# Patient Record
Sex: Female | Born: 1960 | Race: Black or African American | Hispanic: No | Marital: Married | Smoking: Never smoker
Health system: Southern US, Community
[De-identification: ages and names within clinical notes are randomized; demographics above are authoritative.]

## PROBLEM LIST (undated history)

## (undated) DIAGNOSIS — R1110 Vomiting, unspecified: Secondary | ICD-10-CM

## (undated) DIAGNOSIS — I10 Essential (primary) hypertension: Secondary | ICD-10-CM

## (undated) DIAGNOSIS — R634 Abnormal weight loss: Secondary | ICD-10-CM

## (undated) DIAGNOSIS — R531 Weakness: Secondary | ICD-10-CM

## (undated) DIAGNOSIS — E538 Deficiency of other specified B group vitamins: Secondary | ICD-10-CM

## (undated) DIAGNOSIS — E785 Hyperlipidemia, unspecified: Secondary | ICD-10-CM

## (undated) DIAGNOSIS — K8050 Calculus of bile duct without cholangitis or cholecystitis without obstruction: Secondary | ICD-10-CM

## (undated) DIAGNOSIS — D510 Vitamin B12 deficiency anemia due to intrinsic factor deficiency: Secondary | ICD-10-CM

## (undated) DIAGNOSIS — K5900 Constipation, unspecified: Secondary | ICD-10-CM

## (undated) DIAGNOSIS — D649 Anemia, unspecified: Secondary | ICD-10-CM

## (undated) DIAGNOSIS — R109 Unspecified abdominal pain: Secondary | ICD-10-CM

## (undated) HISTORY — DX: Unspecified abdominal pain: R10.9

## (undated) HISTORY — DX: Weakness: R53.1

## (undated) HISTORY — DX: Vomiting, unspecified: R11.10

## (undated) HISTORY — DX: Abnormal weight loss: R63.4

## (undated) HISTORY — DX: Hyperlipidemia, unspecified: E78.5

## (undated) HISTORY — DX: Anemia, unspecified: D64.9

## (undated) HISTORY — PX: WISDOM TOOTH EXTRACTION: SHX21

## (undated) HISTORY — DX: Vitamin B12 deficiency anemia due to intrinsic factor deficiency: D51.0

## (undated) HISTORY — DX: Deficiency of other specified B group vitamins: E53.8

## (undated) HISTORY — DX: Constipation, unspecified: K59.00

## (undated) HISTORY — DX: Calculus of bile duct without cholangitis or cholecystitis without obstruction: K80.50

---

## 1987-04-04 HISTORY — PX: MOUTH SURGERY: SHX715

## 2000-11-08 ENCOUNTER — Encounter: Payer: Self-pay | Admitting: Obstetrics and Gynecology

## 2000-11-08 ENCOUNTER — Encounter: Admission: RE | Admit: 2000-11-08 | Discharge: 2000-11-08 | Payer: Self-pay | Admitting: Obstetrics and Gynecology

## 2000-11-13 HISTORY — PX: KNEE SURGERY: SHX244

## 2000-11-30 ENCOUNTER — Encounter (INDEPENDENT_AMBULATORY_CARE_PROVIDER_SITE_OTHER): Payer: Self-pay | Admitting: Specialist

## 2000-11-30 ENCOUNTER — Other Ambulatory Visit: Admission: RE | Admit: 2000-11-30 | Discharge: 2000-11-30 | Payer: Self-pay | Admitting: Obstetrics and Gynecology

## 2000-12-02 ENCOUNTER — Other Ambulatory Visit: Admission: RE | Admit: 2000-12-02 | Discharge: 2000-12-02 | Payer: Self-pay | Admitting: Orthopaedic Surgery

## 2000-12-28 ENCOUNTER — Encounter: Admission: RE | Admit: 2000-12-28 | Discharge: 2001-03-28 | Payer: Self-pay | Admitting: *Deleted

## 2003-10-12 ENCOUNTER — Other Ambulatory Visit: Admission: RE | Admit: 2003-10-12 | Discharge: 2003-10-12 | Payer: Self-pay | Admitting: Internal Medicine

## 2004-08-07 ENCOUNTER — Encounter: Admission: RE | Admit: 2004-08-07 | Discharge: 2004-08-07 | Payer: Self-pay | Admitting: Internal Medicine

## 2004-09-01 ENCOUNTER — Ambulatory Visit (HOSPITAL_COMMUNITY): Admission: RE | Admit: 2004-09-01 | Discharge: 2004-09-01 | Payer: Self-pay | Admitting: Gastroenterology

## 2004-09-01 ENCOUNTER — Encounter (INDEPENDENT_AMBULATORY_CARE_PROVIDER_SITE_OTHER): Payer: Self-pay | Admitting: *Deleted

## 2004-09-01 HISTORY — PX: COLONOSCOPY: SHX174

## 2004-09-01 HISTORY — PX: UPPER GASTROINTESTINAL ENDOSCOPY: SHX188

## 2004-10-20 ENCOUNTER — Other Ambulatory Visit: Admission: RE | Admit: 2004-10-20 | Discharge: 2004-10-20 | Payer: Self-pay | Admitting: Internal Medicine

## 2004-12-03 ENCOUNTER — Encounter: Admission: RE | Admit: 2004-12-03 | Discharge: 2004-12-03 | Payer: Self-pay | Admitting: Internal Medicine

## 2005-05-26 ENCOUNTER — Emergency Department (HOSPITAL_COMMUNITY): Admission: EM | Admit: 2005-05-26 | Discharge: 2005-05-26 | Payer: Self-pay | Admitting: Emergency Medicine

## 2007-12-01 ENCOUNTER — Encounter: Admission: RE | Admit: 2007-12-01 | Discharge: 2007-12-01 | Payer: Self-pay | Admitting: Family Medicine

## 2010-10-31 NOTE — Op Note (Signed)
NAMETANAJA, GANGER              ACCOUNT NO.:  [id number]   MEDICAL RECORD NO.:  [id number]          PATIENT TYPE:  AMB   LOCATION:  ENDO                         FACILITY:  [facility]   PHYSICIAN:  Danise Edge, M.D.   DATE OF BIRTH:  1961/02/10   DATE OF PROCEDURE:  09/01/2004  DATE OF DISCHARGE:                                 OPERATIVE REPORT   PROCEDURE:  Esophagogastroduodenoscopy, gastric polyp biopsy, small bowel  biopsy and colonoscopy.   PROCEDURE INDICATIONS:  Ms. Vaanya Shambaugh is a 50 year old female born  10-24-1960.  Ms. Leavy has unexplained chronic iron-deficiency  anemia.   Ms. Latif was first diagnosed with iron deficiency anemia and she was 50  years old and tried to give blood to the [employer].  At times she  feels as though she has low energy.  She began having heavier menses after  her first child the reports no abnormal menses.  She denies abdominal pain  or gastrointestinal bleeding.  Despite taking iron, her hemoglobin has not  risen significantly.   On June 27, 2004, white blood cell count 6800, hemoglobin 9.6 g,  normocytic indices and normal platelet count.   MEDICATION ALLERGIES:  PENICILLIN.   CHRONIC MEDICATIONS:  Metformin, Bayer aspirin, lisinopril, iron with  vitamin C, Prilosec.   PAST MEDICAL HISTORY:  Type 2 diabetes mellitus, chronic iron-deficiency  anemia, two cesarean sections, right knee surgery.   BLOOD TRANSFUSIONS:  1988.   ASSESSMENT:  Chronic iron-deficiency anemia in a menstruating female.  The  patient is scheduled to undergo esophagogastroduodenoscopy with small bowel  biopsy to rule out celiac sprue and colonoscopy with polypectomy to prevent  colon cancer.   ENDOSCOPIST:  Danise Edge, M.D.   PREMEDICATION:  Versed 8.5 mg, Demerol 70 mg.   PROCEDURE:  Esophagogastroduodenoscopy.   After obtaining informed consent, Ms. Acero was placed in the left lateral  decubitus position.  I administered  intravenous Demerol and intravenous  Versed to achieve conscious sedation for the procedure.  The patient's blood  pressure, oxygen saturation and cardiac rhythm were monitored throughout the  procedure and documented in the medical record.   The Olympus gastroscope was passed through the posterior hypopharynx into  the proximal esophagus without difficulty.  The hypopharynx, larynx and  vocal cords appeared normal.   Esophagoscopy:  The proximal, mid and lower segments of the esophageal  mucosa appear normal.  The squamocolumnar junction and esophagogastric junction are noted at 40 cm  from the incisor teeth.   Gastroscopy:  Retroflexed view of the gastric fundus was normal.  In the  gastric cardia, there is a 3-mm polyp, which was biopsied.  The gastric  body, antrum and pylorus appear normal.   Duodenoscopy:  The duodenal bulb, second portion and third portion of  duodenum normal.  Five biopsies were taken from the second-third portions of  the duodenum to rule out celiac sprue.   ASSESSMENT:  1.  A small polyp in the gastric cardia was biopsied.  2.  Small bowel biopsies are pending.   PROCEDURE:  Screening colonoscopy.   Anal inspection and  digital rectal exam were normal.  The Olympus adjustable  pediatric colonoscope was introduced into the rectum and advanced to the  cecum.  Colonic preparation for the exam today was excellent.   Rectum normal.  Sigmoid colon and descending colon normal.  Splenic flexure normal.  Transverse colon normal.  Hepatic flexure normal.  Ascending colon normal.  Cecum and ileocecal valve normal.   ASSESSMENT:  Normal proctocolonoscopy to the cecum.      MJ/MEDQ  D:  09/01/2004  T:  09/01/2004  Job:  [id number]   cc:   Marcene Duos, M.D.  [address]  [place]  [place] [address]  Fax: [phone]

## 2011-01-30 ENCOUNTER — Other Ambulatory Visit: Payer: Self-pay | Admitting: Family Medicine

## 2011-01-30 DIAGNOSIS — Z1231 Encounter for screening mammogram for malignant neoplasm of breast: Secondary | ICD-10-CM

## 2011-02-02 ENCOUNTER — Ambulatory Visit
Admission: RE | Admit: 2011-02-02 | Discharge: 2011-02-02 | Disposition: A | Discharge status: Home / Self Care | Payer: BC Managed Care – PPO | Source: Ambulatory Visit | Attending: Family Medicine | Admitting: Family Medicine

## 2011-02-02 DIAGNOSIS — Z1231 Encounter for screening mammogram for malignant neoplasm of breast: Secondary | ICD-10-CM

## 2011-11-19 ENCOUNTER — Encounter (HOSPITAL_COMMUNITY): Payer: Self-pay | Admitting: Family Medicine

## 2011-11-19 ENCOUNTER — Emergency Department (HOSPITAL_COMMUNITY)
Admission: EM | Admit: 2011-11-19 | Discharge: 2011-11-20 | Disposition: A | Discharge status: Home / Self Care | Payer: BC Managed Care – PPO | Attending: Emergency Medicine | Admitting: Emergency Medicine

## 2011-11-19 ENCOUNTER — Emergency Department (HOSPITAL_COMMUNITY): Payer: BC Managed Care – PPO

## 2011-11-19 DIAGNOSIS — R197 Diarrhea, unspecified: Secondary | ICD-10-CM | POA: Insufficient documentation

## 2011-11-19 DIAGNOSIS — E119 Type 2 diabetes mellitus without complications: Secondary | ICD-10-CM | POA: Insufficient documentation

## 2011-11-19 DIAGNOSIS — R109 Unspecified abdominal pain: Secondary | ICD-10-CM

## 2011-11-19 DIAGNOSIS — R110 Nausea: Secondary | ICD-10-CM | POA: Insufficient documentation

## 2011-11-19 DIAGNOSIS — Z79899 Other long term (current) drug therapy: Secondary | ICD-10-CM | POA: Insufficient documentation

## 2011-11-19 DIAGNOSIS — R739 Hyperglycemia, unspecified: Secondary | ICD-10-CM

## 2011-11-19 DIAGNOSIS — I10 Essential (primary) hypertension: Secondary | ICD-10-CM | POA: Insufficient documentation

## 2011-11-19 HISTORY — DX: Essential (primary) hypertension: I10

## 2011-11-19 LAB — DIFFERENTIAL
Basophils Absolute: 0 10*3/uL (ref 0.0–0.1)
Basophils Relative: 0 % (ref 0–1)
Eosinophils Absolute: 0 10*3/uL (ref 0.0–0.7)
Eosinophils Relative: 0 % (ref 0–5)
Lymphocytes Relative: 9 % — ABNORMAL LOW (ref 12–46)

## 2011-11-19 LAB — COMPREHENSIVE METABOLIC PANEL
ALT: 9 U/L (ref 0–35)
AST: 15 U/L (ref 0–37)
Albumin: 4 g/dL (ref 3.5–5.2)
CO2: 26 mEq/L (ref 19–32)
Calcium: 9.8 mg/dL (ref 8.4–10.5)
Sodium: 132 mEq/L — ABNORMAL LOW (ref 135–145)
Total Protein: 8.3 g/dL (ref 6.0–8.3)

## 2011-11-19 LAB — URINE MICROSCOPIC-ADD ON

## 2011-11-19 LAB — URINALYSIS, ROUTINE W REFLEX MICROSCOPIC
Glucose, UA: >1000 mg/dL — AB
Hgb urine dipstick: NEGATIVE
Leukocytes, UA: NEGATIVE
Specific Gravity, Urine: 1.034 — ABNORMAL HIGH (ref 1.005–1.030)
pH: 7.5 (ref 5.0–8.0)

## 2011-11-19 LAB — CBC
MCHC: 33.6 g/dL (ref 30.0–36.0)
MCV: 85.5 fL (ref 78.0–100.0)
Platelets: 271 10*3/uL (ref 150–400)
RDW: 13.8 % (ref 11.5–15.5)
WBC: 7.1 10*3/uL (ref 4.0–10.5)

## 2011-11-19 MED ORDER — HYDROMORPHONE HCL PF 1 MG/ML IJ SOLN
0.5000 mg | Freq: Once | INTRAMUSCULAR | Status: AC
  Administered 2011-11-19: 1 mg via INTRAVENOUS
  Filled 2011-11-19: qty 1

## 2011-11-19 MED ORDER — ONDANSETRON 8 MG PO TBDP: ORAL_TABLET | ORAL | Status: AC

## 2011-11-19 MED ORDER — OXYCODONE-ACETAMINOPHEN 5-325 MG PO TABS: 1.0000 | ORAL_TABLET | Freq: Four times a day (QID) | ORAL | Status: AC | PRN

## 2011-11-19 MED ORDER — SODIUM CHLORIDE 0.9 % IV SOLN: INTRAVENOUS | Status: DC

## 2011-11-19 MED ORDER — ONDANSETRON HCL 4 MG/2ML IJ SOLN
4.0000 mg | Freq: Once | INTRAMUSCULAR | Status: AC
  Administered 2011-11-19: 4 mg via INTRAVENOUS
  Filled 2011-11-19: qty 2

## 2011-11-19 MED ORDER — SODIUM CHLORIDE 0.9 % IV BOLUS (SEPSIS)
1000.0000 mL | Freq: Once | INTRAVENOUS | Status: AC
  Administered 2011-11-19: 1000 mL via INTRAVENOUS

## 2011-11-19 NOTE — ED Notes (Signed)
Pt reports having upper abdominal pain starting after she ate last night. States she had some diarrhea last night and then the pain stopped around 23:00 last night. Reports she went to work today and ate lunch and the pain started after eating again.  Reports some nausea but no vomiting.

## 2011-11-19 NOTE — Discharge Instructions (Signed)
Hyperglycemia Hyperglycemia occurs when the glucose (sugar) in your blood is too high. Hyperglycemia can happen for many reasons, but it most often happens to people who do not know they have diabetes or are not managing their diabetes properly.  CAUSES  Whether you have diabetes or not, there are other causes of hyperglycemia. Hyperglycemia can occur when you have diabetes, but it can also occur in other situations that you might not be as aware of, such as: Diabetes  If you have diabetes and are having problems controlling your blood glucose, hyperglycemia could occur because of some of the following reasons:   Not following your meal plan.   Not taking your diabetes medications or not taking it properly.   Exercising less or doing less activity than you normally do.   Being sick.  Pre-diabetes  This cannot be ignored. Before people develop Type 2 diabetes, they almost always have "pre-diabetes." This is when your blood glucose levels are higher than normal, but not yet high enough to be diagnosed as diabetes. Research has shown that some long-term damage to the body, especially the heart and circulatory system, may already be occurring during pre-diabetes. If you take action to manage your blood glucose when you have pre-diabetes, you may delay or prevent Type 2 diabetes from developing.  Stress  If you have diabetes, you may be "diet" controlled or on oral medications or insulin to control your diabetes. However, you may find that your blood glucose is higher than usual in the hospital whether you have diabetes or not. This is often referred to as "stress hyperglycemia." Stress can elevate your blood glucose. This happens because of hormones put out by the body during times of stress. If stress has been the cause of your high blood glucose, it can be followed regularly by your caregiver. That way he/she can make sure your hyperglycemia does not continue to get worse or progress to diabetes.    Steroids  Steroids are medications that act on the infection fighting system (immune system) to block inflammation or infection. One side effect can be a rise in blood glucose. Most people can produce enough extra insulin to allow for this rise, but for those who cannot, steroids make blood glucose levels go even higher. It is not unusual for steroid treatments to "uncover" diabetes that is developing. It is not always possible to determine if the hyperglycemia will go away after the steroids are stopped. A special blood test called an A1c is sometimes done to determine if your blood glucose was elevated before the steroids were started.  SYMPTOMS  Thirsty.   Frequent urination.   Dry mouth.   Blurred vision.   Tired or fatigue.   Weakness.   Sleepy.   Tingling in feet or leg.  DIAGNOSIS  Diagnosis is made by monitoring blood glucose in one or all of the following ways:  A1c test. This is a chemical found in your blood.   Fingerstick blood glucose monitoring.   Laboratory results.  TREATMENT  First, knowing the cause of the hyperglycemia is important before the hyperglycemia can be treated. Treatment may include, but is not be limited to:  Education.   Change or adjustment in medications.   Change or adjustment in meal plan.   Treatment for an illness, infection, etc.   More frequent blood glucose monitoring.   Change in exercise plan.   Decreasing or stopping steroids.   Lifestyle changes.  HOME CARE INSTRUCTIONS   Test your blood glucose  as directed.   Exercise regularly. Your caregiver will give you instructions about exercise. Pre-diabetes or diabetes which comes on with stress is helped by exercising.   Eat wholesome, balanced meals. Eat often and at regular, fixed times. Your caregiver or nutritionist will give you a meal plan to guide your sugar intake.   Being at an ideal weight is important. If needed, losing as little as 10 to 15 pounds may help  improve blood glucose levels.  SEEK MEDICAL CARE IF:   You have questions about medicine, activity, or diet.   You continue to have symptoms (problems such as increased thirst, urination, or weight gain).  SEEK IMMEDIATE MEDICAL CARE IF:   You are vomiting or have diarrhea.   Your breath smells fruity.   You are breathing faster or slower.   You are very sleepy or incoherent.   You have numbness, tingling, or pain in your feet or hands.   You have chest pain.   Your symptoms get worse even though you have been following your caregiver's orders.   If you have any other questions or concerns.  Document Released: 11/25/2000 Document Revised: 05/21/2011 Document Reviewed: 01/21/2009 [facility] Patient Information 2012 [place], [place].  Your glucose was elevated.  This will need to be followed up by her primary care Dr. medications for pain and nausea. Clear liquids for next 10 hour

## 2011-11-19 NOTE — ED Provider Notes (Signed)
History     CSN: [id number]  Arrival date & time 11/19/11  4098   First MD Initiated Contact with Patient 11/19/11 1855      Chief Complaint  Patient presents with  . Abdominal Pain  . Diarrhea  . Nausea    (Consider location/radiation/quality/duration/timing/severity/associated sxs/prior treatment) HPI... upper abdominal pain after eating last night with associated nausea and diarrhea. No fever or chills or dysuria.  Does not normally complain of abdominal pain. Feeling better now. Nothing makes it better or worse.  Described as cramping.. mild to moderate in intensity.  Remote history of diabetes.  Has not been on meds for 3 years  Past Medical History  Diagnosis Date  . Hypertension   . Diabetes mellitus 12 yrs ago     Past Surgical History  Procedure Date  . Cesarean section   . Wisdom tooth extraction     History reviewed. No pertinent family history.  History  Substance Use Topics  . Smoking status: Never Smoker   . Smokeless tobacco: Not on file  . Alcohol Use: No    OB History    Grav Para Term Preterm Abortions TAB SAB Ect Mult Living                  Review of Systems  All other systems reviewed and are negative.    Allergies  Penicillins  Home Medications   Current Outpatient Rx  Name Route Sig Dispense Refill  . BISMUTH SUBSALICYLATE 262 MG/15ML PO SUSP Oral Take 30 mLs by mouth every 6 (six) hours as needed.    Marland Kitchen MAGNESIUM HYDROXIDE 400 MG/5ML PO SUSP Oral Take 30 mLs by mouth daily as needed.    Marland Kitchen ONDANSETRON 8 MG PO TBDP  8mg  ODT q4 hours prn nausea 8 tablet 0  . OXYCODONE-ACETAMINOPHEN 5-325 MG PO TABS Oral Take 1-2 tablets by mouth every 6 (six) hours as needed for pain. 15 tablet 0    BP 133/76  Pulse 93  Temp 98.1 F (36.7 C)  SpO2 98%  Physical Exam  Nursing note and vitals reviewed. Constitutional: She is oriented to person, place, and time. She appears well-developed and well-nourished.  HENT:  Head: Normocephalic and  atraumatic.  Eyes: Conjunctivae and EOM are normal. Pupils are equal, round, and reactive to light.  Neck: Normal range of motion. Neck supple.  Cardiovascular: Normal rate and regular rhythm.   Pulmonary/Chest: Effort normal and breath sounds normal.  Abdominal: Soft. Bowel sounds are normal.       Slight upper abdominal tenderness  Musculoskeletal: Normal range of motion.  Neurological: She is alert and oriented to person, place, and time.  Skin: Skin is warm and dry.  Psychiatric: She has a normal mood and affect.    ED Course  Procedures (including critical care time)  Labs Reviewed  DIFFERENTIAL - Abnormal; Notable for the following:    Neutrophils Relative 86 (*)    Lymphocytes Relative 9 (*)    All other components within normal limits  COMPREHENSIVE METABOLIC PANEL - Abnormal; Notable for the following:    Sodium 132 (*)    Chloride 94 (*)    Glucose, Bld 369 (*)    All other components within normal limits  URINALYSIS, ROUTINE W REFLEX MICROSCOPIC - Abnormal; Notable for the following:    Specific Gravity, Urine 1.034 (*)    Glucose, UA >1000 (*)    Ketones, ur >80 (*)    All other components within normal limits  CBC  LIPASE, BLOOD  URINE MICROSCOPIC-ADD ON   Dg Abd Acute W/chest  11/19/2011  *RADIOLOGY REPORT*  Clinical Data: Mid abdominal pain and distension.  Diarrhea.  ACUTE ABDOMEN SERIES (ABDOMEN 2 VIEW & CHEST 1 VIEW)  Comparison: No priors.  Findings: Lung volumes are normal.  No consolidative airspace disease.  No pleural effusions.  No pneumothorax.  No pulmonary nodule or mass noted.  Pulmonary vasculature and the cardiomediastinal silhouette are within normal limits.  No pneumoperitoneum.  Supine and upright views of the abdomen demonstrate gas of stool scattered throughout the colon extending to the level of the distal rectum.  There is several nondilated loops of gas-filled small bowel noted.  No pathologic distension of small bowel is definitively  identified.  There are a few small air fluid levels noted on the upright view.  IMPRESSION: 1.  Nonspecific, nonobstructive bowel gas pattern. 2.  No pneumoperitoneum. 3.  No radiographic evidence of acute cardiopulmonary disease.  Original Report Authenticated By: Florencia Reasons, M.D.     1. Hyperglycemia   2. Abdominal pain       MDM  Patient feeling much better. No acute abdomen. Glucose is noted to be evaluated. Not in DKA.  We discussed her elevated glucose. She will followup with her primary care Dr.        Donnetta Hutching, MD 11/19/11 2350

## 2011-11-19 NOTE — ED Notes (Signed)
Denies any pain at present,

## 2011-11-19 NOTE — ED Notes (Signed)
States was diagnosed with diabetes and HTN 12 years ago-- completely stopped medicine for both 2 yrs ago.

## 2011-11-21 ENCOUNTER — Encounter (HOSPITAL_COMMUNITY): Payer: Self-pay | Admitting: Emergency Medicine

## 2011-11-21 ENCOUNTER — Emergency Department (HOSPITAL_COMMUNITY)
Admission: EM | Admit: 2011-11-21 | Discharge: 2011-11-22 | Disposition: A | Discharge status: Home / Self Care | Payer: BC Managed Care – PPO | Attending: Emergency Medicine | Admitting: Emergency Medicine

## 2011-11-21 ENCOUNTER — Emergency Department (HOSPITAL_COMMUNITY): Payer: BC Managed Care – PPO

## 2011-11-21 DIAGNOSIS — I10 Essential (primary) hypertension: Secondary | ICD-10-CM | POA: Insufficient documentation

## 2011-11-21 DIAGNOSIS — E119 Type 2 diabetes mellitus without complications: Secondary | ICD-10-CM | POA: Insufficient documentation

## 2011-11-21 DIAGNOSIS — K8050 Calculus of bile duct without cholangitis or cholecystitis without obstruction: Secondary | ICD-10-CM

## 2011-11-21 DIAGNOSIS — R739 Hyperglycemia, unspecified: Secondary | ICD-10-CM

## 2011-11-21 DIAGNOSIS — Z79899 Other long term (current) drug therapy: Secondary | ICD-10-CM | POA: Insufficient documentation

## 2011-11-21 LAB — COMPREHENSIVE METABOLIC PANEL
AST: 15 U/L (ref 0–37)
Albumin: 4.1 g/dL (ref 3.5–5.2)
BUN: 10 mg/dL (ref 6–23)
CO2: 21 mEq/L (ref 19–32)
Calcium: 9.6 mg/dL (ref 8.4–10.5)
Creatinine, Ser: 0.62 mg/dL (ref 0.50–1.10)
GFR calc non Af Amer: >90 mL/min (ref >90)
Total Bilirubin: 0.7 mg/dL (ref 0.3–1.2)

## 2011-11-21 LAB — POCT I-STAT, CHEM 8
Creatinine, Ser: 0.7 mg/dL (ref 0.50–1.10)
Glucose, Bld: 366 mg/dL — ABNORMAL HIGH (ref 70–99)
HCT: 41 % (ref 36.0–46.0)
Hemoglobin: 13.9 g/dL (ref 12.0–15.0)
Potassium: 3.5 mEq/L (ref 3.5–5.1)
Sodium: 141 mEq/L (ref 135–145)
TCO2: 22 mmol/L (ref 0–100)

## 2011-11-21 LAB — URINALYSIS, ROUTINE W REFLEX MICROSCOPIC
Hgb urine dipstick: NEGATIVE
Leukocytes, UA: NEGATIVE
Protein, ur: NEGATIVE mg/dL
Specific Gravity, Urine: 1.033 — ABNORMAL HIGH (ref 1.005–1.030)
Urobilinogen, UA: 0.2 mg/dL (ref 0.0–1.0)

## 2011-11-21 LAB — POCT I-STAT TROPONIN I

## 2011-11-21 LAB — URINE MICROSCOPIC-ADD ON

## 2011-11-21 LAB — CBC
Hemoglobin: 11.7 g/dL — ABNORMAL LOW (ref 12.0–15.0)
Platelets: 293 10*3/uL (ref 150–400)
RBC: 4.09 MIL/uL (ref 3.87–5.11)
WBC: 5.2 10*3/uL (ref 4.0–10.5)

## 2011-11-21 LAB — PRO B NATRIURETIC PEPTIDE: Pro B Natriuretic peptide (BNP): 28.8 pg/mL (ref 0–125)

## 2011-11-21 LAB — LIPASE, BLOOD: Lipase: 30 U/L (ref 11–59)

## 2011-11-21 MED ORDER — ONDANSETRON HCL 4 MG/2ML IJ SOLN
4.0000 mg | Freq: Once | INTRAMUSCULAR | Status: AC
  Administered 2011-11-21: 4 mg via INTRAVENOUS
  Filled 2011-11-21: qty 2

## 2011-11-21 MED ORDER — MORPHINE SULFATE 4 MG/ML IJ SOLN
4.0000 mg | Freq: Once | INTRAMUSCULAR | Status: AC
  Administered 2011-11-21: 4 mg via INTRAVENOUS
  Filled 2011-11-21: qty 1

## 2011-11-21 MED ORDER — NITROGLYCERIN 0.4 MG SL SUBL
0.4000 mg | SUBLINGUAL_TABLET | SUBLINGUAL | Status: DC | PRN
  Administered 2011-11-21 (×2): 0.4 mg via SUBLINGUAL
  Filled 2011-11-21: qty 25

## 2011-11-21 MED ORDER — ASPIRIN 81 MG PO CHEW
324.0000 mg | CHEWABLE_TABLET | Freq: Once | ORAL | Status: AC
  Administered 2011-11-21: 324 mg via ORAL
  Filled 2011-11-21: qty 4

## 2011-11-21 NOTE — ED Notes (Signed)
RN to obtain labs with start of IV 

## 2011-11-21 NOTE — ED Notes (Signed)
Per Pt: intermittent recurrent severe mid abdominal pain and nausea since Wednesday at 9pm. Was seen here for same. No urinary symptoms. LBM Friday, wnl.

## 2011-11-22 ENCOUNTER — Emergency Department (HOSPITAL_COMMUNITY): Payer: BC Managed Care – PPO

## 2011-11-22 MED ORDER — OXYCODONE HCL 5 MG PO TABS: 5.0000 mg | ORAL_TABLET | ORAL | Status: AC | PRN

## 2011-11-22 MED ORDER — METFORMIN HCL 500 MG PO TABS: 500.0000 mg | ORAL_TABLET | Freq: Two times a day (BID) | ORAL | Status: DC

## 2011-11-22 NOTE — Discharge Instructions (Signed)
Cholelithiasis Cholelithiasis (also called gallstones) is a form of gallbladder disease where gallstones form in your gallbladder. The gallbladder is a non-essential organ that stores bile made in the liver, which helps digest fats. Gallstones begin as small crystals and slowly grow into stones. Gallstone pain occurs when the gallbladder spasms, and a gallstone is blocking the duct. Pain can also occur when a stone passes out of the duct.  Women are more likely to develop gallstones than men. Other factors that increase the risk of gallbladder disease are:  Having multiple pregnancies. Physicians sometimes advise removing diseased gallbladders before future pregnancies.   Obesity.   Diets heavy in fried foods and fat.   Increasing age (older than 35).   Prolonged use of medications containing female hormones.   Diabetes mellitus.   Rapid weight loss.   Family history of gallstones (heredity).  SYMPTOMS  Feeling sick to your stomach (nauseous).   Abdominal pain.   Yellowing of the skin (jaundice).   Sudden pain. It may persist from several minutes to several hours.   Worsening pain with deep breathing or when jarred.   Fever.   Tenderness to the touch.  In some cases, when gallstones do not move into the bile duct, people have no pain or symptoms. These are called "silent" gallstones. TREATMENT In severe cases, emergency surgery may be required. HOME CARE INSTRUCTIONS   Only take over-the-counter or prescription medicines for pain, discomfort, or fever as directed by your caregiver.   Follow a low-fat diet until seen again. Fat causes the gallbladder to contract, which can result in pain.   Follow up as instructed. Attacks are almost always recurrent and surgery is usually required for permanent treatment.  SEEK IMMEDIATE MEDICAL CARE IF:   Your pain increases and is not controlled by medications.   You have an oral temperature above 102 F (38.9 C), not controlled by  medication.   You develop nausea and vomiting.  MAKE SURE YOU:   Understand these instructions.   Will watch your condition.   Will get help right away if you are not doing well or get worse.  Document Released: 05/28/2005 Document Revised: 05/21/2011 Document Reviewed: 07/31/2010 [facility] Patient Information 2012 [place], [place].Hyperglycemia Hyperglycemia occurs when the glucose (sugar) in your blood is too high. Hyperglycemia can happen for many reasons, but it most often happens to people who do not know they have diabetes or are not managing their diabetes properly.  CAUSES  Whether you have diabetes or not, there are other causes of hyperglycemia. Hyperglycemia can occur when you have diabetes, but it can also occur in other situations that you might not be as aware of, such as: Diabetes  If you have diabetes and are having problems controlling your blood glucose, hyperglycemia could occur because of some of the following reasons:   Not following your meal plan.   Not taking your diabetes medications or not taking it properly.   Exercising less or doing less activity than you normally do.   Being sick.  Pre-diabetes  This cannot be ignored. Before people develop Type 2 diabetes, they almost always have "pre-diabetes." This is when your blood glucose levels are higher than normal, but not yet high enough to be diagnosed as diabetes. Research has shown that some long-term damage to the body, especially the heart and circulatory system, may already be occurring during pre-diabetes. If you take action to manage your blood glucose when you have pre-diabetes, you may delay or prevent Type 2 diabetes  from developing.  Stress  If you have diabetes, you may be "diet" controlled or on oral medications or insulin to control your diabetes. However, you may find that your blood glucose is higher than usual in the hospital whether you have diabetes or not. This is often referred to as "stress  hyperglycemia." Stress can elevate your blood glucose. This happens because of hormones put out by the body during times of stress. If stress has been the cause of your high blood glucose, it can be followed regularly by your caregiver. That way he/she can make sure your hyperglycemia does not continue to get worse or progress to diabetes.  Steroids  Steroids are medications that act on the infection fighting system (immune system) to block inflammation or infection. One side effect can be a rise in blood glucose. Most people can produce enough extra insulin to allow for this rise, but for those who cannot, steroids make blood glucose levels go even higher. It is not unusual for steroid treatments to "uncover" diabetes that is developing. It is not always possible to determine if the hyperglycemia will go away after the steroids are stopped. A special blood test called an A1c is sometimes done to determine if your blood glucose was elevated before the steroids were started.  SYMPTOMS  Thirsty.   Frequent urination.   Dry mouth.   Blurred vision.   Tired or fatigue.   Weakness.   Sleepy.   Tingling in feet or leg.  DIAGNOSIS  Diagnosis is made by monitoring blood glucose in one or all of the following ways:  A1c test. This is a chemical found in your blood.   Fingerstick blood glucose monitoring.   Laboratory results.  TREATMENT  First, knowing the cause of the hyperglycemia is important before the hyperglycemia can be treated. Treatment may include, but is not be limited to:  Education.   Change or adjustment in medications.   Change or adjustment in meal plan.   Treatment for an illness, infection, etc.   More frequent blood glucose monitoring.   Change in exercise plan.   Decreasing or stopping steroids.   Lifestyle changes.  HOME CARE INSTRUCTIONS   Test your blood glucose as directed.   Exercise regularly. Your caregiver will give you instructions about  exercise. Pre-diabetes or diabetes which comes on with stress is helped by exercising.   Eat wholesome, balanced meals. Eat often and at regular, fixed times. Your caregiver or nutritionist will give you a meal plan to guide your sugar intake.   Being at an ideal weight is important. If needed, losing as little as 10 to 15 pounds may help improve blood glucose levels.  SEEK MEDICAL CARE IF:   You have questions about medicine, activity, or diet.   You continue to have symptoms (problems such as increased thirst, urination, or weight gain).  SEEK IMMEDIATE MEDICAL CARE IF:   You are vomiting or have diarrhea.   Your breath smells fruity.   You are breathing faster or slower.   You are very sleepy or incoherent.   You have numbness, tingling, or pain in your feet or hands.   You have chest pain.   Your symptoms get worse even though you have been following your caregiver's orders.   If you have any other questions or concerns.  Document Released: 11/25/2000 Document Revised: 05/21/2011 Document Reviewed: 01/21/2009 [facility] Patient Information 2012 [place], [place].

## 2011-11-22 NOTE — ED Notes (Signed)
Patient given discharge instructions, information, prescriptions, and diet order. Patient states that they adequately understand discharge information given and to return to ED if symptoms return or worsen.     

## 2011-11-22 NOTE — ED Provider Notes (Signed)
History     CSN: [id number]  Arrival date & time 11/21/11  2044   First MD Initiated Contact with Patient 11/21/11 2300      Chief Complaint  Patient presents with  . Abdominal Pain     The history is provided by the patient and medical records.   the patient reports development of epigastric abdominal pain that has been coming and going for the past days worsened this evening.  It began approximately 3-4 days ago.  She was seen in the emergency department reports that she did feel slightly better after leaving the emergency department but has had return of her symptoms today.  Her pain in her upper abdomen seems to worsen with food.  She still has her gallbladder.  She's never had pancreatitis before.  She denies alcohol abuse.  Her pain is worsened by food and with palpation.  Nothing improves her pain.  Her pain now is constant and moderate in severity.  She denies fevers or chills .  She denies dysuria or urinary frequency.  She's had no new vaginal complaints. She has a history of hypertension.  She's compliant with her medications.  She's found the other day in the emergency department to have elevated blood sugars but was not started any medications.   Past Medical History  Diagnosis Date  . Hypertension   . Diabetes mellitus 12 yrs ago     Past Surgical History  Procedure Date  . Cesarean section   . Wisdom tooth extraction     History reviewed. No pertinent family history.  History  Substance Use Topics  . Smoking status: Never Smoker   . Smokeless tobacco: Not on file  . Alcohol Use: No    OB History    Grav Para Term Preterm Abortions TAB SAB Ect Mult Living                  Review of Systems  Gastrointestinal: Positive for abdominal pain.  All other systems reviewed and are negative.    Allergies  Penicillins  Home Medications   Current Outpatient Rx  Name Route Sig Dispense Refill  . BISMUTH SUBSALICYLATE 262 MG/15ML PO SUSP Oral Take 30 mLs by  mouth every 6 (six) hours as needed.    Marland Kitchen MAGNESIUM HYDROXIDE 400 MG/5ML PO SUSP Oral Take 30 mLs by mouth daily as needed.    Marland Kitchen ONDANSETRON 8 MG PO TBDP  8mg  ODT q4 hours prn nausea 8 tablet 0  . OXYCODONE-ACETAMINOPHEN 5-325 MG PO TABS Oral Take 1-2 tablets by mouth every 6 (six) hours as needed for pain. 15 tablet 0    BP 165/81  Pulse 65  Temp(Src) 99.7 F (37.6 C) (Rectal)  Resp 18  Wt 132 lb 4.8 oz (60.011 kg)  SpO2 100%  Physical Exam  Nursing note and vitals reviewed. Constitutional: She is oriented to person, place, and time. She appears well-developed and well-nourished. No distress.  HENT:  Head: Normocephalic and atraumatic.  Eyes: EOM are normal.  Neck: Normal range of motion.  Cardiovascular: Normal rate, regular rhythm and normal heart sounds.   Pulmonary/Chest: Effort normal and breath sounds normal.  Abdominal: Soft. She exhibits no distension.       Epigastric tenderness without guarding or rebound  Musculoskeletal: Normal range of motion.  Neurological: She is alert and oriented to person, place, and time.  Skin: Skin is warm and dry.  Psychiatric: She has a normal mood and affect. Judgment normal.    ED Course  Procedures (including critical care time)  Labs Reviewed  COMPREHENSIVE METABOLIC PANEL - Abnormal; Notable for the following:    Potassium 3.3 (*)    Glucose, Bld 352 (*)    All other components within normal limits  URINALYSIS, ROUTINE W REFLEX MICROSCOPIC - Abnormal; Notable for the following:    Specific Gravity, Urine 1.033 (*)    Glucose, UA >1000 (*)    Ketones, ur >80 (*)    All other components within normal limits  POCT I-STAT, CHEM 8 - Abnormal; Notable for the following:    Glucose, Bld 366 (*)    All other components within normal limits  CBC - Abnormal; Notable for the following:    Hemoglobin 11.7 (*)    HCT 35.5 (*)    All other components within normal limits  PRO B NATRIURETIC PEPTIDE  LIPASE, BLOOD  POCT I-STAT  TROPONIN I  URINE MICROSCOPIC-ADD ON   US Abdomen Complete  11/22/2011  *RADIOLOGY REPORT*  Clinical Data:  Mid abdominal pain  COMPLETE ABDOMINAL ULTRASOUND  Comparison:  None.  Findings:  Gallbladder:  Cholelithiasis.  No gallbladder wall thickening or pericholecystic fluid.  Negative sonographic Murphy's sign.  Common bile duct:  Measures 4 mm.  Liver:  No focal lesion identified.  Within normal limits in parenchymal echogenicity.  IVC:  Appears normal.  Pancreas:  Incompletely visualized but grossly unremarkable.  Spleen:  Measures 6.7 cm.  Right Kidney:  Measures 11.4 cm.  7 mm upper pole cyst.  No hydronephrosis.  Left Kidney:  Mass or hydronephrosis.  Abdominal aorta:  No aneurysm identified.  IMPRESSION: Cholelithiasis, without associated findings to suggest acute cholecystitis.  Original Report Authenticated By: Charline Bills, M.D.   Dg Chest Portable 1 View  11/21/2011  *RADIOLOGY REPORT*  Clinical Data: Abdominal pain  PORTABLE CHEST - 1 VIEW  Comparison: 08/07/2004  Findings: Lungs are clear. No pleural effusion or pneumothorax.  Cardiomediastinal silhouette is within normal limits.  IMPRESSION: No evidence of acute cardiopulmonary disease.  Original Report Authenticated By: Charline Bills, M.D.     1. Biliary colic   2. Hyperglycemia       MDM  The patient's symptoms are consistent with biliary colic.  She has no signs of suggest cholecystitis.  Her pain is controlled at time of discharge.  She feels much better.  She will followup with the general surgeons.  She understands return to the ER for worsening abdominal pain severe nausea vomiting and inability to keep any fluids down or fever.  I suspect the patient is a new onset diabetic as well as her blood sugars 366.  Hemoglobin A1c was sent.  The patient will be discharged home with metformin and told to followup with her primary care physician for additional workup.        Lyanne Co, MD 11/22/11 [id number]

## 2011-12-15 ENCOUNTER — Encounter (INDEPENDENT_AMBULATORY_CARE_PROVIDER_SITE_OTHER): Payer: Self-pay | Admitting: Surgery

## 2011-12-15 ENCOUNTER — Ambulatory Visit (INDEPENDENT_AMBULATORY_CARE_PROVIDER_SITE_OTHER): Payer: BC Managed Care – PPO | Admitting: Surgery

## 2011-12-15 VITALS — BP 116/74 | HR 68 | Temp 97.4°F | Resp 14 | Ht 65.0 in | Wt 123.2 lb

## 2011-12-15 DIAGNOSIS — K8020 Calculus of gallbladder without cholecystitis without obstruction: Secondary | ICD-10-CM

## 2011-12-15 DIAGNOSIS — K8050 Calculus of bile duct without cholangitis or cholecystitis without obstruction: Secondary | ICD-10-CM

## 2011-12-15 NOTE — Progress Notes (Signed)
Patient ID: Sherry Hart, female   DOB: 02-11-1961, 51 y.o.   MRN: [id number]  Chief Complaint  Patient presents with  . Abdominal Pain    Biliary colic    HPI Sherry Hart is a 51 y.o. female.   HPIPatient sent at the request of Dr. Rubin Payor due to epigastric abdominal pain, nausea and vomiting. This has been going on for a month. She was found to be profoundly hyperglycemic in the emergency room one month ago with very poor control or diabetes. She was found to have gallstones without cholecystitis. She is improved her blood glucose control. She had over the weekend some mild epigastric tenderness but was constipated and her pain went away with a bowel movement after taking a laxative. She has no fever or chills. She denies any abdominal pain today and is hungry. She has had a good appetite. Denies any change in the color of her stool.  Past Medical History  Diagnosis Date  . Hypertension   . Diabetes mellitus 12 yrs ago   . Biliary colic   . Anemia   . Constipation   . Unexplained weight loss   . Abdominal pain   . Vomiting   . Weakness     Past Surgical History  Procedure Date  . Wisdom tooth extraction   . Cesarean section 11/29/1990, 04/06/1994  . Knee surgery 11/2000    left  . Mouth surgery 04/04/1987    Family History  Problem Relation Age of Onset  . Cancer Mother     breast  . Diabetes Father     Social History History  Substance Use Topics  . Smoking status: Never Smoker   . Smokeless tobacco: Never Used  . Alcohol Use: No    Allergies  Allergen Reactions  . Penicillins Hives    Back of neck and upper back.    Current Outpatient Prescriptions  Medication Sig Dispense Refill  . bismuth subsalicylate (PEPTO BISMOL) 262 MG/15ML suspension Take 30 mLs by mouth every 6 (six) hours as needed.      . insulin glargine (LANTUS) 100 UNIT/ML injection Inject 12 Units into the skin daily.      . magnesium hydroxide (MILK OF MAGNESIA) 400 MG/5ML  suspension Take 30 mLs by mouth daily as needed.      . metFORMIN (GLUCOPHAGE) 500 MG tablet Take 1 tablet (500 mg total) by mouth 2 (two) times daily with a meal.  60 tablet  1  . OXYCODONE HCL PO Take 40 mg by mouth as needed.        Review of Systems Review of Systems  Constitutional: Positive for appetite change and fatigue.  HENT: Negative.   Eyes: Negative.   Respiratory: Negative.   Cardiovascular: Negative.   Gastrointestinal: Negative.   Genitourinary: Negative.   Musculoskeletal: Negative.   Skin: Negative.   Neurological: Negative.   Hematological: Negative.   Psychiatric/Behavioral: Negative.     Blood pressure 116/74, pulse 68, temperature 97.4 F (36.3 C), temperature source Temporal, resp. rate 14, height 5\' 5"  (1.651 m), weight 123 lb 4 oz (55.906 kg).  Physical Exam Physical Exam  Constitutional: She is oriented to person, place, and time. She appears well-developed and well-nourished.  HENT:  Head: Normocephalic and atraumatic.  Eyes: EOM are normal. Pupils are equal, round, and reactive to light.  Neck: Normal range of motion. Neck supple.  Cardiovascular: Normal rate and regular rhythm.   Pulmonary/Chest: Effort normal and breath sounds normal.  Abdominal: Soft. Bowel sounds  are normal. She exhibits no distension. There is no tenderness.  Musculoskeletal: Normal range of motion.  Neurological: She is alert and oriented to person, place, and time.  Skin: Skin is warm and dry.  Psychiatric: She has a normal mood and affect. Her behavior is normal. Judgment and thought content normal.    Data Reviewed Labs and u/s 6/62013 gallstones without cholecystitis  Normal WBC.  Assessment    Cholelithiasis  Type 2 diabetes mellitus with poor control    Plan    I discussed laparoscopic cholecystectomy with the patient and her husband today. Her diabetes control is concerning but seems to be improving on her new medical regimen. She is reluctant to proceed  with cholecystectomy at this point in time. I discussed the pros and cons of surgery with her and her  husband today as well alternative therapies. It is possible that the hyperglycemia can  cause similar  symptomatology in the past so getting her blood glucose under control I think is important. I do think she will eventually require cholecystectomy in the near future. In the meantime, I recommend a low-fat diet and have  given her information about gallstones and gallbladder surgery. She will return in one month or sooner if  symptoms recur to further discuss surgery.        Keymoni Mccaster A. 12/15/2011, 4:12 PM

## 2011-12-15 NOTE — Patient Instructions (Signed)
Laparoscopic Cholecystectomy Laparoscopic cholecystectomy is surgery to remove the gallbladder. The gallbladder is located slightly to the right of center in the abdomen, behind the liver. It is a concentrating and storage sac for the bile produced in the liver. Bile aids in the digestion and absorption of fats. Gallbladder disease (cholecystitis) is an inflammation of your gallbladder. This condition is usually caused by a buildup of gallstones (cholelithiasis) in your gallbladder. Gallstones can block the flow of bile, resulting in inflammation and pain. In severe cases, emergency surgery may be required. When emergency surgery is not required, you will have time to prepare for the procedure. Laparoscopic surgery is an alternative to open surgery. Laparoscopic surgery usually has a shorter recovery time. Your common bile duct may also need to be examined and explored. Your caregiver will discuss this with you if he or she feels this should be done. If stones are found in the common bile duct, they may be removed. LET YOUR CAREGIVER KNOW ABOUT:  Allergies to food or medicine.   Medicines taken, including vitamins, herbs, eyedrops, over-the-counter medicines, and creams.   Use of steroids (by mouth or creams).   Previous problems with anesthetics or numbing medicines.   History of bleeding problems or blood clots.   Previous surgery.   Other health problems, including diabetes and kidney problems.   Possibility of pregnancy, if this applies.  RISKS AND COMPLICATIONS All surgery is associated with risks. Some problems that may occur following this procedure include:  Infection.   Damage to the common bile duct, nerves, arteries, veins, or other internal organs such as the stomach or intestines.   Bleeding.   A stone may remain in the common bile duct.  BEFORE THE PROCEDURE  Do not take aspirin for 3 days prior to surgery or blood thinners for 1 week prior to surgery.   Do not eat or  drink anything after midnight the night before surgery.   Let your caregiver know if you develop a cold or other infectious problem prior to surgery.   You should be present 60 minutes before the procedure or as directed.  PROCEDURE  You will be given medicine that makes you sleep (general anesthetic). When you are asleep, your surgeon will make several small cuts (incisions) in your abdomen. One of these incisions is used to insert a small, lighted scope (laparoscope) into the abdomen. The laparoscope helps the surgeon see into your abdomen. Carbon dioxide gas will be pumped into your abdomen. The gas allows more room for the surgeon to perform your surgery. Other operating instruments are inserted through the other incisions. Laparoscopic procedures may not be appropriate when:  There is major scarring from previous surgery.   The gallbladder is extremely inflamed.   There are bleeding disorders or unexpected cirrhosis of the liver.   A pregnancy is near term.   Other conditions make the laparoscopic procedure impossible.  If your surgeon feels it is not safe to continue with a laparoscopic procedure, he or she will perform an open abdominal procedure. In this case, the surgeon will make an incision to open the abdomen. This gives the surgeon a larger view and field to work within. This may allow the surgeon to perform procedures that sometimes cannot be performed with a laparoscope alone. Open surgery has a longer recovery time. AFTER THE PROCEDURE  You will be taken to the recovery area where a nurse will watch and check your progress.   You may be allowed to go home   the same day.   Do not resume physical activities until directed by your caregiver.   You may resume a normal diet and activities as directed.  Document Released: 06/01/2005 Document Revised: 05/21/2011 Document Reviewed: 11/14/2010 [facility] Patient Information 2012 [place], [place].Cholelithiasis Cholelithiasis (also  called gallstones) is a form of gallbladder disease where gallstones form in your gallbladder. The gallbladder is a non-essential organ that stores bile made in the liver, which helps digest fats. Gallstones begin as small crystals and slowly grow into stones. Gallstone pain occurs when the gallbladder spasms, and a gallstone is blocking the duct. Pain can also occur when a stone passes out of the duct.  Women are more likely to develop gallstones than men. Other factors that increase the risk of gallbladder disease are:  Having multiple pregnancies. Physicians sometimes advise removing diseased gallbladders before future pregnancies.   Obesity.   Diets heavy in fried foods and fat.   Increasing age (older than 52).   Prolonged use of medications containing female hormones.   Diabetes mellitus.   Rapid weight loss.   Family history of gallstones (heredity).  SYMPTOMS  Feeling sick to your stomach (nauseous).   Abdominal pain.   Yellowing of the skin (jaundice).   Sudden pain. It may persist from several minutes to several hours.   Worsening pain with deep breathing or when jarred.   Fever.   Tenderness to the touch.  In some cases, when gallstones do not move into the bile duct, people have no pain or symptoms. These are called "silent" gallstones. TREATMENT In severe cases, emergency surgery may be required. HOME CARE INSTRUCTIONS   Only take over-the-counter or prescription medicines for pain, discomfort, or fever as directed by your caregiver.   Follow a low-fat diet until seen again. Fat causes the gallbladder to contract, which can result in pain.   Follow up as instructed. Attacks are almost always recurrent and surgery is usually required for permanent treatment.  SEEK IMMEDIATE MEDICAL CARE IF:   Your pain increases and is not controlled by medications.   You have an oral temperature above 102 F (38.9 C), not controlled by medication.   You develop nausea  and vomiting.  MAKE SURE YOU:   Understand these instructions.   Will watch your condition.   Will get help right away if you are not doing well or get worse.  Document Released: 05/28/2005 Document Revised: 05/21/2011 Document Reviewed: 07/31/2010 [facility] Patient Information 2012 [place], [place].Cholesterol Control Diet Cholesterol levels in your body are determined significantly by your diet. Cholesterol levels may also be related to heart disease. The following material helps to explain this relationship and discusses what you can do to help keep your heart healthy. Not all cholesterol is bad. Low-density lipoprotein (LDL) cholesterol is the "bad" cholesterol. It may cause fatty deposits to build up inside your arteries. High-density lipoprotein (HDL) cholesterol is "good." It helps to remove the "bad" LDL cholesterol from your blood. Cholesterol is a very important risk factor for heart disease. Other risk factors are high blood pressure, smoking, stress, heredity, and weight. The heart muscle gets its supply of blood through the coronary arteries. If your LDL cholesterol is high and your HDL cholesterol is low, you are at risk for having fatty deposits build up in your coronary arteries. This leaves less room through which blood can flow. Without sufficient blood and oxygen, the heart muscle cannot function properly and you may feel chest pains (angina pectoris). When a coronary artery closes up  entirely, a part of the heart muscle may die, causing a heart attack (myocardial infarction). CHECKING CHOLESTEROL When your caregiver sends your blood to a lab to be analyzed for cholesterol, a complete lipid (fat) profile may be done. With this test, the total amount of cholesterol and levels of LDL and HDL are determined. Triglycerides are a type of fat that circulates in the blood and can also be used to determine heart disease risk. The list below describes what the numbers should be: Test: Total  Cholesterol.  Less than 200 mg/dl.  Test: LDL "bad cholesterol."  Less than 100 mg/dl.   Less than 70 mg/dl if you are at very high risk of a heart attack or sudden cardiac death.  Test: HDL "good cholesterol."  Greater than 50 mg/dl for women.   Greater than 40 mg/dl for men.  Test: Triglycerides.  Less than 150 mg/dl.  CONTROLLING CHOLESTEROL WITH DIET Although exercise and lifestyle factors are important, your diet is key. That is because certain foods are known to raise cholesterol and others to lower it. The goal is to balance foods for their effect on cholesterol and more importantly, to replace saturated and trans fat with other types of fat, such as monounsaturated fat, polyunsaturated fat, and omega-3 fatty acids. On average, a person should consume no more than 15 to 17 g of saturated fat daily. Saturated and trans fats are considered "bad" fats, and they will raise LDL cholesterol. Saturated fats are primarily found in animal products such as meats, butter, and cream. However, that does not mean you need to sacrifice all your favorite foods. Today, there are good tasting, low-fat, low-cholesterol substitutes for most of the things you like to eat. Choose low-fat or nonfat alternatives. Choose round or loin cuts of red meat, since these types of cuts are lowest in fat and cholesterol. Chicken (without the skin), fish, veal, and ground [place] breast are excellent choices. Eliminate fatty meats, such as hot dogs and salami. Even shellfish have little or no saturated fat. Have a 3 oz (85 g) portion when you eat lean meat, poultry, or fish. Trans fats are also called "partially hydrogenated oils." They are oils that have been scientifically manipulated so that they are solid at room temperature resulting in a longer shelf life and improved taste and texture of foods in which they are added. Trans fats are found in stick margarine, some tub margarines, cookies, crackers, and baked goods.    When baking and cooking, oils are an excellent substitute for butter. The monounsaturated oils are especially beneficial since it is believed they lower LDL and raise HDL. The oils you should avoid entirely are saturated tropical oils, such as coconut and palm.  Remember to eat liberally from food groups that are naturally free of saturated and trans fat, including fish, fruit, vegetables, beans, grains (barley, rice, couscous, bulgur wheat), and pasta (without cream sauces).  IDENTIFYING FOODS THAT LOWER CHOLESTEROL  Soluble fiber may lower your cholesterol. This type of fiber is found in fruits such as apples, vegetables such as broccoli, potatoes, and carrots, legumes such as beans, peas, and lentils, and grains such as barley. Foods fortified with plant sterols (phytosterol) may also lower cholesterol. You should eat at least 2 g per day of these foods for a cholesterol lowering effect.  Read package labels to identify low-saturated fats, trans fats free, and low-fat foods at the supermarket. Select cheeses that have only 2 to 3 g saturated fat per ounce. Use a  heart-healthy tub margarine that is free of trans fats or partially hydrogenated oil. When buying baked goods (cookies, crackers), avoid partially hydrogenated oils. Breads and muffins should be made from whole grains (whole-wheat or whole oat flour, instead of "flour" or "enriched flour"). Buy non-creamy canned soups with reduced salt and no added fats.  FOOD PREPARATION TECHNIQUES  Never deep-fry. If you must fry, either stir-fry, which uses very little fat, or use non-stick cooking sprays. When possible, broil, bake, or roast meats, and steam vegetables. Instead of dressing vegetables with butter or margarine, use lemon and herbs, applesauce and cinnamon (for squash and sweet potatoes), nonfat yogurt, salsa, and low-fat dressings for salads.  LOW-SATURATED FAT / LOW-FAT FOOD SUBSTITUTES Meats / Saturated Fat (g)  Avoid: Steak, marbled (3  oz/85 g) / 11 g   Choose: Steak, lean (3 oz/85 g) / 4 g   Avoid: Hamburger (3 oz/85 g) / 7 g   Choose: Hamburger, lean (3 oz/85 g) / 5 g   Avoid: Ham (3 oz/85 g) / 6 g   Choose: Ham, lean cut (3 oz/85 g) / 2.4 g   Avoid: Chicken, with skin, dark meat (3 oz/85 g) / 4 g   Choose: Chicken, skin removed, dark meat (3 oz/85 g) / 2 g   Avoid: Chicken, with skin, light meat (3 oz/85 g) / 2.5 g   Choose: Chicken, skin removed, light meat (3 oz/85 g) / 1 g  Dairy / Saturated Fat (g)  Avoid: Whole milk (1 cup) / 5 g   Choose: Low-fat milk, 2% (1 cup) / 3 g   Choose: Low-fat milk, 1% (1 cup) / 1.5 g   Choose: Skim milk (1 cup) / 0.3 g   Avoid: Hard cheese (1 oz/28 g) / 6 g   Choose: Skim milk cheese (1 oz/28 g) / 2 to 3 g   Avoid: Cottage cheese, 4% fat (1 cup) / 6.5 g   Choose: Low-fat cottage cheese, 1% fat (1 cup) / 1.5 g   Avoid: Ice cream (1 cup) / 9 g   Choose: Sherbet (1 cup) / 2.5 g   Choose: Nonfat frozen yogurt (1 cup) / 0.3 g   Choose: Frozen fruit bar / trace   Avoid: Whipped cream (1 tbs) / 3.5 g   Choose: Nondairy whipped topping (1 tbs) / 1 g  Condiments / Saturated Fat (g)  Avoid: Mayonnaise (1 tbs) / 2 g   Choose: Low-fat mayonnaise (1 tbs) / 1 g   Avoid: Butter (1 tbs) / 7 g   Choose: Extra light margarine (1 tbs) / 1 g   Avoid: Coconut oil (1 tbs) / 11.8 g   Choose: Olive oil (1 tbs) / 1.8 g   Choose: Corn oil (1 tbs) / 1.7 g   Choose: Safflower oil (1 tbs) / 1.2 g   Choose: Sunflower oil (1 tbs) / 1.4 g   Choose: Soybean oil (1 tbs) / 2.4 g   Choose: Canola oil (1 tbs) / 1 g  Document Released: 06/01/2005 Document Revised: 05/21/2011 Document Reviewed: 11/20/2010 [facility] Patient Information 2012 [place], [place].

## 2011-12-29 ENCOUNTER — Encounter (INDEPENDENT_AMBULATORY_CARE_PROVIDER_SITE_OTHER): Payer: Self-pay

## 2012-01-21 ENCOUNTER — Ambulatory Visit (INDEPENDENT_AMBULATORY_CARE_PROVIDER_SITE_OTHER): Payer: BC Managed Care – PPO | Admitting: Surgery

## 2012-01-21 ENCOUNTER — Encounter (INDEPENDENT_AMBULATORY_CARE_PROVIDER_SITE_OTHER): Payer: Self-pay | Admitting: Surgery

## 2012-01-21 VITALS — BP 148/80 | HR 72 | Temp 97.9°F | Resp 16 | Ht 65.0 in | Wt 121.4 lb

## 2012-01-21 DIAGNOSIS — R109 Unspecified abdominal pain: Secondary | ICD-10-CM | POA: Insufficient documentation

## 2012-01-21 NOTE — Patient Instructions (Signed)
Return if symptoms worsen

## 2012-01-21 NOTE — Progress Notes (Signed)
Subjective:     Patient ID: Sherry Hart, female   DOB: 10-16-1960, 51 y.o.   MRN: [id number]  HPIpatient returns in followup of her gallstones. She has since calmed her blood sugars under better control and watch her diet and is feeling better. She has been under a lot of stress with her husband losing his job and daughter going to college. She has had no further epigastric abdominal pain nausea or vomiting especially since her metformin has been adjusted.   Review of Systems  Respiratory: Negative.   Cardiovascular: Negative.   Gastrointestinal: Negative for nausea, abdominal pain and abdominal distention.  Psychiatric/Behavioral: The patient is nervous/anxious.        Objective:   Physical Exam  Constitutional: She appears well-developed and well-nourished.  HENT:  Head: Normocephalic and atraumatic.  Abdominal: Soft. Bowel sounds are normal. She exhibits no distension. There is no tenderness.  Skin: Skin is warm and dry.  Psychiatric: She has a normal mood and affect. Her behavior is normal. Judgment and thought content normal.       Assessment:     Gallstones minimally symptomatic    Plan:     Patient is doing well on her medications. It's unclear to me if her  abdominal pain was from  her gallstones or from her diabetes being poorly controlled. She feels better with better control and therefore we'll continue to follow her gallstones. She will contact me as she is any more abdominal pain nausea or vomiting but it looks like she's doing well.

## 2013-01-05 ENCOUNTER — Other Ambulatory Visit: Payer: Self-pay | Admitting: Family Medicine

## 2013-03-04 ENCOUNTER — Other Ambulatory Visit: Payer: Self-pay | Admitting: Family Medicine

## 2013-03-06 ENCOUNTER — Encounter: Payer: Self-pay | Admitting: Family Medicine

## 2013-03-06 NOTE — Telephone Encounter (Signed)
Medication refill for one time only.  Patient needs to be seen.  Letter sent for patient to call and schedule 

## 2013-05-05 ENCOUNTER — Other Ambulatory Visit: Payer: Self-pay | Admitting: Family Medicine

## 2013-05-07 ENCOUNTER — Encounter: Payer: Self-pay | Admitting: Family Medicine

## 2013-05-29 IMAGING — CR DG ABDOMEN ACUTE W/ 1V CHEST
3 series · 3 of 3 positions shown · non-contrast
Comparison: No priors.

CLINICAL DATA: Mid abdominal pain and distension.  Diarrhea.

ACUTE ABDOMEN SERIES (ABDOMEN 2 VIEW & CHEST 1 VIEW)

[w chest pa]
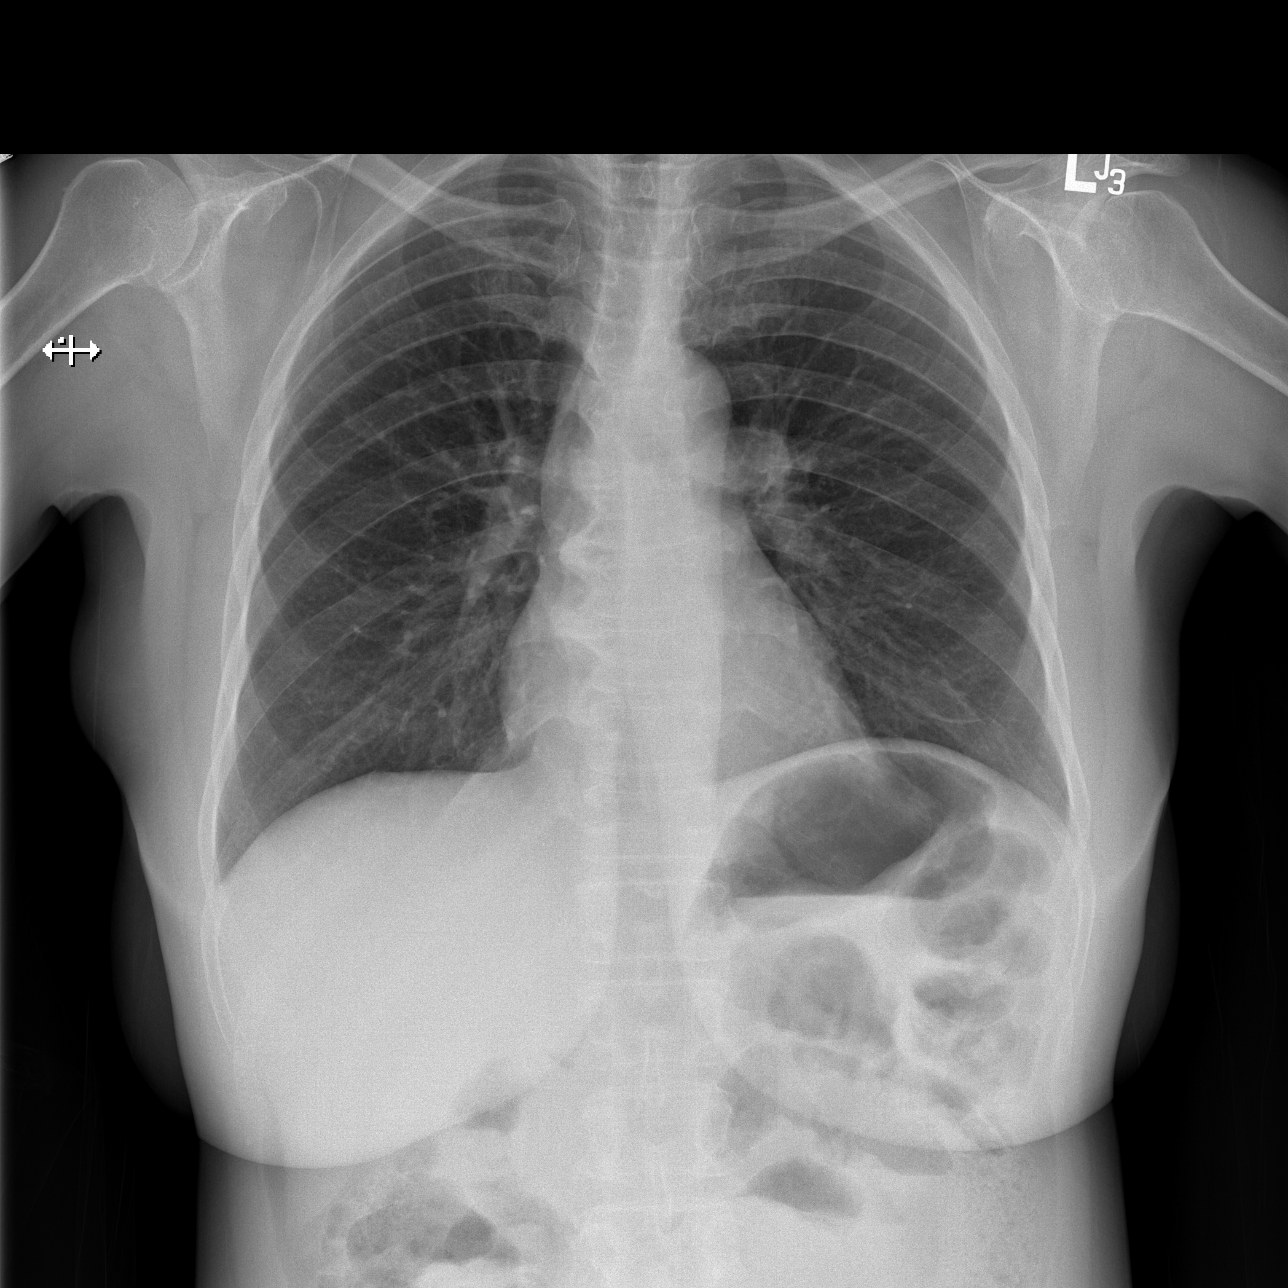

[w abdomen upright]
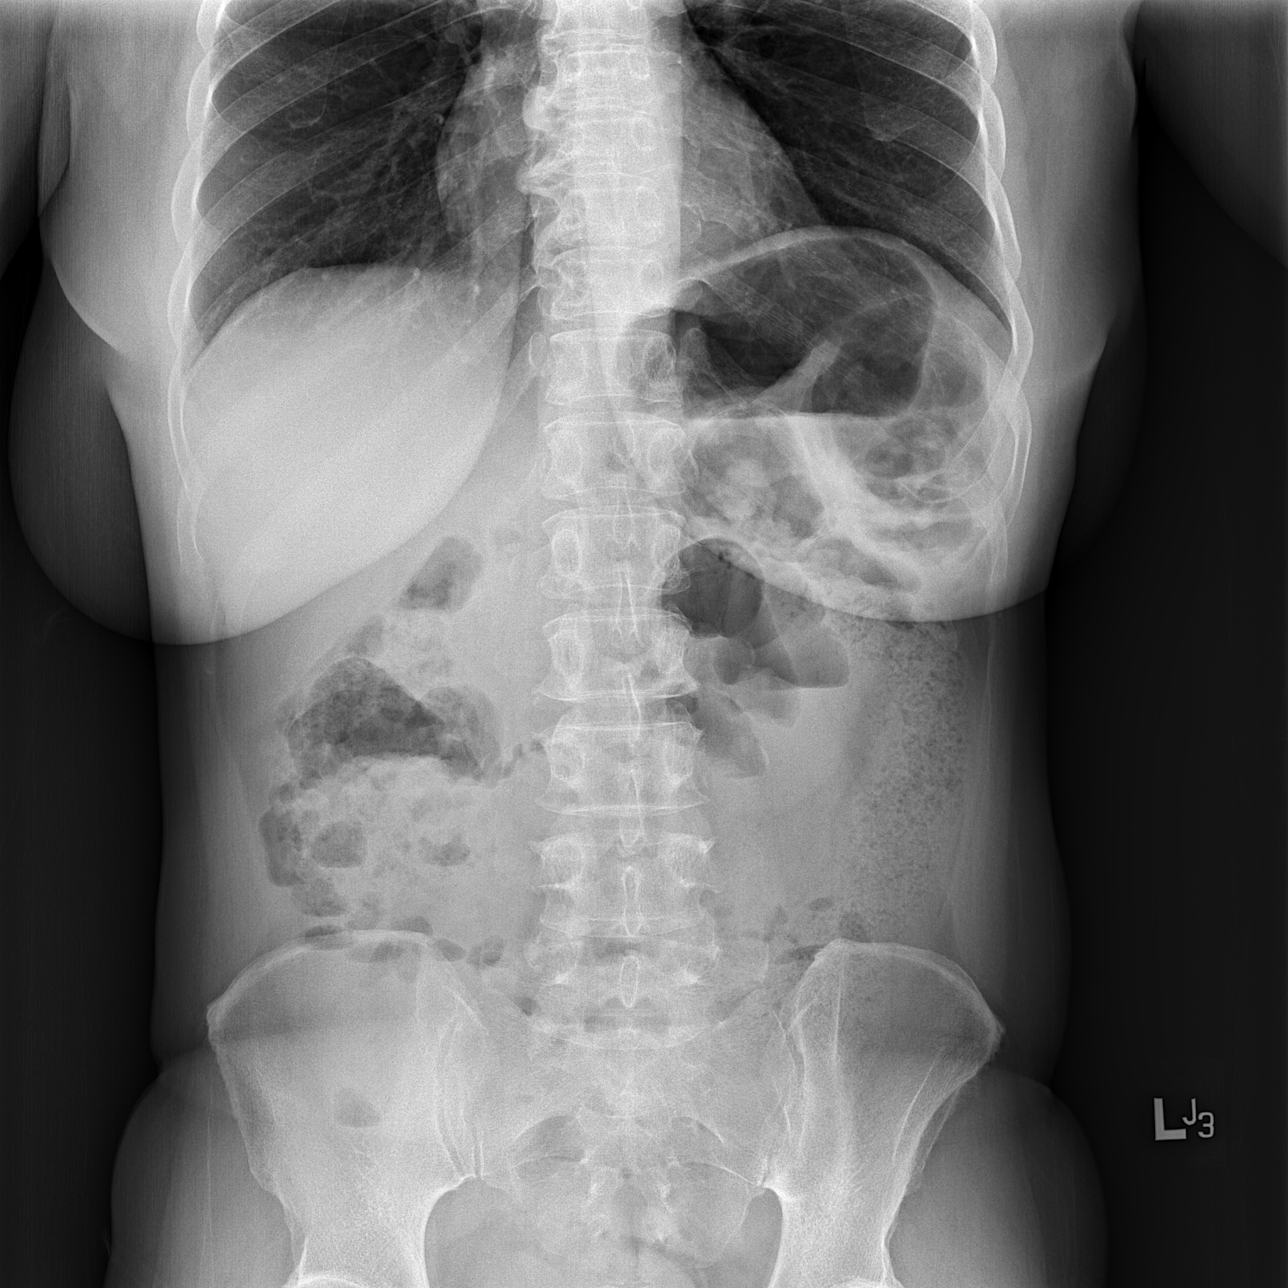

[t abdomen supine]
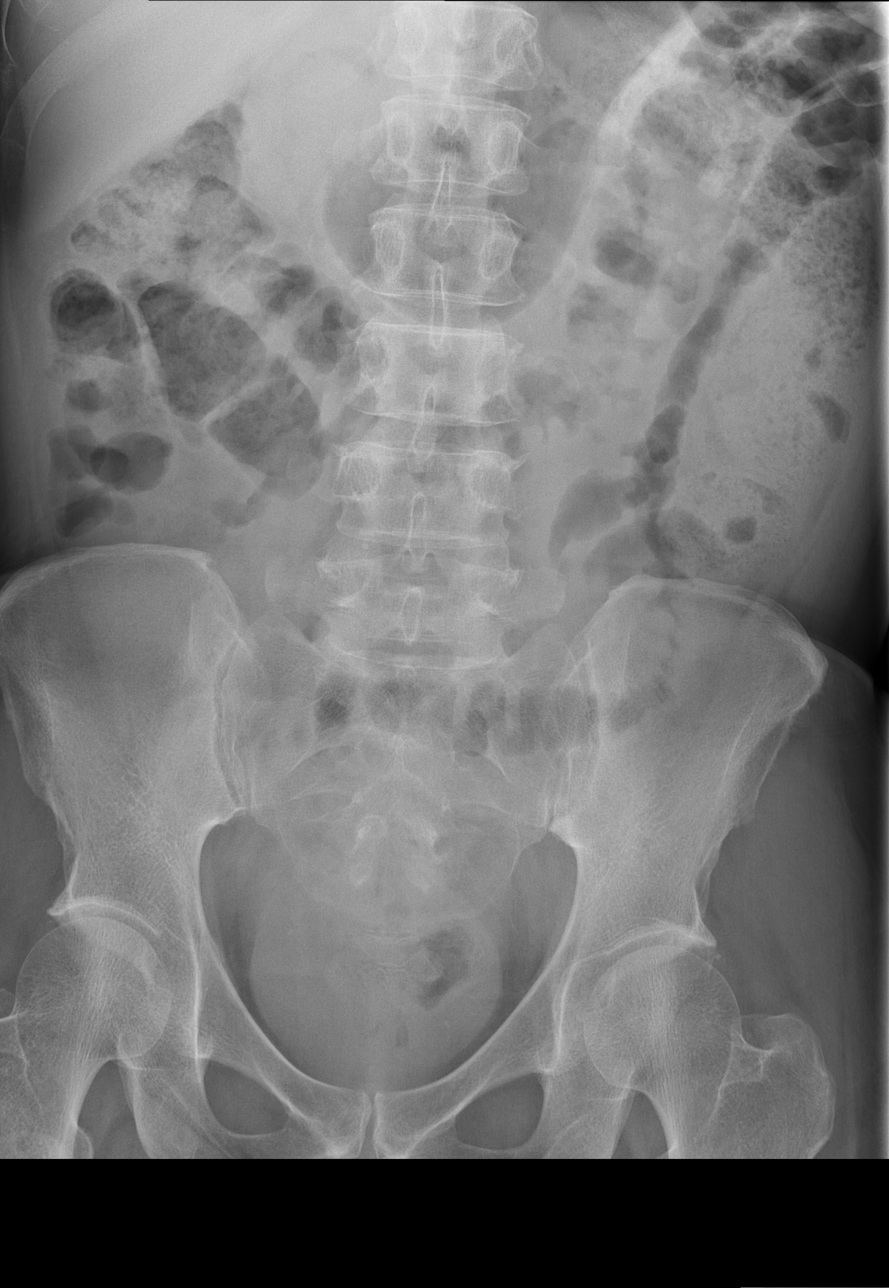

[3 of 3 positions shown; findings below may reference images not displayed]

FINDINGS: Lung volumes are normal.  No consolidative airspace
disease.  No pleural effusions.  No pneumothorax.  No pulmonary
nodule or mass noted.  Pulmonary vasculature and the
cardiomediastinal silhouette are within normal limits.  No
pneumoperitoneum.

Supine and upright views of the abdomen demonstrate gas of stool
scattered throughout the colon extending to the level of the distal
rectum.  There is several nondilated loops of gas-filled small
bowel noted.  No pathologic distension of small bowel is
definitively identified.  There are a few small air fluid levels
noted on the upright view.
IMPRESSION: 1.  Nonspecific, nonobstructive bowel gas pattern.
2.  No pneumoperitoneum.
3.  No radiographic evidence of acute cardiopulmonary disease.

## 2013-06-01 IMAGING — US US ABDOMEN COMPLETE
1 series · 14 of 25 positions shown · non-contrast
Comparison: None.

CLINICAL DATA: Mid abdominal pain

COMPLETE ABDOMINAL ULTRASOUND

[Series 1: us abdomen complete · 0.32mm/px · 14 of 87 slices shown]
[im 1/87]
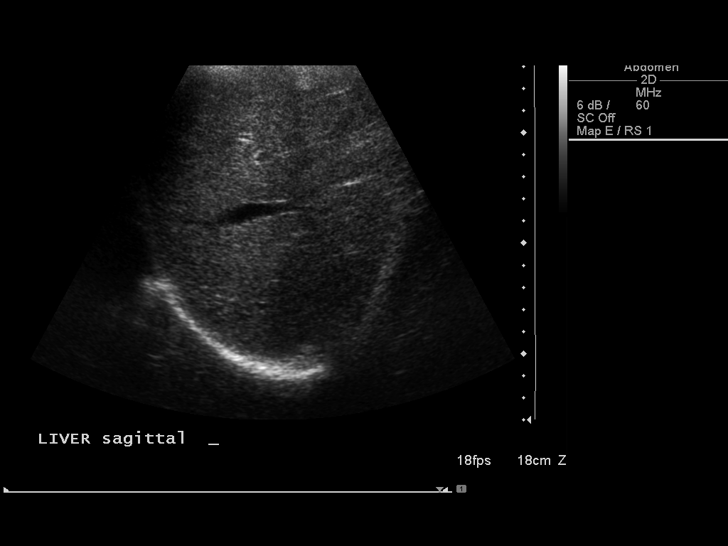
[im 8/87]
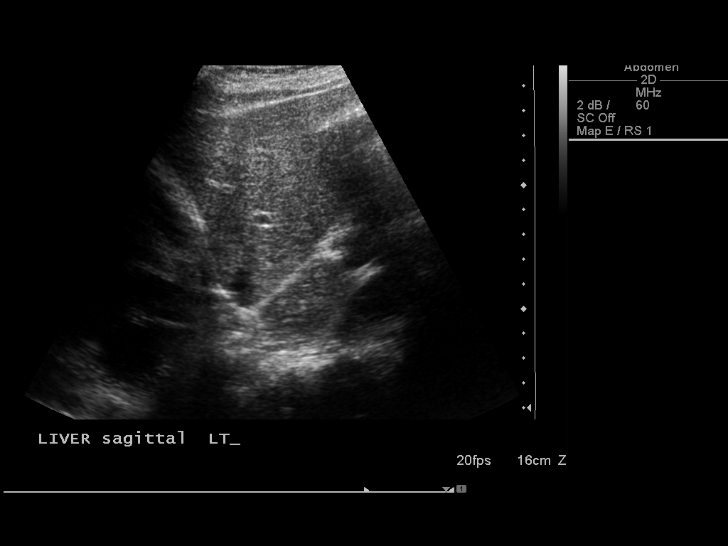
[im 15/87]
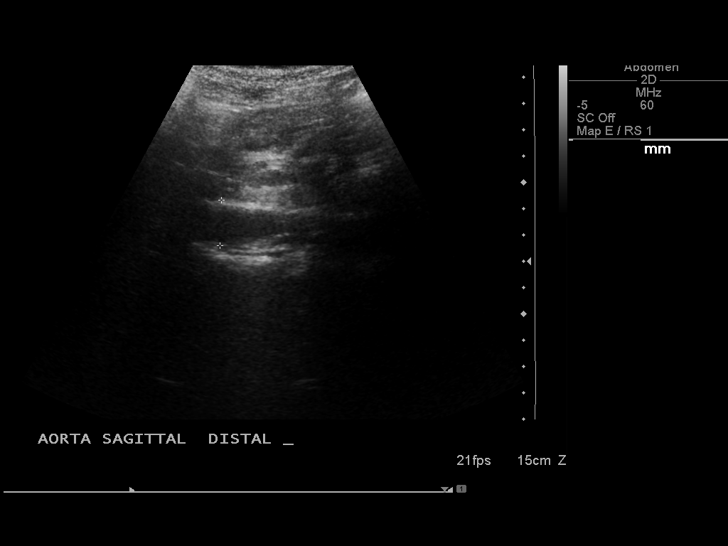
[im 22/87]
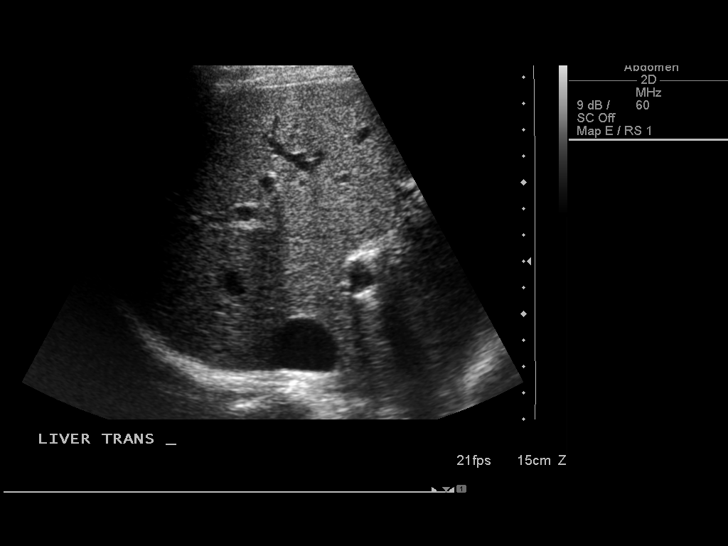
[im 29/87]
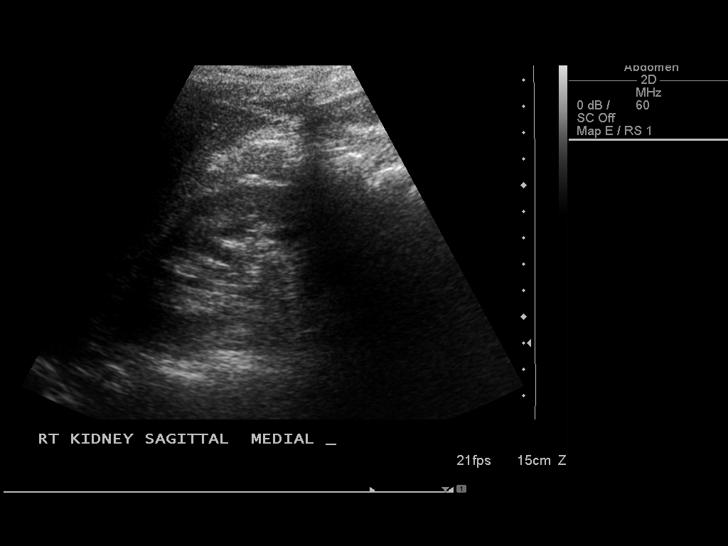
[im 33/87]
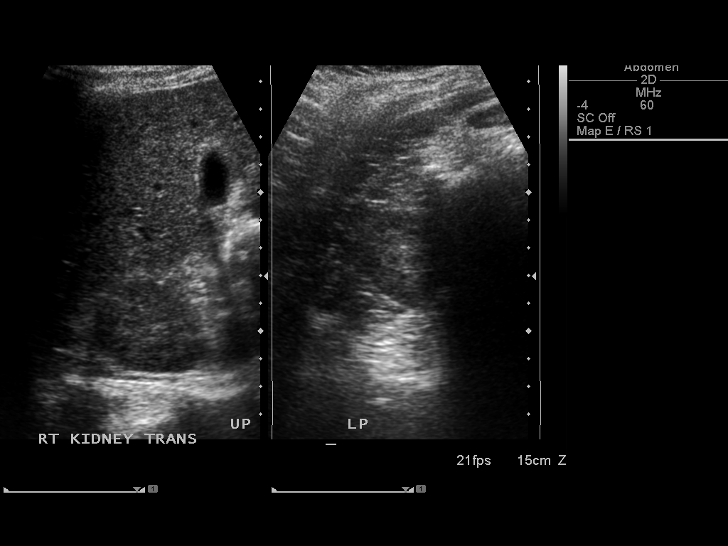
[im 40/87]
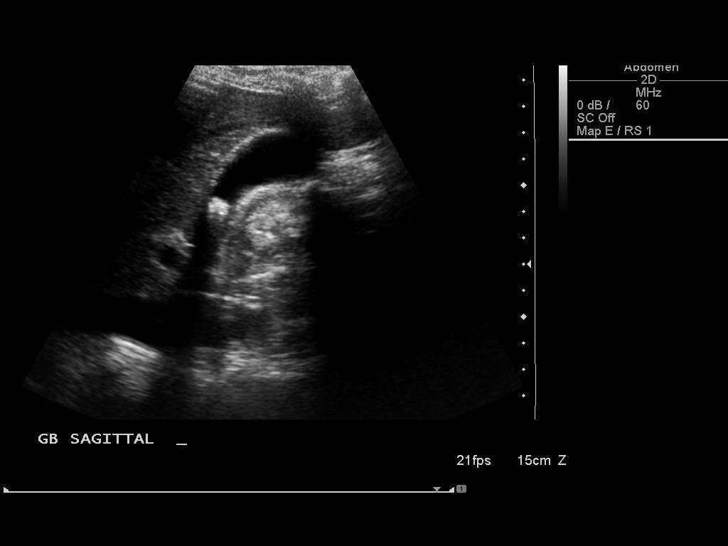
[im 47/87]
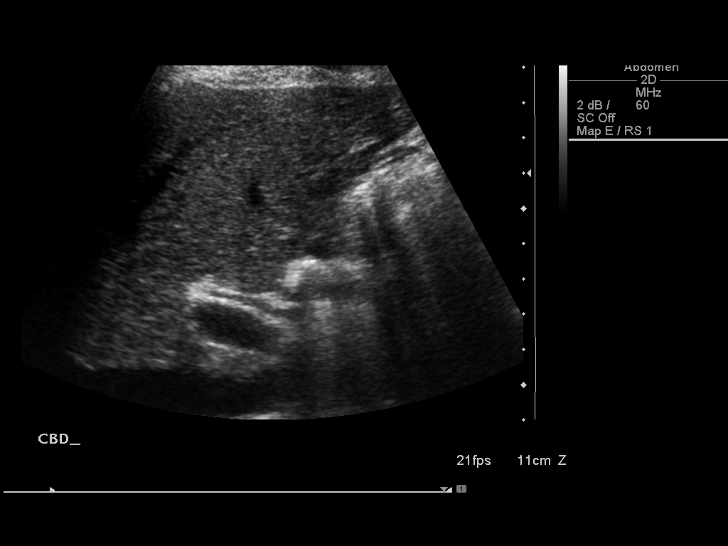
[im 54/87]
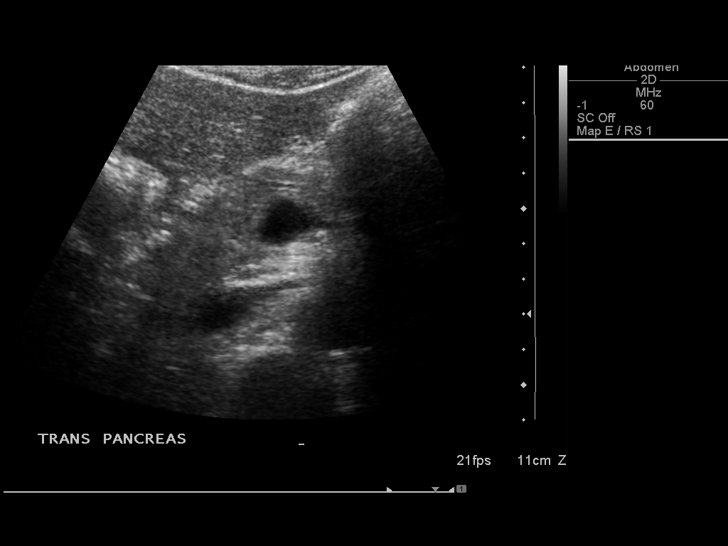
[im 58/87]
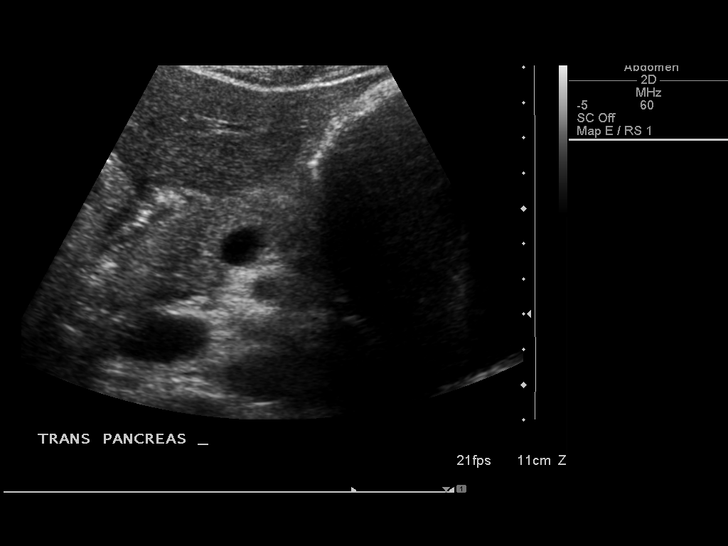
[im 65/87]
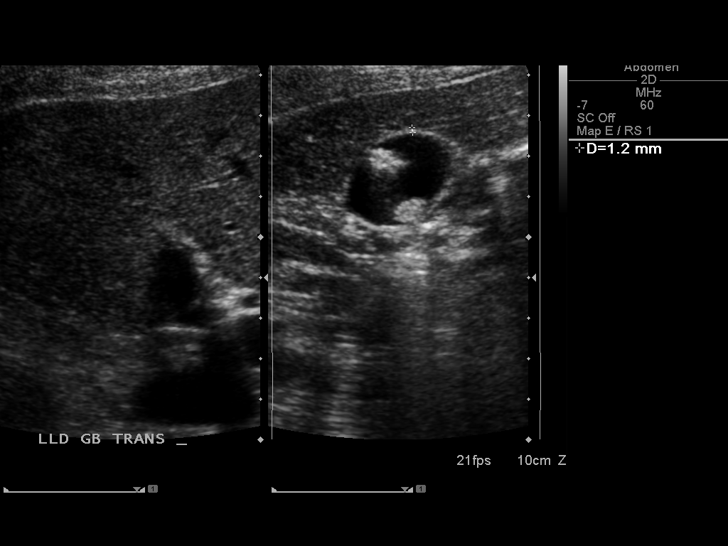
[im 72/87]
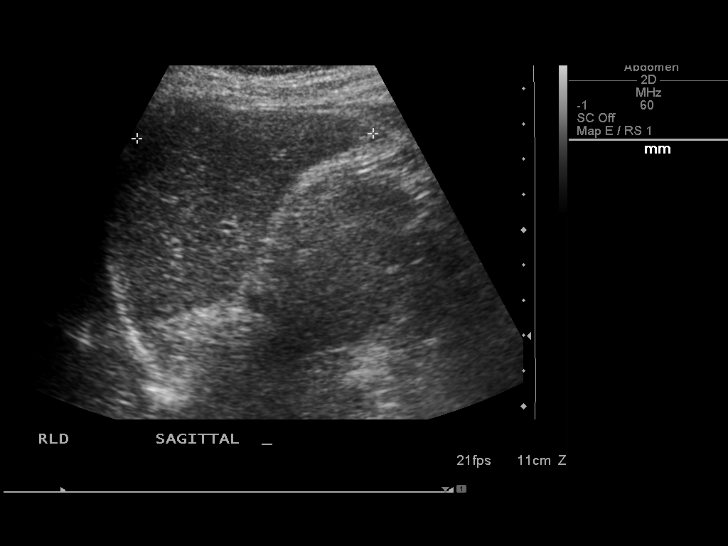
[im 79/87]
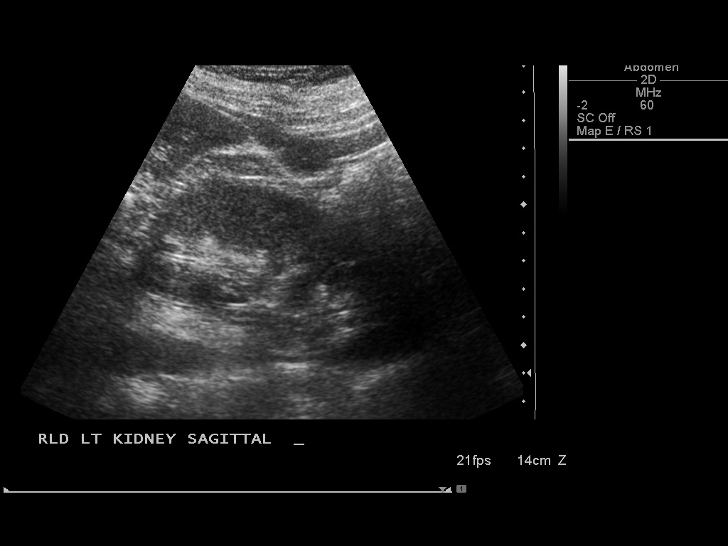
[im 87/87]
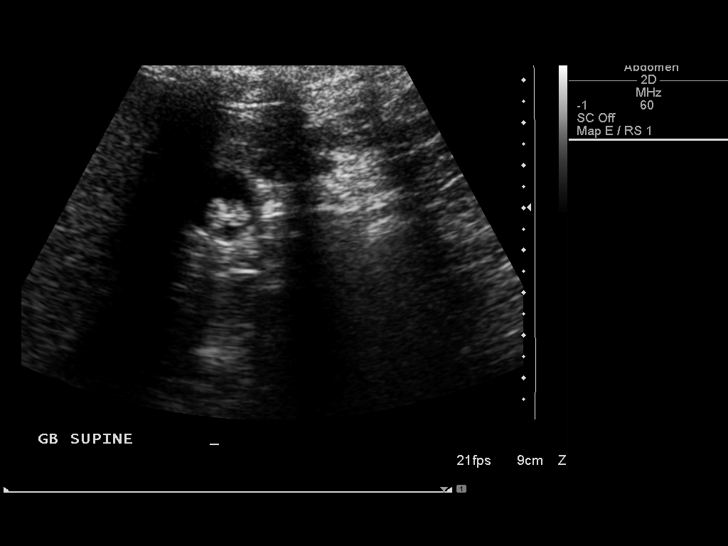

[14 of 25 positions shown; findings below may reference images not displayed]

FINDINGS: Gallbladder:  Cholelithiasis.  No gallbladder wall thickening or
pericholecystic fluid.  Negative sonographic Murphy's sign.

Common bile duct:  Measures 4 mm.

Liver:  No focal lesion identified.  Within normal limits in
parenchymal echogenicity.

IVC:  Appears normal.

Pancreas:  Incompletely visualized but grossly unremarkable.

Spleen:  Measures 6.7 cm.

Right Kidney:  Measures 11.4 cm.  7 mm upper pole cyst.  No
hydronephrosis.

Left Kidney:  Mass or hydronephrosis.

Abdominal aorta:  No aneurysm identified.
IMPRESSION: Cholelithiasis, without associated findings to suggest acute
cholecystitis.

## 2013-06-02 ENCOUNTER — Other Ambulatory Visit: Payer: Self-pay | Admitting: Family Medicine

## 2013-06-19 ENCOUNTER — Other Ambulatory Visit: Payer: Self-pay | Admitting: *Deleted

## 2013-06-19 ENCOUNTER — Ambulatory Visit (INDEPENDENT_AMBULATORY_CARE_PROVIDER_SITE_OTHER): Payer: BC Managed Care – PPO | Admitting: Endocrinology

## 2013-06-19 ENCOUNTER — Encounter: Payer: Self-pay | Admitting: Endocrinology

## 2013-06-19 ENCOUNTER — Encounter: Payer: BC Managed Care – PPO | Attending: Endocrinology | Admitting: Nutrition

## 2013-06-19 VITALS — BP 128/82 | HR 93 | Temp 98.1°F | Resp 12 | Ht 66.0 in | Wt 143.0 lb

## 2013-06-19 DIAGNOSIS — E1165 Type 2 diabetes mellitus with hyperglycemia: Secondary | ICD-10-CM

## 2013-06-19 DIAGNOSIS — E049 Nontoxic goiter, unspecified: Secondary | ICD-10-CM

## 2013-06-19 DIAGNOSIS — E119 Type 2 diabetes mellitus without complications: Secondary | ICD-10-CM

## 2013-06-19 DIAGNOSIS — IMO0002 Reserved for concepts with insufficient information to code with codable children: Secondary | ICD-10-CM | POA: Insufficient documentation

## 2013-06-19 DIAGNOSIS — IMO0001 Reserved for inherently not codable concepts without codable children: Secondary | ICD-10-CM

## 2013-06-19 DIAGNOSIS — Z713 Dietary counseling and surveillance: Secondary | ICD-10-CM | POA: Insufficient documentation

## 2013-06-19 DIAGNOSIS — E010 Iodine-deficiency related diffuse (endemic) goiter: Secondary | ICD-10-CM

## 2013-06-19 DIAGNOSIS — I10 Essential (primary) hypertension: Secondary | ICD-10-CM

## 2013-06-19 LAB — COMPREHENSIVE METABOLIC PANEL
ALT: 22 U/L (ref 0–35)
AST: 23 U/L (ref 0–37)
Albumin: 4.6 g/dL (ref 3.5–5.2)
Alkaline Phosphatase: 75 U/L (ref 39–117)
BUN: 15 mg/dL (ref 6–23)
CALCIUM: 9.6 mg/dL (ref 8.4–10.5)
CHLORIDE: 97 meq/L (ref 96–112)
CO2: 26 mEq/L (ref 19–32)
CREATININE: 0.9 mg/dL (ref 0.4–1.2)
GFR: 84.1 mL/min (ref >60.00)
Glucose, Bld: 366 mg/dL — ABNORMAL HIGH (ref 70–99)
Potassium: 4.3 mEq/L (ref 3.5–5.1)
Sodium: 133 mEq/L — ABNORMAL LOW (ref 135–145)
Total Bilirubin: 0.9 mg/dL (ref 0.3–1.2)
Total Protein: 8.6 g/dL — ABNORMAL HIGH (ref 6.0–8.3)

## 2013-06-19 LAB — URINALYSIS, ROUTINE W REFLEX MICROSCOPIC
Bilirubin Urine: NEGATIVE
HGB URINE DIPSTICK: NEGATIVE
Ketones, ur: 15 — AB
LEUKOCYTES UA: NEGATIVE
NITRITE: NEGATIVE
RBC / HPF: NONE SEEN (ref 0–?)
Specific Gravity, Urine: 1.02 (ref 1.000–1.030)
TOTAL PROTEIN, URINE-UPE24: NEGATIVE
UROBILINOGEN UA: 0.2 (ref 0.0–1.0)
Urine Glucose: >=1000 — AB
WBC UA: NONE SEEN (ref 0–?)
pH: 5.5 (ref 5.0–8.0)

## 2013-06-19 LAB — GLUCOSE, POCT (MANUAL RESULT ENTRY): POC Glucose: 431 mg/dl — AB (ref 70–99)

## 2013-06-19 LAB — HEMOGLOBIN A1C: Hgb A1c MFr Bld: 13.1 % — ABNORMAL HIGH (ref 4.6–6.5)

## 2013-06-19 MED ORDER — GLUCOSE BLOOD VI STRP: ORAL_STRIP | Status: DC

## 2013-06-19 MED ORDER — FREESTYLE LANCETS MISC: Status: DC

## 2013-06-19 NOTE — Progress Notes (Signed)
Patient ID: Sherry Hart, female   DOB: Oct 17, 1960, 53 y.o.   MRN: [id number]   Reason for Appointment : Consultation for Type 2 Diabetes  History of Present Illness          Diagnosis: Type 2 diabetes mellitus, date of diagnosis: 2002       Past history: She has been treated with metformin since diagnosis and previous records are not available for review. She thinks that when she was taking her blood sugar regularly there were usually below 150 in the morning In 2012 because of lack of insurance she did not take her medications or check her sugar for about a year In 6/13 she was evaluated in the emergency room for abdominal pain and was found to have an A1c of 14% with very high blood sugars. Her only symptoms were weight loss. She was given Lantus, unknown dose for about 2 months but she went off this on her own since she did not want to continue this while going to work Also was presumably given glipizide at that time but no details available  Recent history: She has not been seen in followup by her PCP for about a year and a half Although she feels fairly good and has not checked her blood sugar she is here today for followup of her diabetes and wanted to be seen by a specialist She does not think she has excessive thirst or urination although she is starting to get up about twice at night and also occasionally may have some dry mouth. No blurred vision. She has not checked her weight but she thinks her clothes are looser now       Oral hypoglycemic drugs the patient is taking are: Metformin 1 g daily      Side effects from medications have been:  none  Glucose monitoring:  done one time a day         Glucometer: Freestyle    Blood Glucose readings none, has not checked in several months  Hypoglycemia: Never      Glycemic control:   Lab Results  Component Value Date   HGBA1C 14.1* 11/21/2011   Lab Results  Component Value Date   CREATININE 0.70 11/21/2011    Self-care: The  diet that the patient has been following is: None, usually low fat. Eating cereal or oatmeal for breakfast,: Usually has fruit for snacks     Meals: 3 meals per day. Avoiding drinks with sugar           Exercise: walks in summer only          Dietician visit: Most recent:.2002 attended a class               Compliance with the medical regimen: Poor Retinal exam: Most recent: 12/13  Weight history: 125 upto 173, her lowest weight was in 11/2011  [facility] Weights   06/19/13 1123  Weight: 143 lb (64.864 kg)      Medication List       This list is accurate as of: 06/19/13  2:51 PM.  Always use your most recent med list.               losartan 25 MG tablet  Commonly known as:  COZAAR  TAKE 1 TABLET EVERY DAY     magnesium hydroxide 400 MG/5ML suspension  Commonly known as:  MILK OF MAGNESIA  Take 30 mLs by mouth daily as needed.     metFORMIN 1000 MG tablet  Commonly known as:  GLUCOPHAGE  TAKE 1 TABLET BY MOUTH TWICE A DAY     OXYCODONE HCL PO  Take 5 mg by mouth as needed.        Allergies:  Allergies  Allergen Reactions  . Penicillins Hives    Back of neck and upper back.    Past Medical History  Diagnosis Date  . Hypertension   . Diabetes mellitus 12 yrs ago   . Biliary colic   . Anemia   . Constipation   . Unexplained weight loss   . Abdominal pain   . Vomiting   . Weakness     Past Surgical History  Procedure Laterality Date  . Wisdom tooth extraction    . Cesarean section  11/29/1990, 04/06/1994  . Knee surgery  11/2000    right  . Mouth surgery  04/04/1987  . Colonoscopy  09/01/2004    Family History  Problem Relation Age of Onset  . Cancer Mother     breast  . Diabetes Father   . Thyroid disease Sister     Social History:  reports that she has never smoked. She has never used smokeless tobacco. She reports that she does not drink alcohol or use illicit drugs.    Review of Systems       Lipids: Unknown       No unusual headaches.                   Skin: No rash or infections     Thyroid:  No  unusual fatigue.     The blood pressure has been mildly high. On Cozaar 25 mg for uncertain duration of time, no monitoring at home     No swelling of feet.     No shortness of breath on exertion.     Bowel habits: Normal.       No frequency of urination or nocturia      Has had pain in her thumbs with some enlargement. No low back pain.      No  depression          No history of Numbness, tingling or burning in her feet. Does feel coldness in her feet. Occasionally out of her feet may hurt at night.   Physical Examination:  BP 128/82  Pulse 93  Temp(Src) 98.1 F (36.7 C)  Resp 12  Ht 5\' 6"  (1.676 m)  Wt 143 lb (64.864 kg)  BMI 23.09 kg/m2  SpO2 98%  GENERAL:  averagely built and nourished, well groomed and pleasant  HEENT:         Eye exam shows normal external appearance. Fundus exam shows no retinopathy. Oral exam shows normal mucosa .  NECK:         General:  Neck exam shows no lymphadenopathy. Carotids are normal to palpation and no bruit heard. Thyroid is about 1-1/2 times enlarged on the right and no nodules felt.   LUNGS:         Chest is symmetrical. Lungs are clear to auscultation.Marland Kitchen   HEART:         Heart sounds:  S1 and S2 are normal. No murmurs or clicks heard., no S3 or S4.   ABDOMEN:         General:  There is no distention present. Liver and spleen are not palpable. No other mass or tenderness present.  EXTREMITIES:     There is no edema. No skin lesions present.Marland Kitchen  NEUROLOGICAL:  Vibration sense is moderaytely reduced in toes. Ankle jerks are 1+ on the right and 2+ on the left .          Diabetic foot exam:  as in the foot exam section MUSCULOSKELETAL:       There is no enlargement or deformity of the joints. Spine is normal to inspection.Marland Kitchen   PEDAL pulses: normal  SKIN:       No rash or lesions of concern.        ASSESSMENT:  Diabetes type 2, uncontrolled     The patient has been quite  noncompliant with her self care and followup Appears to have marked hyperglycemia with glucose over 400 today; surprisingly is not very symptomatic with this Currently taking only 1000 mg metformin daily  Since she is not obese she may well be insulin deficient  Complications: No significant complications evident on exam today, will need followup eye exam  ? Hypertension: She is taking only 25 mg of losartan and may have been given this for nephropathy prevention  Unknown lipid status   PLAN:   1. Restart home glucose monitoring. Discussed frequency and timing of glucose monitoring and blood sugar targets. She will bring her monitor for download on each visit 2. Start NovoLog mix 70/30 insulin 10 units before breakfast and supper empirically. She will increase the dose every 3 days to get to a target of at least <150 before breakfast and supper. She was instructed in detail by the nurse educator and sample given 3. Consultation for meal planning with dietitian 4. Continue metformin, increase the dose to twice a day 5. Not clear if she will need to continue insulin long-term as yet. Will discuss with this on future visits 6. Baseline serum chemistries, rule out ketonuria with urinalysis today, baseline A1c 7. Check lipids when blood sugars are better controlled 8. Continue low-dose losartan for now since blood pressure is normal    Eilidh Marcano 06/19/2013, 2:51 PM

## 2013-06-19 NOTE — Patient Instructions (Signed)
Please check blood sugars at least half the time about 2 hours after any meal and daily on waking up. Please bring blood sugar monitor to each visit  Start NovoLog mix 70/30, 10 units right before breakfast and supper  If the blood sugar before breakfast is still over 150 after 3 days go up 2 units on the evening insulin and continue to increase the dose every 3 days  If the blood sugar before supper is still over 150 after 3 days go up 2 units on the morning insulin and continue to increase the dose every 3 days  When the blood sugar is below 100 at any time may reduce the insulin doses by 2 units  Walk regularly, 20-30 minutes  Have protein like egg or low fat meat or cheese with breakfast daily

## 2013-06-20 NOTE — Patient Instructions (Signed)
1.  Take 10u on insulin 10 min. Before breakfast and supper. 2.  Test blood sugars before meals and at bedtime. 3.  Read over literature given from starter kits. 4.  Call if questions.

## 2013-06-20 NOTE — Progress Notes (Signed)
Pt. Was instructructed on the use of the Novolog 70/30 FlexPen.  She was instructed to take 10u 10 min. Before breakfast and supper.  She redemonstrated how to put on a needle and dial the dose.  She did not give her first injection today, due to timing.  She will begin tonight acS.   She was given a sample of 70/30 FlexPen with a BD pen starter kit and Novolog Pen starter kit. We discussed the need to eat lunch q day 4-5 hours after the morning injection and she reported good understanding of this.   We also discussed low blood sugars:  Symptoms and treatments.  She was given a sample of glucose tablets for this.  She had no final questions.   Written instructions were given for 10u before breakfast and supper.  Appropriate injection sites, and the need for site rotations, were also discussed.

## 2013-06-21 ENCOUNTER — Telehealth: Payer: Self-pay | Admitting: Endocrinology

## 2013-06-21 NOTE — Telephone Encounter (Signed)
Pt. Reported that she had no difficulty giving self insulin Monday night, or Tuesday morning.  She reported taking 10u before supper and breakfast yesterday.  She had not tested her blood sugars, because she had not gone by the drug store to pick up the new prescription. I encouraged her to do this today on her way home from work, and she said she would do this.  She will call if blood sugars are still very high or lower than 80.

## 2013-06-22 ENCOUNTER — Other Ambulatory Visit: Payer: Self-pay

## 2013-06-22 DIAGNOSIS — Z803 Family history of malignant neoplasm of breast: Secondary | ICD-10-CM

## 2013-06-22 DIAGNOSIS — Z1231 Encounter for screening mammogram for malignant neoplasm of breast: Secondary | ICD-10-CM

## 2013-06-23 ENCOUNTER — Other Ambulatory Visit: Payer: Self-pay | Admitting: *Deleted

## 2013-06-23 MED ORDER — METFORMIN HCL 1000 MG PO TABS: 1000.0000 mg | ORAL_TABLET | Freq: Two times a day (BID) | ORAL | Status: DC

## 2013-06-23 MED ORDER — INSULIN ASPART PROT & ASPART (70-30 MIX) 100 UNIT/ML PEN: 10.0000 [IU] | PEN_INJECTOR | Freq: Two times a day (BID) | SUBCUTANEOUS | Status: DC

## 2013-06-25 ENCOUNTER — Other Ambulatory Visit: Payer: Self-pay | Admitting: Family Medicine

## 2013-06-25 ENCOUNTER — Other Ambulatory Visit: Payer: Self-pay | Admitting: Endocrinology

## 2013-07-03 ENCOUNTER — Encounter: Payer: Self-pay | Admitting: Physician Assistant

## 2013-07-03 ENCOUNTER — Ambulatory Visit (INDEPENDENT_AMBULATORY_CARE_PROVIDER_SITE_OTHER): Payer: BC Managed Care – PPO | Admitting: Physician Assistant

## 2013-07-03 ENCOUNTER — Ambulatory Visit
Admission: RE | Admit: 2013-07-03 | Discharge: 2013-07-03 | Disposition: A | Discharge status: Home / Self Care | Payer: BC Managed Care – PPO | Source: Ambulatory Visit

## 2013-07-03 VITALS — BP 164/90 | HR 88 | Temp 98.8°F | Resp 18 | Wt 147.0 lb

## 2013-07-03 DIAGNOSIS — Z124 Encounter for screening for malignant neoplasm of cervix: Secondary | ICD-10-CM

## 2013-07-03 DIAGNOSIS — Z1231 Encounter for screening mammogram for malignant neoplasm of breast: Secondary | ICD-10-CM

## 2013-07-03 DIAGNOSIS — Z01419 Encounter for gynecological examination (general) (routine) without abnormal findings: Secondary | ICD-10-CM

## 2013-07-03 DIAGNOSIS — Z1239 Encounter for other screening for malignant neoplasm of breast: Secondary | ICD-10-CM

## 2013-07-03 DIAGNOSIS — Z803 Family history of malignant neoplasm of breast: Secondary | ICD-10-CM

## 2013-07-03 NOTE — Progress Notes (Signed)
Patient ID: Sherry Hart MRN: [id number], DOB: Aug 15, 1960, 53 y.o. Date of Encounter: @DATE @  Chief Complaint:  Chief Complaint  Patient presents with  . here for PAP smear    says it has been long time, has Mammo appt today    HPI: 53 y.o. year old [name] female  presents for breast exam, pelvic exam and Pap smear.  She sees Dr. Dwyane Dee for her diabetes. She reports that Dr. Dennard Schaumann is her PCP but that she wanted to see me as a female to do her GYN exam. Today she just wants to get her breast exam and pelvic exam and Pap smear done. She has no complaints.  She's had 2 children. No other GYN history. She states that she is postmenopausal. Last menstrual period was approximately age 53 age 71. States that she is having  no hot flashes. Mother passed away at age 54 with breast cancer. No other family history of GYN cancers. Patient is scheduled for mammogram later today.  She states that her last Pap smear was over 5 years ago. Says that all Pap smears were normal in the past.    Past Medical History  Diagnosis Date  . Hypertension   . Diabetes mellitus 12 yrs ago   . Biliary colic   . Anemia   . Constipation   . Unexplained weight loss   . Abdominal pain   . Vomiting   . Weakness      Home Meds: See attached medication section for current medication list. Any medications entered into computer today will not appear on this note's list. The medications listed below were entered prior to today. Current Outpatient Prescriptions on File Prior to Visit  Medication Sig Dispense Refill  . B-D ULTRAFINE III SHORT PEN 31G X 8 MM MISC USE AS DIRECTED  100 each  2  . BD PEN NEEDLE NANO U/F 32G X 4 MM MISC USE AS DIRECTED  50 each  5  . glucose blood (FREESTYLE LITE) test strip Use as instructed to check blood sugars 3 times per day dx code 250.02  100 each  5  . Insulin Aspart Prot & Aspart (NOVOLOG MIX 70/30 FLEXPEN) (70-30) 100 UNIT/ML Pen Inject 10 Units into the skin 2 (two)  times daily.  5 pen  1  . Lancets (FREESTYLE) lancets Use as instructed to check blood sugars 3 times per day  100 each  5  . losartan (COZAAR) 25 MG tablet TAKE 1 TABLET EVERY DAY  30 tablet  1  . magnesium hydroxide (MILK OF MAGNESIA) 400 MG/5ML suspension Take 30 mLs by mouth daily as needed.      . metFORMIN (GLUCOPHAGE) 1000 MG tablet Take 1 tablet (1,000 mg total) by mouth 2 (two) times daily with a meal.  60 tablet  1   No current facility-administered medications on file prior to visit.    Allergies:  Allergies  Allergen Reactions  . Penicillins Hives    Back of neck and upper back.    History   Social History  . Marital Status: Married    Spouse Name: N/A    Number of Children: N/A  . Years of Education: N/A   Occupational History  . Not on file.   Social History Main Topics  . Smoking status: Never Smoker   . Smokeless tobacco: Never Used  . Alcohol Use: No  . Drug Use: No  . Sexual Activity: Not on file   Other Topics Concern  .  Not on file   Social History Narrative  . No narrative on file    Family History  Problem Relation Age of Onset  . Cancer Mother     breast  . Diabetes Father   . Thyroid disease Sister      Review of Systems:  See HPI for pertinent ROS. All other ROS negative.    Physical Exam: Blood pressure 154/90, pulse 88, temperature 98.8 F (37.1 C), temperature source Oral, resp. rate 18, weight 147 lb (66.679 kg)., Body mass index is 23.74 kg/(m^2). General:WNWD AAF. Appears in no acute distress. Neck: Supple. No thyromegaly. No lymphadenopathy. Lungs: Clear bilaterally to auscultation without wheezes, rales, or rhonchi. Breathing is unlabored. Heart: RRR with S1 S2. No murmurs, rubs, or gallops. Breast exam: Breasts are symmetrical. No masses. No nipple discharge. Pelvic exam: External genitalia are normal. Vaginal mucosa normal. Cervix normal. Bimanual exam is normal. Uterus is normal size. No adnexal mass. Musculoskeletal:   Strength and tone normal for age. Extremities/Skin: Warm and dry.  Neuro: Alert and oriented X 3. Moves all extremities spontaneously. Gait is normal. CNII-XII grossly in tact. Psych:  Responds to questions appropriately with a normal affect.     ASSESSMENT AND PLAN:  53 y.o. year old female with  1. Breast cancer screening Breast exam today is normal. Discussed self breast exams. She is scheduled for mammogram later today.  2. Screening breast examination Normal.  3. Encounter for cervical Pap smear with pelvic exam - PAP, Thin Prep w/HPV rflx HPV Type 16/18  Followup in 1 year for breast exam pelvic exam.   Signed, Karis Juba, [place], [facility] 07/03/2013 12:12 PM

## 2013-07-05 ENCOUNTER — Encounter: Payer: Self-pay | Admitting: Endocrinology

## 2013-07-05 ENCOUNTER — Ambulatory Visit (INDEPENDENT_AMBULATORY_CARE_PROVIDER_SITE_OTHER): Payer: BC Managed Care – PPO | Admitting: Endocrinology

## 2013-07-05 VITALS — BP 124/82 | HR 87 | Temp 98.3°F | Resp 12 | Ht 66.0 in | Wt 150.7 lb

## 2013-07-05 DIAGNOSIS — IMO0001 Reserved for inherently not codable concepts without codable children: Secondary | ICD-10-CM

## 2013-07-05 DIAGNOSIS — E1165 Type 2 diabetes mellitus with hyperglycemia: Principal | ICD-10-CM

## 2013-07-05 LAB — PAP, THIN PREP W/HPV RFLX HPV TYPE 16/18: HPV DNA High Risk: DETECTED — AB

## 2013-07-05 LAB — HPV TYPE 16/18
HPV GENOTYPE, 16: NOT DETECTED
HPV GENOTYPE, 18: NOT DETECTED

## 2013-07-05 NOTE — Progress Notes (Signed)
Patient ID: Sherry Hart, female   DOB: 04-Sep-1960, 53 y.o.   MRN: [id number]   Reason for Appointment : Followup of Type 2 Diabetes  History of Present Illness          Diagnosis: Type 2 diabetes mellitus, date of diagnosis: 2002       Past history: She has been treated with metformin since diagnosis and previous records are not available for review. She thinks that when she was taking her blood sugar regularly there were usually below 150 in the morning In 2012 because of lack of insurance she did not take her medications or check her sugar for about a year In 6/13 she was evaluated in the emergency room for abdominal pain and was found to have an A1c of 14% with very high blood sugars. Her only symptoms were weight loss. She was given Lantus, unknown dose for about 2 months but she went off this on her own since she did not want to continue this while going to work. Also was presumably given glipizide at that time but no details available  Recent history:  She was seen for initial consultation in 1/15. She had a glucose in the office of 431 but was asymptomatic except for possible weight loss. Baseline A1c was 13.1 Because of her marked hyperglycemia with taking only 1000 mg metformin a day she was given premixed insulin, 10 units of NovoLog mix 70/30 twice a day. With this her blood sugars have improved progressively. She also feels less tired but does complain of some persistent blurred vision. Her weight has come up 7 pounds  Blood sugars are excellent this week although did have a couple of high readings on the weekend  However is checking her blood sugars mostly before meals especially breakfast and supper       Oral hypoglycemic drugs the patient is taking are: Metformin 1 g twice a day Insulin regimen: NovoLog mix 70/3010 units twice a day      Side effects from medications have been:  none  Glucose monitoring:  done 2 times a day         Glucometer: Freestyle    Blood Glucose  readings   PREMEAL Breakfast Lunch Dinner Bedtime Overall  Glucose range: 127-222  86, 95  70-282   70-289   Mean/median:  158    160    147    Hypoglycemia: None      Glycemic control:   Lab Results  Component Value Date   HGBA1C 13.1* 06/19/2013   HGBA1C 14.1* 11/21/2011   Lab Results  Component Value Date   CREATININE 0.9 06/19/2013    Self-care: The diet that the patient has been following is: None, usually low fat. Eating cereal or oatmeal for breakfast,: Usually has fruit for snacks     Meals: 3 meals per day. Avoiding drinks with sugar           Exercise: walks some; planning to start aerobics          Dietician visit: Most recent:.2002 attended a class               Compliance with the medical regimen: Poor Retinal exam: Most recent: 12/13  Weight history: 125 upto 173, her lowest weight was in 11/2011  Wt Readings from Last 3 Encounters:  07/05/13 150 lb 11.2 oz (68.357 kg)  07/03/13 147 lb (66.679 kg)  06/19/13 143 lb (64.864 kg)      Medication List  This list is accurate as of: 07/05/13  4:08 PM.  Always use your most recent med list.               B-D ULTRAFINE III SHORT PEN 31G X 8 MM Misc  Generic drug:  Insulin Pen Needle  USE AS DIRECTED     BD PEN NEEDLE NANO U/F 32G X 4 MM Misc  Generic drug:  Insulin Pen Needle  USE AS DIRECTED     freestyle lancets  Use as instructed to check blood sugars 3 times per day     glucose blood test strip  Commonly known as:  FREESTYLE LITE  Use as instructed to check blood sugars 3 times per day dx code 250.02     Insulin Aspart Prot & Aspart (70-30) 100 UNIT/ML Pen  Commonly known as:  NOVOLOG MIX 70/30 FLEXPEN  Inject 10 Units into the skin 2 (two) times daily.     losartan 25 MG tablet  Commonly known as:  COZAAR  TAKE 1 TABLET EVERY DAY     magnesium hydroxide 400 MG/5ML suspension  Commonly known as:  MILK OF MAGNESIA  Take 30 mLs by mouth daily as needed.     metFORMIN 1000 MG tablet   Commonly known as:  GLUCOPHAGE  Take 1 tablet (1,000 mg total) by mouth 2 (two) times daily with a meal.        Allergies:  Allergies  Allergen Reactions  . Penicillins Hives    Back of neck and upper back.    Past Medical History  Diagnosis Date  . Hypertension   . Diabetes mellitus 12 yrs ago   . Biliary colic   . Anemia   . Constipation   . Unexplained weight loss   . Abdominal pain   . Vomiting   . Weakness     Past Surgical History  Procedure Laterality Date  . Wisdom tooth extraction    . Cesarean section  11/29/1990, 04/06/1994  . Knee surgery  11/2000    right  . Mouth surgery  04/04/1987  . Colonoscopy  09/01/2004    Family History  Problem Relation Age of Onset  . Cancer Mother     breast  . Diabetes Father   . Thyroid disease Sister     Social History:  reports that she has never smoked. She has never used smokeless tobacco. She reports that she does not drink alcohol or use illicit drugs.    Review of Systems       Lipids:  No results found for this basename: CHOL, HDL, LDLCALC, LDLDIRECT, TRIG, CHOLHDL     The blood pressure has been mildly high. On Cozaar 25 mg for uncertain duration of time, no monitoring at home       No history of Numbness, tingling or burning in her feet. Does feel coldness in her feet. Occasionally out of her feet may hurt at night.   Physical Examination:  BP 124/82  Pulse 87  Temp(Src) 98.3 F (36.8 C)  Resp 12  Ht 5\' 6"  (1.676 m)  Wt 150 lb 11.2 oz (68.357 kg)  BMI 24.34 kg/m2  SpO2 99%   ASSESSMENT:  Diabetes type 2   Her blood sugars are dramatically better with taking 10 units of premixed insulin twice a day and has fairly good recent glucose readings; also subjectively he is doing better Explained to her that her blurred vision will improve over time Currently taking 1000 mg metformin  twice daily  without side effects and will continue  Since she is not obese she may well be insulin deficient and  not clear if she can be weaned off although she is reluctant to continue   PLAN:   1.  Continue home glucose monitoring. Discussed frequency and timing of glucose monitoring especially need to do some readings after supper. She will bring her monitor for download on each visit 2.  Stay on NovoLog mix 70/30 insulin 10 units before  supper but reduce her dose to 8 units in the morning since he had low normal readings around lunchtime 3. Consultation for meal planning with dietitian to be scheduled 4. Check lipids on the next visit 5. Start exercise program, she will take the classes offered at her work and walk when possible    [facility] 07/05/2013, 4:08 PM

## 2013-07-05 NOTE — Patient Instructions (Signed)
Reduce am insulin to 8 units; stay on 10 before supper  Some more sugars 2 hrs after suuper

## 2013-07-28 ENCOUNTER — Other Ambulatory Visit (INDEPENDENT_AMBULATORY_CARE_PROVIDER_SITE_OTHER): Payer: BC Managed Care – PPO

## 2013-07-28 ENCOUNTER — Other Ambulatory Visit: Payer: BC Managed Care – PPO

## 2013-07-28 ENCOUNTER — Other Ambulatory Visit: Payer: Self-pay | Admitting: Internal Medicine

## 2013-07-28 DIAGNOSIS — E1165 Type 2 diabetes mellitus with hyperglycemia: Secondary | ICD-10-CM

## 2013-07-28 DIAGNOSIS — IMO0001 Reserved for inherently not codable concepts without codable children: Secondary | ICD-10-CM

## 2013-07-28 DIAGNOSIS — E010 Iodine-deficiency related diffuse (endemic) goiter: Secondary | ICD-10-CM

## 2013-07-28 DIAGNOSIS — R7989 Other specified abnormal findings of blood chemistry: Secondary | ICD-10-CM

## 2013-07-28 DIAGNOSIS — E049 Nontoxic goiter, unspecified: Secondary | ICD-10-CM

## 2013-07-28 LAB — COMPREHENSIVE METABOLIC PANEL
ALK PHOS: 67 U/L (ref 39–117)
ALT: 21 U/L (ref 0–35)
AST: 24 U/L (ref 0–37)
Albumin: 4.2 g/dL (ref 3.5–5.2)
BUN: 24 mg/dL — AB (ref 6–23)
CO2: 25 mEq/L (ref 19–32)
CREATININE: 2.8 mg/dL — AB (ref 0.4–1.2)
Calcium: 9.7 mg/dL (ref 8.4–10.5)
Chloride: 106 mEq/L (ref 96–112)
GFR: 23.17 mL/min — ABNORMAL LOW (ref >60.00)
Glucose, Bld: 47 mg/dL — CL (ref 70–99)
Potassium: 5.1 mEq/L (ref 3.5–5.1)
Sodium: 141 mEq/L (ref 135–145)
Total Bilirubin: 0.7 mg/dL (ref 0.3–1.2)
Total Protein: 8.4 g/dL — ABNORMAL HIGH (ref 6.0–8.3)

## 2013-07-28 LAB — MICROALBUMIN / CREATININE URINE RATIO
Creatinine,U: 81.2 mg/dL
Microalb Creat Ratio: 4.3 mg/g (ref 0.0–30.0)
Microalb, Ur: 3.5 mg/dL — ABNORMAL HIGH (ref 0.0–1.9)

## 2013-07-28 LAB — LIPID PANEL
CHOL/HDL RATIO: 5
CHOLESTEROL: 231 mg/dL — AB (ref 0–200)
HDL: 46.8 mg/dL (ref >39.00)
TRIGLYCERIDES: 62 mg/dL (ref 0.0–149.0)
VLDL: 12.4 mg/dL (ref 0.0–40.0)

## 2013-07-28 LAB — LDL CHOLESTEROL, DIRECT: LDL DIRECT: 175.6 mg/dL

## 2013-07-28 LAB — TSH: TSH: 2.75 u[IU]/mL (ref 0.35–5.50)

## 2013-07-28 LAB — T4, FREE: FREE T4: 0.9 ng/dL (ref 0.60–1.60)

## 2013-07-29 LAB — FRUCTOSAMINE: FRUCTOSAMINE: 368 umol/L — AB (ref <285)

## 2013-07-31 ENCOUNTER — Telehealth: Payer: Self-pay | Admitting: *Deleted

## 2013-07-31 NOTE — Telephone Encounter (Signed)
Patient was called Friday night with her lab results, she spoke with Call a nurse who instructed her not to take her metformin over the weekend and to call our office this morning with her readings from the weekend. Saturday at 9:10 it was 114 fasting, 8:04 pm 127 Sunday at 9:03 am it was 136 fasting  8:39 pm it was 172 Today at 6:41 it was 146

## 2013-07-31 NOTE — Telephone Encounter (Signed)
Her kidney test is abnormally high. She needs to see her PCP right away to evaluate this. Continue insulin unchanged and no metformin

## 2013-08-03 ENCOUNTER — Encounter: Payer: Self-pay | Admitting: Endocrinology

## 2013-08-03 ENCOUNTER — Ambulatory Visit (INDEPENDENT_AMBULATORY_CARE_PROVIDER_SITE_OTHER): Payer: BC Managed Care – PPO | Admitting: Endocrinology

## 2013-08-03 VITALS — BP 154/90 | HR 91 | Temp 98.3°F | Resp 16 | Ht 65.0 in | Wt 152.8 lb

## 2013-08-03 DIAGNOSIS — N289 Disorder of kidney and ureter, unspecified: Secondary | ICD-10-CM

## 2013-08-03 DIAGNOSIS — IMO0001 Reserved for inherently not codable concepts without codable children: Secondary | ICD-10-CM

## 2013-08-03 DIAGNOSIS — E1165 Type 2 diabetes mellitus with hyperglycemia: Secondary | ICD-10-CM

## 2013-08-03 NOTE — Progress Notes (Signed)
Patient ID: Sherry Hart, female   DOB: [birth year], 52 y.o.   MRN: [id number]   Reason for Appointment : Followup of Type 2 Diabetes  History of Present Illness          Diagnosis: Type 2 diabetes mellitus, date of diagnosis: 2002       Past history: She has been treated with metformin since diagnosis and previous records are not available for review. She thinks that when she was taking her blood sugar regularly there were usually below 150 in the morning In 2012 because of lack of insurance she did not take her medications or check her sugar for about a year In 6/13 she was evaluated in the emergency room for abdominal pain and was found to have an A1c of 14% with very high blood sugars. Her only symptoms were weight loss. She was given Lantus, unknown dose for about 2 months but she went off this on her own since she did not want to continue this while going to work. Also was presumably given glipizide at that time but no details available She was seen for initial consultation in 1/15 and at that time her glucose was 431 with A1c of 13.1  Recent history:  Because of her marked hyperglycemia with taking only 1000 mg metformin a day she was started on premixed insulin, 10 units of NovoLog mix 70/30 twice a day in 06/2013. With this regimen her blood sugars have been overall fairly well controlled However she does have some fluctuation in her blood sugars at both times Her weight has come up with insulin also She checking her blood sugars mostly before breakfast and after supper Last Friday because of her creatinine of 2.8 her metformin was stopped       Oral hypoglycemic drugs the patient is taking are: Metformin 1 g twice a day (on hold) Insulin regimen: NovoLog mix 70/30: 8 units breakfast--10 units dinner Side effects from medications have been:  none  Glucose monitoring:  done 2 times a day         Glucometer: Freestyle    Blood Glucose readings   PREMEAL Breakfast Lunch Dinner   p.c. dinner  Overall  Glucose range:  117-184   67, 89   91- 245    Mean/median:  142     137   Hypoglycemia:  lab glucose was low when she took her insulin and did not eat      Glycemic control:   Lab Results  Component Value Date   HGBA1C 13.1* 06/19/2013   HGBA1C 14.1* 11/21/2011   Lab Results  Component Value Date   MICROALBUR 3.5* 07/28/2013   CREATININE 2.8* 07/28/2013    Self-care: The diet that the patient has been following is: None, usually low fat. Eating cereal or oatmeal for breakfast,: Usually has fruit for snacks     Meals: 3 meals per day. Avoiding drinks with sugar           Exercise: walks some; planning to start aerobics          Dietician visit: Most recent:.2002 attended a class               Compliance with the medical regimen: Poor Retinal exam: Most recent: 12/13  Weight history: 125 upto 173, her lowest weight was in 11/2011  Wt Readings from Last 3 Encounters:  08/03/13 152 lb 12.8 oz (69.31 kg)  07/05/13 150 lb 11.2 oz (68.357 kg)  07/03/13 147 lb (66.679 kg)  PROBLEM 2: Renal insufficiency:  Her creatinine was unexpectedly high at 2.8 on 2/13, previously was 0.9 on 06/19/13 She was asked to see her PCP but has been not been able to get an appointment She says she has not taken any OTC nonsteroidal drugs like Advil or Aleve and no other prescription drugs She is continuing to take her losartan 25 mg which he has taken chronically. No recent history of urinary discomfort, flank pain or acute illness  Her labs do show a mildly increased total serum protein. She also has a history of anemia previously     Medication List       This list is accurate as of: 08/03/13  4:12 PM.  Always use your most recent med list.               B-D ULTRAFINE III SHORT PEN 31G X 8 MM Misc  Generic drug:  Insulin Pen Needle  USE AS DIRECTED     BD PEN NEEDLE NANO U/F 32G X 4 MM Misc  Generic drug:  Insulin Pen Needle  USE AS DIRECTED     freestyle lancets   Use as instructed to check blood sugars 3 times per day     glucose blood test strip  Commonly known as:  FREESTYLE LITE  Use as instructed to check blood sugars 3 times per day dx code 250.02     Insulin Aspart Prot & Aspart (70-30) 100 UNIT/ML Pen  Commonly known as:  NOVOLOG 70/30 MIX  Inject 8-10 Units into the skin 2 (two) times daily. 8 units in am and 10 units at night     losartan 25 MG tablet  Commonly known as:  COZAAR  TAKE 1 TABLET EVERY DAY     magnesium hydroxide 400 MG/5ML suspension  Commonly known as:  MILK OF MAGNESIA  Take 30 mLs by mouth daily as needed.     metFORMIN 1000 MG tablet  Commonly known as:  GLUCOPHAGE  Take 1 tablet (1,000 mg total) by mouth 2 (two) times daily with a meal.        Allergies:  Allergies  Allergen Reactions  . Penicillins Hives    Back of neck and upper back.    Past Medical History  Diagnosis Date  . Hypertension   . Diabetes mellitus 12 yrs ago   . Biliary colic   . Anemia   . Constipation   . Unexplained weight loss   . Abdominal pain   . Vomiting   . Weakness     Past Surgical History  Procedure Laterality Date  . Wisdom tooth extraction    . Cesarean section  11/29/1990, 04/06/1994  . Knee surgery  11/2000    right  . Mouth surgery  04/04/1987  . Colonoscopy  09/01/2004    Family History  Problem Relation Age of Onset  . Cancer Mother     breast  . Diabetes Father   . Thyroid disease Sister     Social History:  reports that she has never smoked. She has never used smokeless tobacco. She reports that she does not drink alcohol or use illicit drugs.    Review of Systems       Lipids: She has not been on any lipid-lowering drugs in the past and believes her lipids have not been very high previously She is very reluctant to consider lipid-lowering drugs  Lab Results  Component Value Date   CHOL 231* 07/28/2013   HDL 46.80 07/28/2013  LDLDIRECT 175.6 07/28/2013   TRIG 62.0 07/28/2013   CHOLHDL 5  07/28/2013      The blood pressure has been mildly high Previously but is relatively high today. On Cozaar 25 mg for uncertain duration of time, no  blood pressure monitoring at home  No significant history of neuropathy  Physical Examination:  BP 154/90  Pulse 91  Temp(Src) 98.3 F (36.8 C)  Resp 16  Ht _0  (1.651 m)  Wt 152 lb 12.8 oz (69.31 kg)  BMI 25.43 kg/m2  SpO2 98%   ASSESSMENT/PLAN:   Diabetes type 2   Her blood sugars areoverall fairly good with taking  low dose  premixed insulin twice a day She does have some fluctuation in her blood sugars but only occasionally over 180; her evening blood sugars are averaging about 160 postprandially She has not seen the dietitian and has made an appointment for review of her meal planning Fasting blood sugars are relatively good although again fluctuating as expected with premixed insulin  Explained to her that with her renal insufficiency it is difficult to consider non-insulin treatment at this time; most likely since she is requiring low doses and she is not obese she may be insulin deficient; blood sugars do not appear to be any higher with stopping metformin   Will continue her on the same insulin regimen for now   RENAL insufficiency: Etiology is unclear and would need to consider obstructive uropathy as well as possible multiple myeloma because of increased urine protein Will recheck her creatinine today to rule out lab error as well as check urinalysis, serum protein electrophoresis She will hold losartan She will followup with PCP next week  Increased blood pressure: She will followup with PCP next week for this as blood pressure has not been consistently high  Hypercholesterolemia: Discussed benefits of statin drugs especially with her LDL about 180 but she is reluctant to start anything at this time, will continue to talk to her about this  [facility] 08/03/2013, 4:12 PM

## 2013-08-03 NOTE — Patient Instructions (Addendum)
Stop Losartan  Please check blood sugars at least half the time about 2 hours after any meal and as directed on waking up. Please bring blood sugar monitor to each visit

## 2013-08-04 ENCOUNTER — Telehealth: Payer: Self-pay

## 2013-08-04 LAB — URINALYSIS
BILIRUBIN URINE: NEGATIVE
Hgb urine dipstick: NEGATIVE
Ketones, ur: NEGATIVE
LEUKOCYTES UA: NEGATIVE
Nitrite: NEGATIVE
PH: 7 (ref 5.0–8.0)
Specific Gravity, Urine: 1.015 (ref 1.000–1.030)
Total Protein, Urine: NEGATIVE
UROBILINOGEN UA: 0.2 (ref 0.0–1.0)
Urine Glucose: NEGATIVE

## 2013-08-04 LAB — BASIC METABOLIC PANEL
BUN: 10 mg/dL (ref 6–23)
CO2: 30 mEq/L (ref 19–32)
Calcium: 9.7 mg/dL (ref 8.4–10.5)
Chloride: 103 mEq/L (ref 96–112)
Creatinine, Ser: 0.9 mg/dL (ref 0.4–1.2)
GFR: 85.16 mL/min (ref >60.00)
Glucose, Bld: 109 mg/dL — ABNORMAL HIGH (ref 70–99)
POTASSIUM: 4 meq/L (ref 3.5–5.1)
SODIUM: 138 meq/L (ref 135–145)

## 2013-08-04 NOTE — Telephone Encounter (Signed)
[employer] mailed

## 2013-08-07 ENCOUNTER — Other Ambulatory Visit: Payer: Self-pay | Admitting: *Deleted

## 2013-08-07 ENCOUNTER — Ambulatory Visit: Payer: BC Managed Care – PPO | Admitting: Family Medicine

## 2013-08-07 MED ORDER — SAXAGLIPTIN-METFORMIN ER 2.5-1000 MG PO TB24: ORAL_TABLET | ORAL | Status: DC

## 2013-08-08 LAB — PROTEIN ELECTROPHORESIS, SERUM
Albumin ELP: 56 % (ref 55.8–66.1)
Alpha-1-Globulin: 3.3 % (ref 2.9–4.9)
Alpha-2-Globulin: 6.9 % — ABNORMAL LOW (ref 7.1–11.8)
Beta 2: 4.4 % (ref 3.2–6.5)
Beta Globulin: 6.9 % (ref 4.7–7.2)
Gamma Globulin: 22.5 % — ABNORMAL HIGH (ref 11.1–18.8)
TOTAL PROTEIN, SERUM ELECTROPHOR: 7.7 g/dL (ref 6.0–8.3)

## 2013-08-09 ENCOUNTER — Other Ambulatory Visit: Payer: Self-pay | Admitting: Endocrinology

## 2013-08-09 ENCOUNTER — Telehealth: Payer: Self-pay | Admitting: *Deleted

## 2013-08-09 NOTE — Telephone Encounter (Signed)
We may have co-pay card for $10. Also if this is not accepted she can try Janumet XR  50/1000, 2 tablets daily. We can give her a co-pay card for this also

## 2013-08-09 NOTE — Telephone Encounter (Signed)
Patient called today, she says she went to pick up her Kombiglyze at the pharmacy, but the cost is around $40 and that's a little too steep for her, she wants to know if there is a cheaper alternative?

## 2013-08-11 ENCOUNTER — Other Ambulatory Visit: Payer: Self-pay | Admitting: *Deleted

## 2013-08-22 ENCOUNTER — Other Ambulatory Visit: Payer: Self-pay | Admitting: Endocrinology

## 2013-08-24 ENCOUNTER — Other Ambulatory Visit (INDEPENDENT_AMBULATORY_CARE_PROVIDER_SITE_OTHER): Payer: BC Managed Care – PPO

## 2013-08-24 DIAGNOSIS — E1165 Type 2 diabetes mellitus with hyperglycemia: Principal | ICD-10-CM

## 2013-08-24 DIAGNOSIS — IMO0001 Reserved for inherently not codable concepts without codable children: Secondary | ICD-10-CM

## 2013-08-24 LAB — COMPREHENSIVE METABOLIC PANEL
ALT: 9 U/L (ref 0–35)
AST: 14 U/L (ref 0–37)
Albumin: 3.6 g/dL (ref 3.5–5.2)
Alkaline Phosphatase: 58 U/L (ref 39–117)
BILIRUBIN TOTAL: 0.3 mg/dL (ref 0.3–1.2)
BUN: 13 mg/dL (ref 6–23)
CO2: 29 mEq/L (ref 19–32)
CREATININE: 0.9 mg/dL (ref 0.4–1.2)
Calcium: 9.3 mg/dL (ref 8.4–10.5)
Chloride: 104 mEq/L (ref 96–112)
GFR: 87.4 mL/min (ref >60.00)
Glucose, Bld: 120 mg/dL — ABNORMAL HIGH (ref 70–99)
POTASSIUM: 4.2 meq/L (ref 3.5–5.1)
Sodium: 140 mEq/L (ref 135–145)
Total Protein: 8 g/dL (ref 6.0–8.3)

## 2013-08-24 LAB — HEMOGLOBIN A1C: Hgb A1c MFr Bld: 8.6 % — ABNORMAL HIGH (ref 4.6–6.5)

## 2013-08-24 MED ORDER — INSULIN PEN NEEDLE 32G X 4 MM MISC: Status: DC

## 2013-08-24 NOTE — Telephone Encounter (Signed)
Not a cause of sweating, keep eye on sugar

## 2013-08-24 NOTE — Addendum Note (Signed)
Addended by: Roxanna Mew on: 08/24/2013 09:01 AM   Modules accepted: Orders

## 2013-08-24 NOTE — Telephone Encounter (Signed)
Patient came to the lab today, she says on 2/26 she started with cold symptoms and ran a fever that day and broke out in a sweat., she said she also started taking the Kombiglyze and wasn't sure if that could cause sweating or not?  I told her it was probably unlikely but I would ask you to see what you thought?  Please advise

## 2013-08-30 ENCOUNTER — Ambulatory Visit (INDEPENDENT_AMBULATORY_CARE_PROVIDER_SITE_OTHER): Payer: BC Managed Care – PPO | Admitting: Endocrinology

## 2013-08-30 ENCOUNTER — Encounter: Payer: Self-pay | Admitting: Endocrinology

## 2013-08-30 VITALS — BP 142/82 | HR 95 | Temp 97.8°F | Resp 16 | Ht 65.0 in | Wt 152.8 lb

## 2013-08-30 DIAGNOSIS — IMO0001 Reserved for inherently not codable concepts without codable children: Secondary | ICD-10-CM

## 2013-08-30 DIAGNOSIS — E1165 Type 2 diabetes mellitus with hyperglycemia: Principal | ICD-10-CM

## 2013-08-30 MED ORDER — GLIMEPIRIDE 1 MG PO TABS: 1.0000 mg | ORAL_TABLET | Freq: Every day | ORAL | Status: DC

## 2013-08-30 NOTE — Progress Notes (Signed)
Patient ID: Sherry Hart, female   DOB: Jan 08, 1961, 53 y.o.   MRN: [id number]   Reason for Appointment : Followup of Type 2 Diabetes  History of Present Illness          Diagnosis: Type 2 diabetes mellitus, date of diagnosis: 2002       Past history: She had been treated with metformin since diagnosis and previous records are not available for review. She thinks that when she was taking her blood sugar regularly there were usually below 150 in the morning In 2012 because of lack of insurance she did not take her medications or check her sugar for about a year In 6/13 she was evaluated in the emergency room for abdominal pain and was found to have an A1c of 14% with very high blood sugars. Her only symptoms were weight loss. She was given Lantus, unknown dose for about 2 months but she went off this on her own since she did not want to continue this while going to work. Also was presumably given glipizide at that time but no details available She was seen for initial consultation in 1/15 and at that time her glucose was 431 with A1c of 13.1  Recent history:   She was switched from metformin to [facility] as she was desiring to taper off her insulin She started this on 08/08/13 and has been taking 1 tablet twice a day Initially her blood sugars were significantly higher but she had been off metformin because of a falsely high creatinine of 2.8 She ran out of her pen needles and has not taken any insulin for about 15 days now Surprisingly however her blood sugars have not increased significantly and now only sporadically high in the mornings or evenings She has no side effects from [facility] and she thinks she is feeling overall better Also she has had an upper respiratory infection for the last week or 2 but is getting better Complaining about the cost of Kombiglyze  She checking her blood sugars mostly before breakfast and either before or after supper       Oral hypoglycemic drugs  the patient is taking are: Kombiglyze Side effects from medications have been:  none  Glucose monitoring:  done 2 times a day         Glucometer: Freestyle    Blood Glucose readings since 08/21/13   PREMEAL Breakfast Lunch Dinner  p.c. dinner  Overall  Glucose range:  117-157    156   124-190    Mean/median:  141        Hypoglycemia: none    Glycemic control:   Lab Results  Component Value Date   HGBA1C 8.6* 08/24/2013   HGBA1C 13.1* 06/19/2013   HGBA1C 14.1* 11/21/2011   Lab Results  Component Value Date   MICROALBUR 3.5* 07/28/2013   CREATININE 0.9 08/24/2013   Self-care: The diet that the patient has been following is: None, usually low fat. Eating cereal or oatmeal for breakfast,: Usually has fruit for snacks     Meals: 3 meals per day. Avoiding drinks with sugar           Exercise: walks some; planning to start aerobics          Dietician visit: Most recent:.2002 attended a class               Compliance with the medical regimen: Poor Retinal exam: Most recent: 12/13  Weight history: 125 upto 173, her lowest weight was in  11/2011  Wt Readings from Last 3 Encounters:  08/30/13 152 lb 12.8 oz (69.31 kg)  08/03/13 152 lb 12.8 oz (69.31 kg)  07/05/13 150 lb 11.2 oz (68.357 kg)       Medication List       This list is accurate as of: 08/30/13  3:52 PM.  Always use your most recent med list.               freestyle lancets  Use as instructed to check blood sugars 3 times per day     glucose blood test strip  Commonly known as:  FREESTYLE LITE  Use as instructed to check blood sugars 3 times per day dx code 250.02     Insulin Aspart Prot & Aspart (70-30) 100 UNIT/ML Pen  Commonly known as:  NOVOLOG 70/30 MIX  Inject 8-10 Units into the skin 2 (two) times daily. 8 units in am and 10 units at night     Insulin Pen Needle 32G X 4 MM Misc  Use two pen needles per day with inulin dx code 250.02     losartan 25 MG tablet  Commonly known as:  COZAAR  TAKE 1 TABLET  EVERY DAY     magnesium hydroxide 400 MG/5ML suspension  Commonly known as:  MILK OF MAGNESIA  Take 30 mLs by mouth daily as needed.     metFORMIN 1000 MG tablet  Commonly known as:  GLUCOPHAGE  TAKE 1 TABLET BY MOUTH TWICE A DAY WITH A MEAL     Saxagliptin-Metformin 2.10-998 MG Tb24  Take 2 tablets daily        Allergies:  Allergies  Allergen Reactions  . Penicillins Hives    Back of neck and upper back.    Past Medical History  Diagnosis Date  . Hypertension   . Diabetes mellitus 12 yrs ago   . Biliary colic   . Anemia   . Constipation   . Unexplained weight loss   . Abdominal pain   . Vomiting   . Weakness     Past Surgical History  Procedure Laterality Date  . Wisdom tooth extraction    . Cesarean section  11/29/1990, 04/06/1994  . Knee surgery  11/2000    right  . Mouth surgery  04/04/1987  . Colonoscopy  09/01/2004    Family History  Problem Relation Age of Onset  . Cancer Mother     breast  . Diabetes Father   . Thyroid disease Sister     Social History:  reports that she has never smoked. She has never used smokeless tobacco. She reports that she does not drink alcohol or use illicit drugs.    Review of Systems       Lipids: She has not been on any lipid-lowering drugs in the past and believes her lipids have not been very high previously She is very reluctant to consider lipid-lowering drugs  Lab Results  Component Value Date   CHOL 231* 07/28/2013   HDL 46.80 07/28/2013   LDLDIRECT 175.6 07/28/2013   TRIG 62.0 07/28/2013   CHOLHDL 5 07/28/2013      The blood pressure has been mildly increased  On Cozaar 25 mg for uncertain duration of time, no  blood pressure monitoring at home  No significant history of neuropathy   Physical Examination:  BP 142/82  Pulse 95  Temp(Src) 97.8 F (36.6 C)  Resp 16  Ht 5\' 5"  (1.651 m)  Wt 152 lb 12.8 oz (69.31 kg)  BMI 25.43 kg/m2  SpO2 96%   ASSESSMENT/PLAN:   Diabetes type 2   Her blood  sugars are overall fairly good with taking Kombiglyze alone recently Surprisingly her sugars have not escalated with her stopping insulin inadvertently However her sugars are still modestly increased with the highest level of 190; she has not checked enough readings after meals especially evening Most likely she does have restored some of her insulin secretion She can continue her Kombiglyze but will add the lowest doses of 0.5 mg Amaryl at dinnertime to help with beta cell function Advised her to check blood sugars more regularly and also call if blood sugars are low normal She is also going to start doing some walking for exercise  Sherry Hart 08/30/2013, 3:52 PM

## 2013-08-30 NOTE — Patient Instructions (Addendum)
Please check blood sugars at least half the time about 2 hours after any meal and as directed on waking up. Please bring blood sugar monitor to each visit  Glimeperide 1mg , 1/2 tab at dinner: may stop if sugar goes below 70  May take both Kombiglyze together  Check cost of Janumet or [employer]

## 2013-09-06 LAB — HM DIABETES EYE EXAM

## 2013-10-11 ENCOUNTER — Ambulatory Visit (INDEPENDENT_AMBULATORY_CARE_PROVIDER_SITE_OTHER): Payer: BC Managed Care – PPO | Admitting: Endocrinology

## 2013-10-11 ENCOUNTER — Encounter: Payer: Self-pay | Admitting: Endocrinology

## 2013-10-11 VITALS — BP 162/86 | HR 96 | Temp 97.8°F | Resp 16 | Ht 65.0 in | Wt 153.2 lb

## 2013-10-11 DIAGNOSIS — E1165 Type 2 diabetes mellitus with hyperglycemia: Principal | ICD-10-CM

## 2013-10-11 DIAGNOSIS — IMO0001 Reserved for inherently not codable concepts without codable children: Secondary | ICD-10-CM

## 2013-10-11 DIAGNOSIS — E785 Hyperlipidemia, unspecified: Secondary | ICD-10-CM

## 2013-10-11 DIAGNOSIS — I10 Essential (primary) hypertension: Secondary | ICD-10-CM

## 2013-10-11 MED ORDER — LOSARTAN POTASSIUM 50 MG PO TABS: 50.0000 mg | ORAL_TABLET | Freq: Every day | ORAL | Status: DC

## 2013-10-11 NOTE — Patient Instructions (Signed)
Skip Glimeperide when fasting  Alternate am and after dinner sugars  Restart Losartan 50mg 

## 2013-10-11 NOTE — Progress Notes (Signed)
Patient ID: Sherry Hart, female   DOB: 12-Feb-1961, 53 y.o.   MRN: [id number]   Reason for Appointment : Followup of Type 2 Diabetes  History of Present Illness          Diagnosis: Type 2 diabetes mellitus, date of diagnosis: 2002       Past history: She had been treated with metformin since diagnosis and previous records are not available for review. She thinks that when she was taking her blood sugar regularly there were usually below 150 in the morning In 2012 because of lack of insurance she did not take her medications or check her sugar for about a year In 6/13 she was evaluated in the emergency room for abdominal pain and was found to have an A1c of 14% with very high blood sugars. Her only symptoms were weight loss. She was given Lantus, unknown dose for about 2 months but she went off this on her own since she did not want to continue this while going to work. Also was presumably given glipizide at that time but no details available She was seen for initial consultation in 1/15 and at that time her glucose was 431 with A1c of 13.1  Recent history:  She started Kombiglyze XR on 08/08/13 and has been taking 1 tablet twice a day With this she has been able to taper off her low dose insulin without significant hyperglycemia Because of tendency to relatively higher fasting readings she was given low-dose Amaryl in 3/15 With this she has had overall better readings after supper and in the morning although she is not again checking her readings during the day after breakfast or lunch A1c had come down below 9% in 3/15 from baseline of 13 earlier She has had one low blood sugar in the evening which was related to her fasting during the day and still taking her Amaryl the evening Has had only occasional readings around 180-190       Oral hypoglycemic drugs the patient is taking are: Kombiglyze Side effects from medications have been:  none  Glucose monitoring:  done 2 times a day          Glucometer: Freestyle    Blood Glucose readings   PREMEAL Breakfast Lunch Dinner pcs Overall  Glucose range: 101-182 ?  ?  42-195   Mean/median: 124   130 126   Hypoglycemia: Once as above    Glycemic control:   Lab Results  Component Value Date   HGBA1C 8.6* 08/24/2013   HGBA1C 13.1* 06/19/2013   HGBA1C 14.1* 11/21/2011   Lab Results  Component Value Date   MICROALBUR 3.5* 07/28/2013   CREATININE 0.9 08/24/2013   Self-care: The diet that the patient has been following is: None, usually low fat. Eating cereal or oatmeal for breakfast,: Usually has fruit for snacks     Meals: 3 meals per day. Avoiding drinks with sugar, dinner 5-7 pm           Exercise: walks more now          Dietician visit: Most recent:.2002 attended a class               Compliance with the medical regimen: Poor Retinal exam: Most recent: 12/13  Weight history: 125 upto 173, her lowest weight was in 11/2011  Wt Readings from Last 3 Encounters:  10/11/13 153 lb 3.2 oz (69.491 kg)  08/30/13 152 lb 12.8 oz (69.31 kg)  08/03/13 152 lb 12.8 oz (69.31 kg)  Medication List       This list is accurate as of: 10/11/13  3:58 PM.  Always use your most recent med list.               freestyle lancets  Use as instructed to check blood sugars 3 times per day     glimepiride 1 MG tablet  Commonly known as:  AMARYL  Take 1 mg by mouth daily before supper. Take 1/2 tablet daily     glucose blood test strip  Commonly known as:  FREESTYLE LITE  Use as instructed to check blood sugars 3 times per day dx code 250.02     Insulin Aspart Prot & Aspart (70-30) 100 UNIT/ML Pen  Commonly known as:  NOVOLOG 70/30 MIX  Inject 8-10 Units into the skin 2 (two) times daily. 8 units in am and 10 units at night     Insulin Pen Needle 32G X 4 MM Misc  Use two pen needles per day with inulin dx code 250.02     losartan 25 MG tablet  Commonly known as:  COZAAR  TAKE 1 TABLET EVERY DAY     magnesium hydroxide 400  MG/5ML suspension  Commonly known as:  MILK OF MAGNESIA  Take 30 mLs by mouth daily as needed.     Saxagliptin-Metformin 2.10-998 MG Tb24  Take 2 tablets daily        Allergies:  Allergies  Allergen Reactions  . Penicillins Hives    Back of neck and upper back.    Past Medical History  Diagnosis Date  . Hypertension   . Diabetes mellitus 12 yrs ago   . Biliary colic   . Anemia   . Constipation   . Unexplained weight loss   . Abdominal pain   . Vomiting   . Weakness     Past Surgical History  Procedure Laterality Date  . Wisdom tooth extraction    . Cesarean section  11/29/1990, 04/06/1994  . Knee surgery  11/2000    right  . Mouth surgery  04/04/1987  . Colonoscopy  09/01/2004    Family History  Problem Relation Age of Onset  . Cancer Mother     breast  . Diabetes Father   . Thyroid disease Sister     Social History:  reports that she has never smoked. She has never used smokeless tobacco. She reports that she does not drink alcohol or use illicit drugs.    Review of Systems       Lipids: She has not been on any lipid-lowering drugs in the past . She is very reluctant to consider lipid-lowering drugs despite significantly high LDL  Lab Results  Component Value Date   CHOL 231* 07/28/2013   HDL 46.80 07/28/2013   LDLDIRECT 175.6 07/28/2013   TRIG 62.0 07/28/2013   CHOLHDL 5 07/28/2013      The blood pressure has been mildly increased previously but it is much higher today. She thinks this is from a stressful day Her Cozaar was stopped because of relatively good readings in no history of microalbuminuria Home readings are about 638-756 systolic and 43+ diastolic  No significant history of neuropathy   Physical Examination:  BP 162/86  Pulse 96  Temp(Src) 97.8 F (36.6 C)  Resp 16  Ht 5\' 5"  (1.651 m)  Wt 153 lb 3.2 oz (69.491 kg)  BMI 25.49 kg/m2  SpO2 97%  Repeat blood pressure 160/88  ASSESSMENT/PLAN:   Diabetes type 2  Her blood sugars  are overall fairly good with taking Kombiglyze maximum dose and 0.5 mg Amaryl in the evening This is despite her being off insulin for some time which was used for her marked hyperglycemia She does need to be seen by a dietitian but she is concerned about the cost of this Discussed blood sugar targets, avoiding hypoglycemia with not taking Amaryl on the day she is fasting.  Hypertension: Blood pressure is markedly increased and is still relatively high at home also. Will start her on Cozaar 50 mg daily  Elayne Snare 10/11/2013, 3:58 PM

## 2013-12-06 ENCOUNTER — Other Ambulatory Visit: Payer: Self-pay | Admitting: *Deleted

## 2013-12-06 ENCOUNTER — Telehealth: Payer: Self-pay

## 2013-12-06 MED ORDER — SAXAGLIPTIN-METFORMIN ER 2.5-1000 MG PO TB24
ORAL_TABLET | ORAL | Status: DC
Start: 2013-12-06 — End: 2014-02-09

## 2013-12-06 NOTE — Telephone Encounter (Signed)
Pt called requesting a refill on her Saxagliptin-Metformin 2.10-998 mg. Pt states that she will be out of her medication by Sunday. She has an appointment on 01/10/2014 and wanted to her a refill to last her until that appointment.  Pt would like of the Rx to go to [facility] on [address].

## 2013-12-06 NOTE — Telephone Encounter (Signed)
rx sent

## 2014-01-05 ENCOUNTER — Other Ambulatory Visit (INDEPENDENT_AMBULATORY_CARE_PROVIDER_SITE_OTHER): Payer: BC Managed Care – PPO

## 2014-01-05 DIAGNOSIS — E785 Hyperlipidemia, unspecified: Secondary | ICD-10-CM

## 2014-01-05 DIAGNOSIS — IMO0001 Reserved for inherently not codable concepts without codable children: Secondary | ICD-10-CM

## 2014-01-05 DIAGNOSIS — E1165 Type 2 diabetes mellitus with hyperglycemia: Principal | ICD-10-CM

## 2014-01-05 LAB — HEMOGLOBIN A1C: HEMOGLOBIN A1C: 6.8 % — AB (ref 4.6–6.5)

## 2014-01-05 LAB — BASIC METABOLIC PANEL
BUN: 13 mg/dL (ref 6–23)
CHLORIDE: 102 meq/L (ref 96–112)
CO2: 30 meq/L (ref 19–32)
Calcium: 9.5 mg/dL (ref 8.4–10.5)
Creatinine, Ser: 0.9 mg/dL (ref 0.4–1.2)
GFR: 80.81 mL/min (ref >60.00)
GLUCOSE: 161 mg/dL — AB (ref 70–99)
POTASSIUM: 4.2 meq/L (ref 3.5–5.1)
Sodium: 137 mEq/L (ref 135–145)

## 2014-01-05 LAB — LIPID PANEL
Cholesterol: 229 mg/dL — ABNORMAL HIGH (ref 0–200)
HDL: 32.5 mg/dL — AB (ref >39.00)
LDL Cholesterol: 166 mg/dL — ABNORMAL HIGH (ref 0–99)
NonHDL: 196.5
TRIGLYCERIDES: 153 mg/dL — AB (ref 0.0–149.0)
Total CHOL/HDL Ratio: 7
VLDL: 30.6 mg/dL (ref 0.0–40.0)

## 2014-01-10 ENCOUNTER — Ambulatory Visit (INDEPENDENT_AMBULATORY_CARE_PROVIDER_SITE_OTHER): Payer: BC Managed Care – PPO | Admitting: Endocrinology

## 2014-01-10 ENCOUNTER — Encounter: Payer: Self-pay | Admitting: Endocrinology

## 2014-01-10 ENCOUNTER — Other Ambulatory Visit: Payer: Self-pay | Admitting: *Deleted

## 2014-01-10 VITALS — BP 149/85 | HR 101 | Temp 98.2°F | Resp 16 | Ht 65.0 in | Wt 159.2 lb

## 2014-01-10 DIAGNOSIS — E785 Hyperlipidemia, unspecified: Secondary | ICD-10-CM

## 2014-01-10 DIAGNOSIS — IMO0001 Reserved for inherently not codable concepts without codable children: Secondary | ICD-10-CM

## 2014-01-10 DIAGNOSIS — E1165 Type 2 diabetes mellitus with hyperglycemia: Principal | ICD-10-CM

## 2014-01-10 DIAGNOSIS — I10 Essential (primary) hypertension: Secondary | ICD-10-CM

## 2014-01-10 MED ORDER — ATORVASTATIN CALCIUM 10 MG PO TABS: 10.0000 mg | ORAL_TABLET | Freq: Every day | ORAL | Status: DC

## 2014-01-10 MED ORDER — GLIMEPIRIDE 1 MG PO TABS: ORAL_TABLET | ORAL | Status: DC

## 2014-01-10 MED ORDER — LOSARTAN POTASSIUM 100 MG PO TABS: 100.0000 mg | ORAL_TABLET | Freq: Every day | ORAL | Status: DC

## 2014-01-10 MED ORDER — GLIMEPIRIDE 1 MG PO TABS: 1.0000 mg | ORAL_TABLET | Freq: Every day | ORAL | Status: DC

## 2014-01-10 NOTE — Patient Instructions (Signed)
Please check blood sugars at least half the time about 2 hours after any meal and times per week on waking up. Please bring blood sugar monitor to each visit  Walk more  Avoid fatty meats and high fat daily products

## 2014-01-10 NOTE — Progress Notes (Signed)
Patient ID: Leeroy Cha, female   DOB: [birth year], 53 y.o.   MRN: [id number]   Reason for Appointment : Followup of Type 2 Diabetes  History of Present Illness          Diagnosis: Type 2 diabetes mellitus, date of diagnosis: 2002       Past history: She had been treated with metformin since diagnosis and previous records are not available for review. She thinks that when she was taking her blood sugar regularly there were usually below 150 in the morning In 2012 because of lack of insurance she did not take her medications or check her sugar for about a year In 6/13 she was evaluated in the emergency room for abdominal pain and was found to have an A1c of 14% with very high blood sugars. Her only symptoms were weight loss. She was given Lantus, unknown dose for about 2 months but she went off this on her own since she did not want to continue this while going to work. Also was presumably given glipizide at that time but no details available She was seen for initial consultation in 1/15 and at that time her glucose was 431 with A1c of 13.1 Initially treated with insulin  Recent history:  She started taking Kombiglyze XR on 08/08/13 and has been taking 1 tablet twice a day She has not been on insulin after starting this Because of tendency to relatively higher fasting readings she was given low-dose Amaryl in 3/15 More recently her blood sugars are slightly higher in the morning compared to the evening However has an A1c of below 7% and now Did not bring her monitor for review today but she thinks her blood sugars are fairly good overall Has started to gain some weight however       Oral hypoglycemic drugs the patient is taking are: Kombiglyze Side effects from medications have been:  none  Glucose monitoring:  done 2 times a day         Glucometer: Freestyle    Blood Glucose readings from recall  PREMEAL Breakfast Lunch Dinner Bedtime Overall  Glucose range: 95-140   128    Mean/median:         Glycemic control:   Lab Results  Component Value Date   HGBA1C 6.8* 01/05/2014   HGBA1C 8.6* 08/24/2013   HGBA1C 13.1* 06/19/2013   Lab Results  Component Value Date   MICROALBUR 3.5* 07/28/2013   LDLCALC 166* 01/05/2014   CREATININE 0.9 01/05/2014   Self-care: The diet that the patient has been following is: , usually low fat. Eating cereal or oatmeal for breakfast,: Usually has fruit for snacks     Meals: 3 meals per day. Avoiding drinks with sugar, dinner is at 5-7 pm           Exercise: walks a little     Dietician visit: Most recent:.2002 attended a class               Compliance with the medical regimen: Poor Retinal exam: Most recent: 12/13  Weight history: 125 upto 173, her lowest weight was in 11/2011  Wt Readings from Last 3 Encounters:  01/10/14 159 lb 3.2 oz (72.213 kg)  10/11/13 153 lb 3.2 oz (69.491 kg)  08/30/13 152 lb 12.8 oz (69.31 kg)       Medication List       This list is accurate as of: 01/10/14 10:39 AM.  Always use your most recent med list.  freestyle lancets  Use as instructed to check blood sugars 3 times per day     glimepiride 1 MG tablet  Commonly known as:  AMARYL  Take 1 mg by mouth daily before supper. Take 1/2 tablet daily     glucose blood test strip  Commonly known as:  FREESTYLE LITE  Use as instructed to check blood sugars 3 times per day dx code 250.02     Insulin Aspart Prot & Aspart (70-30) 100 UNIT/ML Pen  Commonly known as:  NOVOLOG 70/30 MIX  Inject 8-10 Units into the skin 2 (two) times daily. 8 units in am and 10 units at night     Insulin Pen Needle 32G X 4 MM Misc  Use two pen needles per day with inulin dx code 250.02     losartan 50 MG tablet  Commonly known as:  COZAAR  Take 1 tablet (50 mg total) by mouth daily.     magnesium hydroxide 400 MG/5ML suspension  Commonly known as:  MILK OF MAGNESIA  Take 30 mLs by mouth daily as needed.     Saxagliptin-Metformin 2.10-998 MG  Tb24  Take 2 tablets daily        Allergies:  Allergies  Allergen Reactions  . Penicillins Hives    Back of neck and upper back.    Past Medical History  Diagnosis Date  . Hypertension   . Diabetes mellitus 12 yrs ago   . Biliary colic   . Anemia   . Constipation   . Unexplained weight loss   . Abdominal pain   . Vomiting   . Weakness     Past Surgical History  Procedure Laterality Date  . Wisdom tooth extraction    . Cesarean section  11/29/1990, 04/06/1994  . Knee surgery  11/2000    right  . Mouth surgery  04/04/1987  . Colonoscopy  09/01/2004    Family History  Problem Relation Age of Onset  . Cancer Mother     breast  . Diabetes Father   . Thyroid disease Sister     Social History:  reports that she has never smoked. She has never used smokeless tobacco. She reports that she does not drink alcohol or use illicit drugs.    Review of Systems       Lipids: She has not been on any lipid-lowering drugs in the past . She has previously been reluctant to consider lipid-lowering drugs despite significantly high LDL  Lab Results  Component Value Date   CHOL 229* 01/05/2014   HDL 32.50* 01/05/2014   LDLCALC 166* 01/05/2014   LDLDIRECT 175.6 07/28/2013   TRIG 153.0* 01/05/2014   CHOLHDL 7 01/05/2014      The blood pressure has been higher this year Her Cozaar was restarted at 50 mg on her last visit but pressure appears to be still relatively high Home readings are about 086-761 systolic and 95+ diastolic  She has had a vague discomfort in her feet, better now  No significant history of neuropathy   Physical Examination:  BP 149/85  Pulse 101  Temp(Src) 98.2 F (36.8 C)  Resp 16  Ht 5\' 5"  (1.651 m)  Wt 159 lb 3.2 oz (72.213 kg)  BMI 26.49 kg/m2  SpO2 98%  No pedal edema  ASSESSMENT/PLAN:   Diabetes type 2   Her blood sugars are overall fairly good with taking Kombiglyze maximum dose and 0.5 mg Amaryl in the evening A1c is below 7% Not clear  why she  is gaining weight even though she thinks she is watching her diet She does need to be seen by a dietitian but she is concerned about the cost of this Discussed blood sugar targets, and timing of glucose monitoring. She will need to bring her monitor on the next visit  Hypertension: Blood pressure is still somewhat increased and is  relatively high at home also.  Will increase her Cozaar 100 mg daily No history of microalbuminuria  Hyperlipidemia: She has significantly high LDL along with low HDL. Discussed implications of hyperlipidemia with diabetes and that she has 2 other risk factors now Discussed reduction of cardiovascular risk with statin drugs Will start her on Lipitor 10 mg and also discussed benefits and possible side effects  Advised her to see her PCP for symptoms of cough  Counseling time over 50% of today's 25 minute visit  Alandis Bluemel 01/10/2014, 10:39 AM

## 2014-01-29 ENCOUNTER — Encounter: Payer: Self-pay | Admitting: Family Medicine

## 2014-01-29 ENCOUNTER — Ambulatory Visit
Admission: RE | Admit: 2014-01-29 | Discharge: 2014-01-29 | Disposition: A | Discharge status: Home / Self Care | Payer: BC Managed Care – PPO | Source: Ambulatory Visit | Attending: Family Medicine | Admitting: Family Medicine

## 2014-01-29 ENCOUNTER — Ambulatory Visit (INDEPENDENT_AMBULATORY_CARE_PROVIDER_SITE_OTHER): Payer: BC Managed Care – PPO | Admitting: Family Medicine

## 2014-01-29 VITALS — BP 110/80 | HR 80 | Temp 98.3°F | Resp 16 | Ht 65.0 in | Wt 159.0 lb

## 2014-01-29 DIAGNOSIS — R059 Cough, unspecified: Secondary | ICD-10-CM

## 2014-01-29 DIAGNOSIS — R053 Chronic cough: Secondary | ICD-10-CM

## 2014-01-29 DIAGNOSIS — Z0000 Encounter for general adult medical examination without abnormal findings: Secondary | ICD-10-CM

## 2014-01-29 DIAGNOSIS — R05 Cough: Secondary | ICD-10-CM

## 2014-01-29 MED ORDER — PANTOPRAZOLE SODIUM 40 MG PO TBEC: 40.0000 mg | DELAYED_RELEASE_TABLET | Freq: Every day | ORAL | Status: DC

## 2014-01-29 NOTE — Progress Notes (Signed)
Subjective:    Patient ID: Sherry Hart, female    DOB: May 14, 1961, 53 y.o.   MRN: [id number]  HPI Patient is here today for complete physical exam. Her Pap smear was performed earlier this year. Her mammogram was performed in January. Her colonoscopy was performed 7 years ago and is up to date. Her diabetes is managed by Dr. Dwyane Dee. Her hemoglobin A1c is well controlled at 6.8. Her LDL cholesterol is elevated at 166. She recently began Lipitor although she is not yet due to recheck it again. Her diabetic eye exam is up-to-date. She does report 6 months of chronic nonproductive cough. It is described as a tickle in the back of her throat. She also reports some indigestion early satiety. 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 Past Medical History  Diagnosis Date  . Hypertension   . Diabetes mellitus 12 yrs ago   . Biliary colic   . Anemia   . Constipation   . Unexplained weight loss   . Abdominal pain   . Vomiting   . Weakness    Past Surgical History  Procedure Laterality Date  . Wisdom tooth extraction    . Cesarean section  11/29/1990, 04/06/1994  . Knee surgery  11/2000    right  . Mouth surgery  04/04/1987  . Colonoscopy  09/01/2004   Current Outpatient Prescriptions on File Prior to Visit  Medication Sig Dispense Refill  . atorvastatin (LIPITOR) 10 MG tablet Take 1 tablet (10 mg total) by mouth daily.  30 tablet  3  . glimepiride (AMARYL) 1 MG tablet Take 1/2 tablet daily  15 tablet  3  . losartan (COZAAR) 100 MG tablet Take 1 tablet (100 mg total) by mouth daily.  30 tablet  2  . Saxagliptin-Metformin 2.10-998 MG TB24 Take 2 tablets daily  60 tablet  1  . glucose blood (FREESTYLE LITE) test strip Use as instructed to check blood sugars 3 times per day dx code 250.02  100 each  5  . Insulin Aspart Prot & Aspart (NOVOLOG 70/30 MIX) (70-30) 100 UNIT/ML Pen Inject 8-10 Units into the skin 2 (two) times daily. 8 units in am and 10 units at night      . Insulin Pen  Needle 32G X 4 MM MISC Use two pen needles per day with inulin dx code 250.02  100 each  1  . Lancets (FREESTYLE) lancets Use as instructed to check blood sugars 3 times per day  100 each  5  . magnesium hydroxide (MILK OF MAGNESIA) 400 MG/5ML suspension Take 30 mLs by mouth daily as needed.       No current facility-administered medications on file prior to visit.   Allergies  Allergen Reactions  . Penicillins Hives    Back of neck and upper back.   History   Social History  . Marital Status: Married    Spouse Name: N/A    Number of Children: N/A  . Years of Education: N/A   Occupational History  . Not on file.   Social History Main Topics  . Smoking status: Never Smoker   . Smokeless tobacco: Never Used  . Alcohol Use: No  . Drug Use: No  . Sexual Activity: Not on file   Other Topics Concern  . Not on file   Social History Narrative  . No narrative on file   Family History  Problem Relation Age of Onset  . Cancer Mother     breast  .  Diabetes Father   . Thyroid disease Sister   . Hypertension Neg Hx       Review of Systems  All other systems reviewed and are negative.      Objective:   Physical Exam  Vitals reviewed. Constitutional: She is oriented to person, place, and time. She appears well-developed and well-nourished. No distress.  HENT:  Head: Normocephalic and atraumatic.  Right Ear: External ear normal.  Left Ear: External ear normal.  Nose: Nose normal.  Mouth/Throat: Oropharynx is clear and moist. No oropharyngeal exudate.  Eyes: Conjunctivae and EOM are normal. Pupils are equal, round, and reactive to light. Right eye exhibits no discharge. Left eye exhibits no discharge. No scleral icterus.  Neck: Normal range of motion. Neck supple. No JVD present. No thyromegaly present.  Cardiovascular: Normal rate, regular rhythm, normal heart sounds and intact distal pulses.  Exam reveals no gallop and no friction rub.   No murmur  heard. Pulmonary/Chest: Effort normal and breath sounds normal. No stridor. No respiratory distress. She has no wheezes. She has no rales. She exhibits no tenderness.  Abdominal: Soft. Bowel sounds are normal. She exhibits no distension and no mass. There is no tenderness. There is no rebound and no guarding.  Musculoskeletal: Normal range of motion. She exhibits no edema and no tenderness.  Lymphadenopathy:    She has no cervical adenopathy.  Neurological: She is alert and oriented to person, place, and time. She has normal reflexes. She displays normal reflexes. No cranial nerve deficit. She exhibits normal muscle tone. Coordination normal.  Skin: Skin is warm. No rash noted. She is not diaphoretic. No erythema. No pallor.  Psychiatric: She has a normal mood and affect. Her behavior is normal. Judgment and thought content normal.          Assessment & Plan:  1. Chronic cough I believe the cough is due to laryngo-esophageal reflux disease. I will check a chest x-ray. Also check antibody titers for possible treasured whooping cough. Meanwhile I will treat the patient with protonic 40 mg by mouth daily and recheck in 1 month - DG Chest 2 View; Future - Bordetella pertussis antibody - pantoprazole (PROTONIX) 40 MG tablet; Take 1 tablet (40 mg total) by mouth daily.  Dispense: 30 tablet; Refill: 3  2. Routine general medical examination at a health care facility Physical exam is normal. I like to recheck her cholesterol in 3 months. Cancer screening is up to date. Immunizations are up to date. Regular and his guidance is provided.

## 2014-02-01 LAB — BORDETELLA PERTUSSIS ANTIBODY
B pertussis IgA Ab, Quant: 0.6 U/mL (ref <=1.1)
B pertussis IgG Ab, Quant: 3.3 U/mL — ABNORMAL HIGH (ref 0.0–0.9)
B pertussis IgM Ab, Quant: 3.8 U/mL — ABNORMAL HIGH (ref <=1.1)

## 2014-02-03 ENCOUNTER — Other Ambulatory Visit: Payer: Self-pay | Admitting: *Deleted

## 2014-02-03 LAB — BORDETELLA PERTUSSIS, IGM BY IB (RFLX)
B PERTUSSIS, IGM IBA FHA: NEGATIVE
B PERTUSSIS, IGM IBA INTERP: NEGATIVE
B pertussis, IgM IBA PT: NEGATIVE

## 2014-02-03 LAB — BORDETELLA PERTUSSIS, IGG BY IB (RFLX)
B pertussis, IgG: POSITIVE
B. pertussis IgG IBA PT100: NEGATIVE
B. pertussis IgG IBA PT: UNDETERMINED

## 2014-02-03 MED ORDER — AZITHROMYCIN 250 MG PO TABS: ORAL_TABLET | ORAL | Status: DC

## 2014-02-05 ENCOUNTER — Encounter: Payer: Self-pay | Admitting: Family Medicine

## 2014-02-09 ENCOUNTER — Telehealth: Payer: Self-pay | Admitting: Endocrinology

## 2014-02-09 MED ORDER — SAXAGLIPTIN-METFORMIN ER 2.5-1000 MG PO TB24: ORAL_TABLET | ORAL | Status: DC

## 2014-02-09 NOTE — Telephone Encounter (Signed)
Rx sent per pt's request.  

## 2014-02-09 NOTE — Telephone Encounter (Signed)
Pt needs refill of kombiglyze xr and metformin called in please

## 2014-04-09 ENCOUNTER — Other Ambulatory Visit (INDEPENDENT_AMBULATORY_CARE_PROVIDER_SITE_OTHER): Payer: BC Managed Care – PPO

## 2014-04-09 ENCOUNTER — Other Ambulatory Visit: Payer: Self-pay | Admitting: Endocrinology

## 2014-04-09 DIAGNOSIS — E785 Hyperlipidemia, unspecified: Secondary | ICD-10-CM

## 2014-04-09 DIAGNOSIS — E1165 Type 2 diabetes mellitus with hyperglycemia: Secondary | ICD-10-CM

## 2014-04-09 DIAGNOSIS — IMO0002 Reserved for concepts with insufficient information to code with codable children: Secondary | ICD-10-CM

## 2014-04-09 LAB — COMPREHENSIVE METABOLIC PANEL
ALT: 13 U/L (ref 0–35)
AST: 23 U/L (ref 0–37)
Albumin: 3.7 g/dL (ref 3.5–5.2)
Alkaline Phosphatase: 74 U/L (ref 39–117)
BUN: 15 mg/dL (ref 6–23)
CALCIUM: 9.2 mg/dL (ref 8.4–10.5)
CHLORIDE: 101 meq/L (ref 96–112)
CO2: 29 meq/L (ref 19–32)
CREATININE: 1.1 mg/dL (ref 0.4–1.2)
GFR: 67.22 mL/min (ref >60.00)
Glucose, Bld: 117 mg/dL — ABNORMAL HIGH (ref 70–99)
Potassium: 4.1 mEq/L (ref 3.5–5.1)
Sodium: 136 mEq/L (ref 135–145)
Total Bilirubin: 1 mg/dL (ref 0.2–1.2)
Total Protein: 8.5 g/dL — ABNORMAL HIGH (ref 6.0–8.3)

## 2014-04-09 LAB — MICROALBUMIN / CREATININE URINE RATIO
Creatinine,U: 112.8 mg/dL
Microalb Creat Ratio: 1.3 mg/g (ref 0.0–30.0)
Microalb, Ur: 1.5 mg/dL (ref 0.0–1.9)

## 2014-04-09 LAB — LIPID PANEL
CHOL/HDL RATIO: 4
CHOLESTEROL: 149 mg/dL (ref 0–200)
HDL: 37.4 mg/dL — ABNORMAL LOW (ref >39.00)
LDL CALC: 97 mg/dL (ref 0–99)
NonHDL: 111.6
TRIGLYCERIDES: 75 mg/dL (ref 0.0–149.0)
VLDL: 15 mg/dL (ref 0.0–40.0)

## 2014-04-09 LAB — URINALYSIS, ROUTINE W REFLEX MICROSCOPIC
Bilirubin Urine: NEGATIVE
Hgb urine dipstick: NEGATIVE
Ketones, ur: NEGATIVE
LEUKOCYTES UA: NEGATIVE
Nitrite: NEGATIVE
PH: 7 (ref 5.0–8.0)
RBC / HPF: NONE SEEN (ref 0–?)
Specific Gravity, Urine: 1.01 (ref 1.000–1.030)
Total Protein, Urine: NEGATIVE
URINE GLUCOSE: NEGATIVE
Urobilinogen, UA: 0.2 (ref 0.0–1.0)

## 2014-04-09 LAB — HEMOGLOBIN A1C: Hgb A1c MFr Bld: 7.2 % — ABNORMAL HIGH (ref 4.6–6.5)

## 2014-04-12 ENCOUNTER — Ambulatory Visit (INDEPENDENT_AMBULATORY_CARE_PROVIDER_SITE_OTHER): Payer: BC Managed Care – PPO | Admitting: Endocrinology

## 2014-04-12 ENCOUNTER — Encounter: Payer: Self-pay | Admitting: Endocrinology

## 2014-04-12 VITALS — BP 160/74 | HR 96 | Temp 98.7°F | Resp 14 | Ht 65.0 in | Wt 160.4 lb

## 2014-04-12 DIAGNOSIS — E1165 Type 2 diabetes mellitus with hyperglycemia: Secondary | ICD-10-CM

## 2014-04-12 DIAGNOSIS — E785 Hyperlipidemia, unspecified: Secondary | ICD-10-CM

## 2014-04-12 DIAGNOSIS — I10 Essential (primary) hypertension: Secondary | ICD-10-CM

## 2014-04-12 DIAGNOSIS — IMO0002 Reserved for concepts with insufficient information to code with codable children: Secondary | ICD-10-CM

## 2014-04-12 NOTE — Progress Notes (Signed)
Patient ID: Sherry Hart, female   DOB: 1961-02-05, 53 y.o.   MRN: [id number]   Reason for Appointment : Followup of Type 2 Diabetes  History of Present Illness          Diagnosis: Type 2 diabetes mellitus, date of diagnosis: 2002       Past history: She had been treated with metformin since diagnosis and previous records are not available for review. She thinks that when she was taking her blood sugar regularly there were usually below 150 in the morning In 2012 because of lack of insurance she did not take her medications or check her sugar for about a year In 6/13 she was evaluated in the emergency room for abdominal pain and was found to have an A1c of 14% with very high blood sugars. Her only symptoms were weight loss. She was given Lantus, unknown dose for about 2 months but she went off this on her own since she did not want to continue this while going to work. Also was presumably given glipizide at that time but no details available She was seen for initial consultation in 1/15 and at that time her glucose was 431 with A1c of 13.1 Initially treated with insulin but this was later replaced with oral agents  Recent history:  She started taking Kombiglyze XR on 08/08/13 and has been taking 1 tablet twice a day Because of tendency to relatively higher fasting readings she was given low-dose Amaryl in 3/15 Usually her blood sugars are slightly higher in the morning compared to the evening She has taken her Amaryl at suppertime but occasionally if she is not at home will not take it She is again noncompliant with bringing her blood sugar monitor for review She is somewhat inconsistent with her answers about what her blood sugars are at home Most likely she is only checking readings in the mornings despite instruction to check them at various times She thinks her blood sugars are not high after meals but checking these rarely Her A1c has increased about 0.5% She is concerned about her  weight but refuses to see the dietitian because of cost Trying to walk more        Oral hypoglycemic drugs the patient is taking are: Kombiglyze, Glimeperide 0.5mg  pcs Side effects from medications have been:  none  Glucose monitoring:  done 2 times a day         Glucometer: Freestyle    Blood Glucose readings from recall   PREMEAL Breakfast Lunch Dinner Bedtime Overall  Glucose range:  140-160  130  140   Mean/median:         Glycemic control:   Lab Results  Component Value Date   HGBA1C 7.2* 04/09/2014   HGBA1C 6.8* 01/05/2014   HGBA1C 8.6* 08/24/2013   Lab Results  Component Value Date   MICROALBUR 1.5 04/09/2014   LDLCALC 97 04/09/2014   CREATININE 1.1 04/09/2014   Self-care: The diet that the patient has been following is:  usually low fat. Eating cereal or oatmeal for breakfast but sometimes only toast  Usually has fruit for snacks     Meals: 3 meals per day. Avoiding drinks with sugar, dinner is at 5-7 pm           Exercise: walks more   Dietician visit: Most recent:.2002 attended a class               Compliance with the medical regimen: fair Retinal exam: Most recent: 12/13  Weight history: 125 upto 173, her lowest weight was in 11/2011  Wt Readings from Last 3 Encounters:  04/12/14 160 lb 6.4 oz (72.757 kg)  01/29/14 159 lb (72.122 kg)  01/10/14 159 lb 3.2 oz (72.213 kg)       Medication List       This list is accurate as of: 04/12/14  4:13 PM.  Always use your most recent med list.               atorvastatin 10 MG tablet  Commonly known as:  LIPITOR  Take 1 tablet (10 mg total) by mouth daily.     freestyle lancets  Use as instructed to check blood sugars 3 times per day     glimepiride 1 MG tablet  Commonly known as:  AMARYL  Take 1/2 tablet daily     glucose blood test strip  Commonly known as:  FREESTYLE LITE  Use as instructed to check blood sugars 3 times per day dx code 250.02     Insulin Aspart Prot & Aspart (70-30) 100 UNIT/ML  Pen  Commonly known as:  NOVOLOG 70/30 MIX  Inject 8-10 Units into the skin 2 (two) times daily. 8 units in am and 10 units at night     Insulin Pen Needle 32G X 4 MM Misc  Use two pen needles per day with inulin dx code 250.02     KOMBIGLYZE XR 2.10-998 MG Tb24  Generic drug:  Saxagliptin-Metformin  TAKE 2 TABLETS DAILY     losartan 100 MG tablet  Commonly known as:  COZAAR  TAKE 1 TABLET BY MOUTH DAILY     magnesium hydroxide 400 MG/5ML suspension  Commonly known as:  MILK OF MAGNESIA  Take 30 mLs by mouth daily as needed.     pantoprazole 40 MG tablet  Commonly known as:  PROTONIX  Take 1 tablet (40 mg total) by mouth daily.        Allergies:  Allergies  Allergen Reactions  . Penicillins Hives    Back of neck and upper back.    Past Medical History  Diagnosis Date  . Hypertension   . Diabetes mellitus 12 yrs ago   . Biliary colic   . Anemia   . Constipation   . Unexplained weight loss   . Abdominal pain   . Vomiting   . Weakness     Past Surgical History  Procedure Laterality Date  . Wisdom tooth extraction    . Cesarean section  11/29/1990, 04/06/1994  . Knee surgery  11/2000    right  . Mouth surgery  04/04/1987  . Colonoscopy  09/01/2004    Family History  Problem Relation Age of Onset  . Cancer Mother     breast  . Diabetes Father   . Thyroid disease Sister   . Hypertension Neg Hx     Social History:  reports that she has never smoked. She has never used smokeless tobacco. She reports that she does not drink alcohol or use illicit drugs.    Review of Systems       Lipids: She has not been on any lipid-lowering drugs in the past .  She has however started taking Lipitor that was prescribed in 12/2013 and her LDL is at target Relatively low HDL   Lab Results  Component Value Date   CHOL 149 04/09/2014   HDL 37.40* 04/09/2014   LDLCALC 97 04/09/2014   LDLDIRECT 175.6 07/28/2013   TRIG 75.0 04/09/2014  CHOLHDL 4 04/09/2014      The  blood pressure has been higher this year Her Cozaar was restarted at 50 mg and her BP was fairly good with her PCP in August Blood pressure is relatively higher today Home readings are about 409-735 systolic and 32-99 diastolic but not clear how often she is checking    No significant history of neuropathy   She was reluctant today the influenza vaccine today but discussed benefits and reassured her that it does not make people sick   Physical Examination:  BP 166/73  Pulse 96  Temp(Src) 98.7 F (37.1 C)  Resp 14  Ht 5\' 5"  (1.651 m)  Wt 160 lb 6.4 oz (72.757 kg)  BMI 26.69 kg/m2  SpO2 97%  Repeat blood pressure about the same No pedal edema  ASSESSMENT/PLAN:   Diabetes type 2   Her blood sugarsrelatively higher compared to her last visitwith taking Kombiglyze maximum dose and 0.5 mg Amaryl in the evening A1c is Now above 7% She is getting reluctant to check blood sugars as directed after meals and not clear if she is having high readings with various meals including cereal in the morning  Her weight has leveled off and she is doing some walking Most likely can benefit from increasing her Amaryl to 1 mg  However is also a candidate for changing this to [place].  She will need to bring her monitor on the next visit Given her basic meal planning information today  Hypertension: Blood pressure is still somewhat increased and is  relatively high at home also.  Will increase her Cozaar 100 mg daily She needs follow-up and she wants to come back here for repeat blood pressure in 1 month  No history of microalbuminuria  Hyperlipidemia: She  Had significantly high LDL along with low HDL.  Now significantly better Will keep her on Lipitor 10 mg  Influenza vaccine given  She will be considered for Prevnar on the next visit    Counseling time over 50% of today's 25 minute visit  Patient Instructions  Glimeperide full tab at dinner  Add protein at breakfast  daily  Please check blood sugars at least half the time about 2 hours after any meal and 3 times per week on waking up. Please bring blood sugar monitor to each visit      Tranae Laramie 04/12/2014, 4:13 PM

## 2014-04-12 NOTE — Patient Instructions (Addendum)
Glimeperide full tab at dinner  Add protein at breakfast daily  Please check blood sugars at least half the time about 2 hours after any meal and 3 times per week on waking up. Please bring blood sugar monitor to each visit

## 2014-04-17 NOTE — Progress Notes (Signed)
Quick Note:  Please let patient know that the A1c was higher at 7.2 but cholesterol okay ______

## 2014-05-08 ENCOUNTER — Other Ambulatory Visit: Payer: Self-pay | Admitting: Endocrinology

## 2014-05-16 ENCOUNTER — Ambulatory Visit (INDEPENDENT_AMBULATORY_CARE_PROVIDER_SITE_OTHER): Payer: BC Managed Care – PPO | Admitting: Endocrinology

## 2014-05-16 ENCOUNTER — Encounter: Payer: Self-pay | Admitting: Endocrinology

## 2014-05-16 VITALS — BP 138/84 | HR 93 | Temp 98.2°F | Resp 14 | Ht 65.0 in | Wt 162.6 lb

## 2014-05-16 DIAGNOSIS — I10 Essential (primary) hypertension: Secondary | ICD-10-CM

## 2014-05-16 DIAGNOSIS — E1165 Type 2 diabetes mellitus with hyperglycemia: Secondary | ICD-10-CM

## 2014-05-16 DIAGNOSIS — IMO0002 Reserved for concepts with insufficient information to code with codable children: Secondary | ICD-10-CM

## 2014-05-16 NOTE — Progress Notes (Signed)
Patient ID: Sherry Hart, female   DOB: January 31, 1961, 53 y.o.   MRN: [id number]   Reason for Appointment : Followup of blood pressure and Type 2 Diabetes  History of Present Illness          Diagnosis: Type 2 diabetes mellitus, date of diagnosis: 2002       Past history: She had been treated with metformin since diagnosis and previous records are not available for review. She thinks that when she was taking her blood sugar regularly there were usually below 150 in the morning In 2012 because of lack of insurance she did not take her medications or check her sugar for about a year In 6/13 she was evaluated in the emergency room for abdominal pain and was found to have an A1c of 14% with very high blood sugars. Her only symptoms were weight loss. She was given Lantus, unknown dose for about 2 months but she went off this on her own since she did not want to continue this while going to work. Also was presumably given glipizide at that time but no details available She was seen for initial consultation in 1/15 and at that time her glucose was 431 with A1c of 13.1 Initially treated with insulin but this was later replaced with oral agents She started taking Kombiglyze XR on 08/08/13 and has been taking 1 tablet twice a day  Recent history:  Because of tendency to relatively higher fasting readings she was given low-dose Amaryl in 3/15 in addition Usually her blood sugars are slightly higher in the morning compared to the evening Her A1c had increased slightly to 7.2 on her last visit but she did not have her monitor for review She was asked to see a dietitian and increase her Amaryl to 1 mg but she did not do so She has taken her Amaryl at suppertime but did not increase the dose to 1 mg as directed However she did have a high reading this morning but she thinks she may have forgotten her Amaryl last evening Appears not to be checking her sugars in the mornings and has only one other reading which  was quite normal She has a couple of readings after her evening meal which are fairly good She has had one episode of mild hypoglycemia at suppertime with glucose of 57; she thinks she may have been late for her evening meal. Also she has cut back on her portions and is generally not eating much at lunch, sometimes only peanut butter crackers or fruit and crackers.  At breakfast is mostly eating Cheerios and does not understand the need for having a balanced meal  Trying to walk more recently but her weight has not improved       Oral hypoglycemic drugs the patient is taking are: Kombiglyze, Glimeperide 0.5mg  pcs Side effects from medications have been:  none  Glucose monitoring:  done 1 times a day         Glucometer: Freestyle    Blood Glucose readings from    PRE-MEAL Breakfast Lunch Dinner  PCS  Overall  Glucose range:  115, 194   102   51-116   97, 107  51-194  Mean/median:     106   Glycemic control:   Lab Results  Component Value Date   HGBA1C 7.2* 04/09/2014   HGBA1C 6.8* 01/05/2014   HGBA1C 8.6* 08/24/2013   Lab Results  Component Value Date   MICROALBUR 1.5 04/09/2014   [place] 97 04/09/2014  CREATININE 1.1 04/09/2014   Self-care: The diet that the patient has been following is:  usually low fat.  Eating cereal or oatmeal for breakfast but sometimes only toast  Usually has fruit for snacks     Meals: 3 meals per day.  Avoiding drinks with sugar, dinner is at 5-7 pm           Exercise: walks regularly   Dietician visit: Most recent:.2002 attended a class               Compliance with the medical regimen: fair Retinal exam: Most recent: 12/13  Weight history: 125 upto 173, her lowest weight was in 11/2011  Wt Readings from Last 3 Encounters:  05/16/14 162 lb 9.6 oz (73.755 kg)  04/12/14 160 lb 6.4 oz (72.757 kg)  01/29/14 159 lb (72.122 kg)       Medication List       This list is accurate as of: 05/16/14  4:32 PM.  Always use your most recent med list.                atorvastatin 10 MG tablet  Commonly known as:  LIPITOR  Take 1 tablet (10 mg total) by mouth daily.     freestyle lancets  Use as instructed to check blood sugars 3 times per day     glimepiride 1 MG tablet  Commonly known as:  AMARYL  TAKE 1/2 TABLET BY MOUTH DAILY     glucose blood test strip  Commonly known as:  FREESTYLE LITE  Use as instructed to check blood sugars 3 times per day dx code 250.02     Insulin Aspart Prot & Aspart (70-30) 100 UNIT/ML Pen  Commonly known as:  NOVOLOG 70/30 MIX  Inject 8-10 Units into the skin 2 (two) times daily. 8 units in am and 10 units at night     Insulin Pen Needle 32G X 4 MM Misc  Use two pen needles per day with inulin dx code 250.02     KOMBIGLYZE XR 2.10-998 MG Tb24  Generic drug:  Saxagliptin-Metformin  TAKE 2 TABLETS DAILY     losartan 100 MG tablet  Commonly known as:  COZAAR  TAKE 1 TABLET BY MOUTH DAILY     magnesium hydroxide 400 MG/5ML suspension  Commonly known as:  MILK OF MAGNESIA  Take 30 mLs by mouth daily as needed.     pantoprazole 40 MG tablet  Commonly known as:  PROTONIX  Take 1 tablet (40 mg total) by mouth daily.        Allergies:  Allergies  Allergen Reactions  . Penicillins Hives    Back of neck and upper back.    Past Medical History  Diagnosis Date  . Hypertension   . Diabetes mellitus 12 yrs ago   . Biliary colic   . Anemia   . Constipation   . Unexplained weight loss   . Abdominal pain   . Vomiting   . Weakness     Past Surgical History  Procedure Laterality Date  . Wisdom tooth extraction    . Cesarean section  11/29/1990, 04/06/1994  . Knee surgery  11/2000    right  . Mouth surgery  04/04/1987  . Colonoscopy  09/01/2004    Family History  Problem Relation Age of Onset  . Cancer Mother     breast  . Diabetes Father   . Thyroid disease Sister   . Hypertension Neg Hx     Social History:  reports that she has never smoked. She has never used smokeless  tobacco. She reports that she does not drink alcohol or use illicit drugs.    Review of Systems       Lipids: She has not been on any lipid-lowering drugs in the past .  She has however started taking Lipitor that was prescribed in 12/2013 and her LDL is at target Relatively low HDL   Lab Results  Component Value Date   CHOL 149 04/09/2014   HDL 37.40* 04/09/2014   LDLCALC 97 04/09/2014   LDLDIRECT 175.6 07/28/2013   TRIG 75.0 04/09/2014   CHOLHDL 4 04/09/2014      The blood pressure has been higher this year Her Cozaar was increased to  100 on her last visit when systolic blood pressure was over 160 Blood pressure is better recently but probably tends to have some white coat syndrome Home readings are about 250-037  systolic and 04--88 diastolic    No significant history of neuropathy   Physical Examination:  BP 138/84 mmHg  Pulse 93  Temp(Src) 98.2 F (36.8 C)  Resp 14  Ht 5\' 5"  (1.651 m)  Wt 162 lb 9.6 oz (73.755 kg)  BMI 27.06 kg/m2  SpO2 96%  Repeat blood pressure 160/90, patient may be anxious at the end of the visit  ASSESSMENT/PLAN:   Diabetes type 2   Her blood sugars are fairly good except for high reading this morning possibly from missing her Amaryl She has another fasting readings which is fairly good at 115 without increasing her Amaryl to 1 mg and will continue the same dose of 0.5 mg at suppertime Will also consider Invokana if she continues to have some weight gain Discussed needing to have balanced meals and she is not getting much protein at breakfast and lunch.  Again refuses to see the dietitian  Hypertension: Blood pressure is looking better with 100 mg Cozaar and she will continue  She will be considered for Prevnar on the next visit   There are no Patient Instructions on file for this visit.   Treyson Axel 05/16/2014, 4:32 PM

## 2014-05-16 NOTE — Patient Instructions (Addendum)
Have protein at each meal   Please check blood sugars at least half the time about 2 hours after any meal and 2-3 times per week on waking up. Please bring blood sugar monitor to each visit

## 2014-05-28 ENCOUNTER — Other Ambulatory Visit: Payer: Self-pay | Admitting: Endocrinology

## 2014-08-06 ENCOUNTER — Encounter (INDEPENDENT_AMBULATORY_CARE_PROVIDER_SITE_OTHER): Payer: Self-pay

## 2014-08-06 ENCOUNTER — Other Ambulatory Visit: Payer: Self-pay

## 2014-08-06 ENCOUNTER — Other Ambulatory Visit: Payer: Self-pay | Admitting: Family Medicine

## 2014-08-06 ENCOUNTER — Ambulatory Visit
Admission: RE | Admit: 2014-08-06 | Discharge: 2014-08-06 | Disposition: A | Discharge status: Home / Self Care | Payer: BC Managed Care – PPO | Source: Ambulatory Visit

## 2014-08-06 DIAGNOSIS — Z803 Family history of malignant neoplasm of breast: Secondary | ICD-10-CM

## 2014-08-06 DIAGNOSIS — Z1231 Encounter for screening mammogram for malignant neoplasm of breast: Secondary | ICD-10-CM

## 2014-08-09 ENCOUNTER — Other Ambulatory Visit: Payer: Self-pay | Admitting: Endocrinology

## 2014-08-13 ENCOUNTER — Encounter: Payer: Self-pay | Admitting: Family Medicine

## 2014-08-13 ENCOUNTER — Ambulatory Visit (INDEPENDENT_AMBULATORY_CARE_PROVIDER_SITE_OTHER): Payer: BC Managed Care – PPO | Admitting: Family Medicine

## 2014-08-13 ENCOUNTER — Other Ambulatory Visit (INDEPENDENT_AMBULATORY_CARE_PROVIDER_SITE_OTHER): Payer: BC Managed Care – PPO

## 2014-08-13 VITALS — BP 132/80 | HR 84 | Temp 98.4°F | Resp 16 | Ht 65.0 in | Wt 160.0 lb

## 2014-08-13 DIAGNOSIS — Z124 Encounter for screening for malignant neoplasm of cervix: Secondary | ICD-10-CM

## 2014-08-13 DIAGNOSIS — IMO0002 Reserved for concepts with insufficient information to code with codable children: Secondary | ICD-10-CM

## 2014-08-13 DIAGNOSIS — Z01419 Encounter for gynecological examination (general) (routine) without abnormal findings: Secondary | ICD-10-CM

## 2014-08-13 DIAGNOSIS — Z23 Encounter for immunization: Secondary | ICD-10-CM

## 2014-08-13 DIAGNOSIS — E1165 Type 2 diabetes mellitus with hyperglycemia: Secondary | ICD-10-CM

## 2014-08-13 LAB — COMPREHENSIVE METABOLIC PANEL
ALT: 15 U/L (ref 0–35)
AST: 20 U/L (ref 0–37)
Albumin: 4.3 g/dL (ref 3.5–5.2)
Alkaline Phosphatase: 69 U/L (ref 39–117)
BILIRUBIN TOTAL: 0.7 mg/dL (ref 0.2–1.2)
BUN: 14 mg/dL (ref 6–23)
CALCIUM: 9.8 mg/dL (ref 8.4–10.5)
CHLORIDE: 105 meq/L (ref 96–112)
CO2: 28 meq/L (ref 19–32)
Creatinine, Ser: 0.99 mg/dL (ref 0.40–1.20)
GFR: 75.02 mL/min (ref >60.00)
Glucose, Bld: 105 mg/dL — ABNORMAL HIGH (ref 70–99)
Potassium: 4.3 mEq/L (ref 3.5–5.1)
Sodium: 140 mEq/L (ref 135–145)
Total Protein: 8.3 g/dL (ref 6.0–8.3)

## 2014-08-13 LAB — HEMOGLOBIN A1C: Hgb A1c MFr Bld: 7 % — ABNORMAL HIGH (ref 4.6–6.5)

## 2014-08-13 NOTE — Progress Notes (Signed)
Patient ID: Sherry Hart, female   DOB: Dec 09, 1960, 54 y.o.   MRN: [id number]   Subjective:    Patient ID: Sherry Hart, female    DOB: Jul 23, 1960, 54 y.o.   MRN: [id number]  Patient presents for PAP only  patient here for GYN examination. She has no specific concerns today. 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 She was not positive for 16 or 18. She has no history of abnormal Pap otherwise. Her mammogram is up-to-date. She is due for colonoscopy but she wants to hold off until the summer to have this done. She is due for tetanus booster. She states that she has had a pneumonia vaccine secondary to her diabetes. Mammogram UTD   Review Of Systems:  GEN- denies fatigue, fever, weight loss,weakness, recent illness HEENT- denies eye drainage, change in vision, nasal discharge, CVS- denies chest pain, palpitations RESP- denies SOB, cough, wheeze ABD- denies N/V, change in stools, abd pain GU- denies dysuria, hematuria, dribbling, incontinence MSK- denies joint pain, muscle aches, injury Neuro- denies headache, dizziness, syncope, seizure activity       Objective:    BP 132/80 mmHg  Pulse 84  Temp(Src) 98.4 F (36.9 C) (Oral)  Resp 16  Ht 5\' 5"  (1.651 m)  Wt 160 lb (72.576 kg)  BMI 26.63 kg/m2 GEN- NAD, alert and oriented x3 Neck- supple, no thyromegaly Breast- normal symmetry, no nipple inversion,no nipple drainage, no nodules or lumps felt Nodes- no axillary nodes CVS- RRR, no murmur RESP-CTAB ABD-NABS,soft,NT,ND GU- normal external genitalia, vaginal mucosa pink and moist, cervix visualized no growth, no blood form os, no discharge, no CMT, no ovarian masses, uterus normal size Rectum- no external lesions normal tone ,FOBT neg, soft stool in vault Ext- no edema       Assessment & Plan:      Problem List Items Addressed This Visit    None    Visit Diagnoses    Pap smear for cervical cancer screening    -  Primary    Relevant Orders    Pap IG and HPV (high risk) DNA detection    Encounter for routine gynecological examination        PAP Smear repeated, Mammogram UTD, TDAP given, will decide on f/u pending results of PAP Smear, Colonoscopy in Summer 2016       Note: This dictation was prepared with Dragon dictation along with smaller phrase technology. Any transcriptional errors that result from this process are unintentional.

## 2014-08-13 NOTE — Addendum Note (Signed)
Addended by: Sheral Flow on: 08/13/2014 06:44 PM   Modules accepted: Orders

## 2014-08-13 NOTE — Patient Instructions (Signed)
We will call with lab results  Continue current medications Tetanus Booster given F/U as needed

## 2014-08-15 ENCOUNTER — Encounter: Payer: Self-pay | Admitting: Endocrinology

## 2014-08-15 ENCOUNTER — Ambulatory Visit (INDEPENDENT_AMBULATORY_CARE_PROVIDER_SITE_OTHER): Payer: BC Managed Care – PPO | Admitting: Endocrinology

## 2014-08-15 VITALS — BP 152/82 | HR 91 | Temp 97.9°F | Resp 14 | Ht 65.0 in | Wt 160.6 lb

## 2014-08-15 DIAGNOSIS — E1140 Type 2 diabetes mellitus with diabetic neuropathy, unspecified: Secondary | ICD-10-CM

## 2014-08-15 DIAGNOSIS — I10 Essential (primary) hypertension: Secondary | ICD-10-CM

## 2014-08-15 DIAGNOSIS — E1165 Type 2 diabetes mellitus with hyperglycemia: Secondary | ICD-10-CM

## 2014-08-15 DIAGNOSIS — IMO0002 Reserved for concepts with insufficient information to code with codable children: Secondary | ICD-10-CM

## 2014-08-15 LAB — PAP IG AND HPV HIGH-RISK: HPV DNA HIGH RISK: NOT DETECTED

## 2014-08-15 NOTE — Progress Notes (Signed)
Patient ID: Sherry Hart, female   DOB: 1961-03-15, 54 y.o.   MRN: [id number]   Reason for Appointment : Followup    History of Present Illness          Problem 1:: Type 2 diabetes mellitus, date of diagnosis: 2002       Past history: She had been treated with metformin since diagnosis and previous records are not available for review. She thinks that when she was taking her blood sugar regularly there were usually below 150 in the morning In 2012 because of lack of insurance she did not take her medications or check her sugar for about a year In 6/13 she was evaluated in the emergency room for abdominal pain and was found to have an A1c of 14% with very high blood sugars. Her only symptoms were weight loss. She was given Lantus, unknown dose for about 2 months but she went off this on her own since she did not want to continue this while going to work. Also was presumably given glipizide at that time but no details available She was seen for initial consultation in 1/15 and at that time her glucose was 431 with A1c of 13.1 Initially treated with insulin but this was later replaced with oral agents She started taking Kombiglyze XR on 08/08/13 and has been taking 1 tablet twice a day Because of tendency to relatively higher fasting readings she was given low-dose Amaryl in 3/15 in addition  Recent history:  Usually her blood sugars are slightly higher in the morning compared to the evening However she has not checked her sugars much at all and not clear what her readings are Her A1c had increased slightly to 7.2 on her last visit but now it is back to 7% Her blood sugars appear to be usually lower than expected for A1c, average glucose on her previous visit was 106 She has taken her Amaryl at suppertime and only taking 0.5 mg at this time Previously has had occasional hypoglycemia before supper but not recently Although she is trying to do some walking in the afternoons on some days she has  not been able to lose weight She has cut back on her portions and is generally avoiding high carbohydrate meals At breakfast is mostly eating Cheerios or raisin bran, some days may eggs also since her last visit       Oral hypoglycemic drugs the patient is taking are: Kombiglyze, Glimeperide 0.5mg  pcs Side effects from medications have been:  none  Glucose monitoring:  done infrequently        Glucometer: Freestyle    Blood Glucose readings from recall am upto 147; acs 120, hs 155   Glycemic control:   Lab Results  Component Value Date   HGBA1C 7.0* 08/13/2014   HGBA1C 7.2* 04/09/2014   HGBA1C 6.8* 01/05/2014   Lab Results  Component Value Date   MICROALBUR 1.5 04/09/2014   LDLCALC 97 04/09/2014   CREATININE 0.99 08/13/2014   Self-care: The diet that the patient has been following is:  usually low fat, low carbs.  Eating egg in am sometimes cereal Usually has fruit for snacks     Meals: 3 meals per day.  Avoiding drinks with sugar, dinner is at 5-7 pm           Exercise: walks periodically    Dietician visit: Most recent:.2002 attended a class               Compliance with the  medical regimen: fair Retinal exam: Most recent: 12/13  Weight history: 125 upto 173, her lowest weight was in 11/2011  Wt Readings from Last 3 Encounters:  08/15/14 160 lb 9.6 oz (72.848 kg)  08/13/14 160 lb (72.576 kg)  05/16/14 162 lb 9.6 oz (73.755 kg)       Medication List       This list is accurate as of: 08/15/14 11:59 PM.  Always use your most recent med list.               atorvastatin 10 MG tablet  Commonly known as:  LIPITOR  TAKE 1 TABLET BY MOUTH DAILY     freestyle lancets  Use as instructed to check blood sugars 3 times per day     glimepiride 1 MG tablet  Commonly known as:  AMARYL  TAKE 1/2 TABLET BY MOUTH DAILY     glucose blood test strip  Commonly known as:  FREESTYLE LITE  Use as instructed to check blood sugars 3 times per day dx code 250.02     KOMBIGLYZE  XR 2.10-998 MG Tb24  Generic drug:  Saxagliptin-Metformin  TAKE 2 TABLETS DAILY     losartan 100 MG tablet  Commonly known as:  COZAAR  TAKE 1 TABLET BY MOUTH DAILY        Allergies:  Allergies  Allergen Reactions  . Penicillins Hives    Back of neck and upper back.    Past Medical History  Diagnosis Date  . Hypertension   . Diabetes mellitus 12 yrs ago   . Biliary colic   . Anemia   . Constipation   . Unexplained weight loss   . Abdominal pain   . Vomiting   . Weakness     Past Surgical History  Procedure Laterality Date  . Wisdom tooth extraction    . Cesarean section  11/29/1990, 04/06/1994  . Knee surgery  11/2000    right  . Mouth surgery  04/04/1987  . Colonoscopy  09/01/2004    Family History  Problem Relation Age of Onset  . Cancer Mother     breast  . Diabetes Father   . Thyroid disease Sister   . Hypertension Neg Hx     Social History:  reports that she has never smoked. She has never used smokeless tobacco. She reports that she does not drink alcohol or use illicit drugs.    Review of Systems   She is not complaining about a sensation of tingling and some numbness in her feet in the toes and some discomfort. This does not bother her at night as much.  She says that she is trying to use diabetic socks and may feel a little better with this.  No pain in her calf muscles on walking      Lipids: She has not been on any lipid-lowering drugs in the past .  She has  started taking Lipitor that was prescribed in 12/2013 and her LDL is at target Relatively low HDL persists   Lab Results  Component Value Date   CHOL 149 04/09/2014   HDL 37.40* 04/09/2014   LDLCALC 97 04/09/2014   LDLDIRECT 175.6 07/28/2013   TRIG 75.0 04/09/2014   CHOLHDL 4 04/09/2014      The blood pressure has been treated with Cozaar, this was increased to 100 mg on her previous visit Although blood pressure is consistently high here in the office visit was significantly  better at about 742 systolic with her  PCP recently Home readings are about 979-480  systolic and 16--55 diastolic   Diabetic foot exam done in 08/2014   Physical Examination:  BP 152/82 mmHg  Pulse 91  Temp(Src) 97.9 F (36.6 C)  Resp 14  Ht 5\' 5"  (1.651 m)  Wt 160 lb 9.6 oz (72.848 kg)  BMI 26.73 kg/m2  SpO2 97%  Repeat blood pressure 158/84  Foot exam normal except for Decreased distal monofilament sensation on the toes and plantar surfaces  ASSESSMENT/PLAN:   Diabetes type 2   Her blood sugars are fairly good but she has not checked any readings at home recently and does not remember all her readings Although her readings at home tend to be higher fasting she had a good reading in the lab Does not think she has any high postprandial readings. Discussed options for various devices to check her blood sugar with that she wants to continue the same  Also she will try to do better with balancing her meals especially breakfast with more protein Currently trying to exercise with walking in the afternoon and encouraged her to do that more often  Discussed blood sugar targets, timing of glucose monitoring and to call if blood sugars are consistently high  Will also consider Invokana if she continues to have worsening readings especially with weight gain  Hypertension: Blood pressure is high in the office again but better with PCP and she will continue the same dose for now  NEUROPATHY: She does appear to have symptoms of neuropathy with mild decreased sensation in the toes Discussed need for continued improvement in blood sugar control She does not want any symptomatic treatment at this time  She will be considered for Prevnar on the next visit   Patient Instructions  Please check blood sugars at least half the time about 2 hours after any meal and 3 times per week on waking up. Please bring blood sugar monitor to each visit. Recommended blood sugar levels about 2 hours after  meal is 140-180 and on waking up 90-130  Check BP weekly at home    Counseling time over 50% of today's 25 minute visit  Belvia Gotschall 08/16/2014, 12:10 PM

## 2014-08-15 NOTE — Patient Instructions (Addendum)
Please check blood sugars at least half the time about 2 hours after any meal and 3 times per week on waking up. Please bring blood sugar monitor to each visit. Recommended blood sugar levels about 2 hours after meal is 140-180 and on waking up 90-130  Check BP weekly at home

## 2014-08-24 ENCOUNTER — Ambulatory Visit (INDEPENDENT_AMBULATORY_CARE_PROVIDER_SITE_OTHER): Payer: BC Managed Care – PPO | Admitting: Family Medicine

## 2014-08-24 ENCOUNTER — Encounter: Payer: Self-pay | Admitting: Family Medicine

## 2014-08-24 VITALS — BP 118/70 | HR 82 | Temp 98.0°F | Resp 18 | Wt 160.0 lb

## 2014-08-24 DIAGNOSIS — G629 Polyneuropathy, unspecified: Secondary | ICD-10-CM | POA: Diagnosis not present

## 2014-08-24 NOTE — Progress Notes (Signed)
Subjective:    Patient ID: Sherry Hart, female    DOB: [birth year], 54 y.o.   MRN: [id number]  HPI Patient has a long-standing history of type 2 diabetes mellitus which is been uncontrolled. She also has a remote history of vitamin B12 deficiency. Over the last several months she started to notice numbness in both feet. It is located primarily on the plantar aspects near the MTP joints however the numbness sometimes extends up both shins. She also complains of some tingling in both feet. She denies any low back pain or symptoms of sciatica. She denies any bowel or bladder incontinence. She also reports tingling in the third and fourth digits on both hands.  Tinel sign and Phalen sign are negative today on examination. She denies any neck pain or symptoms of cervical radiculopathy. She denies any weakness in her hands or feet. She denies any muscle weakness in general. Past Medical History  Diagnosis Date  . Hypertension   . Diabetes mellitus 12 yrs ago   . Biliary colic   . Anemia   . Constipation   . Unexplained weight loss   . Abdominal pain   . Vomiting   . Weakness    Past Surgical History  Procedure Laterality Date  . Wisdom tooth extraction    . Cesarean section  11/29/1990, 04/06/1994  . Knee surgery  11/2000    right  . Mouth surgery  04/04/1987  . Colonoscopy  09/01/2004   Current Outpatient Prescriptions on File Prior to Visit  Medication Sig Dispense Refill  . atorvastatin (LIPITOR) 10 MG tablet TAKE 1 TABLET BY MOUTH DAILY 30 tablet 3  . glimepiride (AMARYL) 1 MG tablet TAKE 1/2 TABLET BY MOUTH DAILY 15 tablet 3  . glucose blood (FREESTYLE LITE) test strip Use as instructed to check blood sugars 3 times per day dx code 250.02 100 each 5  . KOMBIGLYZE XR 2.10-998 MG TB24 TAKE 2 TABLETS DAILY 60 tablet 3  . Lancets (FREESTYLE) lancets Use as instructed to check blood sugars 3 times per day 100 each 5  . losartan (COZAAR) 100 MG tablet TAKE 1 TABLET BY MOUTH DAILY 30  tablet 3   No current facility-administered medications on file prior to visit.   Allergies  Allergen Reactions  . Penicillins Hives    Back of neck and upper back.   History   Social History  . Marital Status: Married    Spouse Name: N/A  . Number of Children: N/A  . Years of Education: N/A   Occupational History  . Not on file.   Social History Main Topics  . Smoking status: Never Smoker   . Smokeless tobacco: Never Used  . Alcohol Use: No  . Drug Use: No  . Sexual Activity: Not on file   Other Topics Concern  . Not on file   Social History Narrative      Review of Systems  All other systems reviewed and are negative.      Objective:   Physical Exam  Constitutional: She is oriented to person, place, and time.  Cardiovascular: Normal rate, regular rhythm, normal heart sounds and intact distal pulses.   Pulmonary/Chest: Effort normal and breath sounds normal.  Musculoskeletal: Normal range of motion. She exhibits no edema or tenderness.  Neurological: She is alert and oriented to person, place, and time. She has normal reflexes. She displays normal reflexes. No cranial nerve deficit. She exhibits normal muscle tone. Coordination normal.  Vitals reviewed.  Assessment & Plan:  Peripheral neuropathy - Plan: COMPLETE METABOLIC PANEL WITH GFR, CBC with Differential/Platelet, TSH, Hemoglobin A1c, Vitamin B12  I will check a hemoglobin A1c, TSH, and a vitamin B12 to evaluate for metabolic causes of peripheral neuropathy. I suspect the patient has long-standing diabetic neuropathy. If her TSH and vitamin B12 are normal in her hemoglobin A1c is controlled, I would recommend nerve conduction studies to rule out other possible causes of neuropathy.

## 2014-08-25 LAB — TSH: TSH: 2.654 u[IU]/mL (ref 0.350–4.500)

## 2014-08-25 LAB — COMPLETE METABOLIC PANEL WITH GFR
ALT: 11 U/L (ref 0–35)
AST: 16 U/L (ref 0–37)
Albumin: 4.2 g/dL (ref 3.5–5.2)
Alkaline Phosphatase: 64 U/L (ref 39–117)
BUN: 15 mg/dL (ref 6–23)
CALCIUM: 9.3 mg/dL (ref 8.4–10.5)
CHLORIDE: 104 meq/L (ref 96–112)
CO2: 28 mEq/L (ref 19–32)
CREATININE: 0.93 mg/dL (ref 0.50–1.10)
GFR, EST NON AFRICAN AMERICAN: 70 mL/min
GFR, Est African American: 81 mL/min
GLUCOSE: 88 mg/dL (ref 70–99)
POTASSIUM: 4.2 meq/L (ref 3.5–5.3)
Sodium: 139 mEq/L (ref 135–145)
TOTAL PROTEIN: 7.7 g/dL (ref 6.0–8.3)
Total Bilirubin: 0.5 mg/dL (ref 0.2–1.2)

## 2014-08-25 LAB — CBC WITH DIFFERENTIAL/PLATELET
BASOS ABS: 0 10*3/uL (ref 0.0–0.1)
Basophils Relative: 0 % (ref 0–1)
EOS ABS: 0.1 10*3/uL (ref 0.0–0.7)
Eosinophils Relative: 2 % (ref 0–5)
HCT: 28.7 % — ABNORMAL LOW (ref 36.0–46.0)
HEMOGLOBIN: 9.4 g/dL — AB (ref 12.0–15.0)
LYMPHS ABS: 2.1 10*3/uL (ref 0.7–4.0)
LYMPHS PCT: 34 % (ref 12–46)
MCH: 34.2 pg — ABNORMAL HIGH (ref 26.0–34.0)
MCHC: 32.8 g/dL (ref 30.0–36.0)
MCV: 104.4 fL — AB (ref 78.0–100.0)
MONO ABS: 0.4 10*3/uL (ref 0.1–1.0)
MONOS PCT: 6 % (ref 3–12)
MPV: 10.6 fL (ref 8.6–12.4)
Neutro Abs: 3.5 10*3/uL (ref 1.7–7.7)
Neutrophils Relative %: 58 % (ref 43–77)
PLATELETS: 286 10*3/uL (ref 150–400)
RBC: 2.75 MIL/uL — AB (ref 3.87–5.11)
RDW: 19.7 % — ABNORMAL HIGH (ref 11.5–15.5)
WBC: 6.1 10*3/uL (ref 4.0–10.5)

## 2014-08-25 LAB — HEMOGLOBIN A1C
Hgb A1c MFr Bld: 6.9 % — ABNORMAL HIGH (ref <5.7)
Mean Plasma Glucose: 151 mg/dL — ABNORMAL HIGH (ref <117)

## 2014-08-25 LAB — VITAMIN B12: Vitamin B-12: 171 pg/mL — ABNORMAL LOW (ref 211–911)

## 2014-08-27 ENCOUNTER — Ambulatory Visit: Payer: BC Managed Care – PPO | Admitting: Family Medicine

## 2014-09-03 ENCOUNTER — Other Ambulatory Visit: Payer: Self-pay | Admitting: Family Medicine

## 2014-09-03 DIAGNOSIS — D539 Nutritional anemia, unspecified: Secondary | ICD-10-CM

## 2014-09-05 ENCOUNTER — Other Ambulatory Visit: Payer: BC Managed Care – PPO

## 2014-09-05 DIAGNOSIS — D539 Nutritional anemia, unspecified: Secondary | ICD-10-CM

## 2014-09-06 LAB — FECAL OCCULT BLOOD, IMMUNOCHEMICAL
FECAL OCCULT BLOOD: NEGATIVE
FECAL OCCULT BLOOD: NEGATIVE
Fecal Occult Blood: NEGATIVE

## 2014-09-11 ENCOUNTER — Other Ambulatory Visit: Payer: Self-pay | Admitting: Endocrinology

## 2014-09-13 ENCOUNTER — Telehealth: Payer: Self-pay | Admitting: Endocrinology

## 2014-09-13 NOTE — Telephone Encounter (Signed)
Pt calling to speak with someone about diabetic medication ad that she saw on the tv from a lawsuit. She feels uneasy about the meds she is on.

## 2014-09-26 ENCOUNTER — Other Ambulatory Visit: Payer: Self-pay

## 2014-09-26 MED ORDER — ATORVASTATIN CALCIUM 10 MG PO TABS: 10.0000 mg | ORAL_TABLET | Freq: Every day | ORAL | Status: DC

## 2014-10-05 ENCOUNTER — Encounter: Payer: Self-pay | Admitting: Family Medicine

## 2014-10-05 ENCOUNTER — Ambulatory Visit (INDEPENDENT_AMBULATORY_CARE_PROVIDER_SITE_OTHER): Payer: BC Managed Care – PPO | Admitting: Family Medicine

## 2014-10-05 VITALS — BP 160/94 | HR 98 | Temp 98.2°F | Resp 16 | Wt 150.0 lb

## 2014-10-05 DIAGNOSIS — G629 Polyneuropathy, unspecified: Secondary | ICD-10-CM

## 2014-10-05 DIAGNOSIS — E538 Deficiency of other specified B group vitamins: Secondary | ICD-10-CM | POA: Diagnosis not present

## 2014-10-05 DIAGNOSIS — E1165 Type 2 diabetes mellitus with hyperglycemia: Secondary | ICD-10-CM | POA: Diagnosis not present

## 2014-10-05 DIAGNOSIS — I10 Essential (primary) hypertension: Secondary | ICD-10-CM | POA: Diagnosis not present

## 2014-10-05 DIAGNOSIS — IMO0002 Reserved for concepts with insufficient information to code with codable children: Secondary | ICD-10-CM

## 2014-10-05 MED ORDER — CYANOCOBALAMIN 1000 MCG/ML IJ SOLN
1000.0000 ug | Freq: Once | INTRAMUSCULAR | Status: AC
  Administered 2014-10-05: 1000 ug via INTRAMUSCULAR

## 2014-10-05 NOTE — Progress Notes (Signed)
Subjective:    Patient ID: Sherry Hart, female    DOB: 09/17/1960, 54 y.o.   MRN: [id number]  HPI 08/24/14 Patient has a long-standing history of type 2 diabetes mellitus which is been uncontrolled. She also has a remote history of vitamin B12 deficiency. Over the last several months she started to notice numbness in both feet. It is located primarily on the plantar aspects near the MTP joints however the numbness sometimes extends up both shins. She also complains of some tingling in both feet. She denies any low back pain or symptoms of sciatica. She denies any bowel or bladder incontinence. She also reports tingling in the third and fourth digits on both hands.  Tinel sign and Phalen sign are negative today on examination. She denies any neck pain or symptoms of cervical radiculopathy. She denies any weakness in her hands or feet. She denies any muscle weakness in general.  At that time, my plan was:  I will check a hemoglobin A1c, TSH, and a vitamin B12 to evaluate for metabolic causes of peripheral neuropathy. I suspect the patient has long-standing diabetic neuropathy. If her TSH and vitamin B12 are normal in her hemoglobin A1c is controlled, I would recommend nerve conduction studies to rule out other possible causes of neuropathy.  10/05/14 Lab work revealed anemia, low iron, and B12 deficiency. Stool cards revealed no occult GI blood loss.  Patient was instructed to start taking iron and come in for vitamin B12 injections.  Please see the lab results from her last office visit and the notes attached to those lab results.  Patient misunderstood. She has been taking B12 orally.  She continues to complain of neuropathy in the feet. She continues to complain of numbness and tingling. She is also having some numbness and tingling in the third and fourth fingers on her right hand. Recently she saw a advertisement warning about side effects from [place] XR. Therefore the patient discontinued her  Kombiglyze XR and her Amaryl independently. She may also be off of her blood pressure medicine although she is unsure. Her blood sugars are now over 300. Her blood pressure is elevated. I spent approximately 20 minutes explaining the risk of uncontrolled diabetes and the low probability of side effects from her diabetes medication particularly the ones advertisement on TV by lawyers.   Past Medical History  Diagnosis Date  . Hypertension   . Diabetes mellitus 12 yrs ago   . Biliary colic   . Anemia   . Constipation   . Unexplained weight loss   . Abdominal pain   . Vomiting   . Weakness    Past Surgical History  Procedure Laterality Date  . Wisdom tooth extraction    . Cesarean section  11/29/1990, 04/06/1994  . Knee surgery  11/2000    right  . Mouth surgery  04/04/1987  . Colonoscopy  09/01/2004   Current Outpatient Prescriptions on File Prior to Visit  Medication Sig Dispense Refill  . atorvastatin (LIPITOR) 10 MG tablet Take 1 tablet (10 mg total) by mouth daily. 30 tablet 3  . glucose blood (FREESTYLE LITE) test strip Use as instructed to check blood sugars 3 times per day dx code 250.02 100 each 5  . Lancets (FREESTYLE) lancets Use as instructed to check blood sugars 3 times per day 100 each 5  . losartan (COZAAR) 100 MG tablet TAKE 1 TABLET BY MOUTH DAILY 30 tablet 3  . glimepiride (AMARYL) 1 MG tablet TAKE 1/2 TABLET BY  MOUTH DAILY (Patient not taking: Reported on 10/05/2014) 15 tablet 3  . KOMBIGLYZE XR 2.10-998 MG TB24 TAKE 2 TABLETS DAILY (Patient not taking: Reported on 10/05/2014) 60 tablet 3   No current facility-administered medications on file prior to visit.   Allergies  Allergen Reactions  . Penicillins Hives    Back of neck and upper back.   History   Social History  . Marital Status: Married    Spouse Name: N/A  . Number of Children: N/A  . Years of Education: N/A   Occupational History  . Not on file.   Social History Main Topics  . Smoking status:  Never Smoker   . Smokeless tobacco: Never Used  . Alcohol Use: No  . Drug Use: No  . Sexual Activity: Not on file   Other Topics Concern  . Not on file   Social History Narrative      Review of Systems  All other systems reviewed and are negative.      Objective:   Physical Exam  Constitutional: She is oriented to person, place, and time.  Cardiovascular: Normal rate, regular rhythm, normal heart sounds and intact distal pulses.   Pulmonary/Chest: Effort normal and breath sounds normal.  Musculoskeletal: Normal range of motion. She exhibits no edema or tenderness.  Neurological: She is alert and oriented to person, place, and time. She has normal reflexes. No cranial nerve deficit. She exhibits normal muscle tone. Coordination normal.  Vitals reviewed.         Assessment & Plan:  B12 deficiency - Plan: cyanocobalamin ((VITAMIN B-12)) injection 1,000 mcg  Peripheral neuropathy  Essential hypertension  Diabetes mellitus type II, uncontrolled  Patient agrees to resume her diabetes medication and her blood pressure medication immediately. She will call me with her blood pressures next week. She agrees to take iron. She is going to start B12 injections. She received her first B12 injection today. I would like her to return everyday for the next week, then once a week for the next month, and then once a month thereafter. Repeat a CBC in one month. She is going to discuss switching diabetes medications with her endocrinologist.

## 2014-10-08 ENCOUNTER — Ambulatory Visit: Payer: BC Managed Care – PPO | Admitting: Family Medicine

## 2014-10-08 ENCOUNTER — Ambulatory Visit (INDEPENDENT_AMBULATORY_CARE_PROVIDER_SITE_OTHER): Payer: BC Managed Care – PPO | Admitting: *Deleted

## 2014-10-08 DIAGNOSIS — D509 Iron deficiency anemia, unspecified: Secondary | ICD-10-CM

## 2014-10-08 MED ORDER — CYANOCOBALAMIN 1000 MCG/ML IJ SOLN
1000.0000 ug | Freq: Once | INTRAMUSCULAR | Status: AC
  Administered 2014-10-08: 1000 ug via INTRAMUSCULAR

## 2014-10-09 ENCOUNTER — Ambulatory Visit (INDEPENDENT_AMBULATORY_CARE_PROVIDER_SITE_OTHER): Payer: BC Managed Care – PPO | Admitting: Family Medicine

## 2014-10-09 DIAGNOSIS — D539 Nutritional anemia, unspecified: Secondary | ICD-10-CM

## 2014-10-09 DIAGNOSIS — E538 Deficiency of other specified B group vitamins: Secondary | ICD-10-CM

## 2014-10-09 MED ORDER — CYANOCOBALAMIN 1000 MCG/ML IJ SOLN
1000.0000 ug | Freq: Once | INTRAMUSCULAR | Status: AC
  Administered 2014-10-09: 1000 ug via INTRAMUSCULAR

## 2014-10-10 ENCOUNTER — Ambulatory Visit (INDEPENDENT_AMBULATORY_CARE_PROVIDER_SITE_OTHER): Payer: BC Managed Care – PPO | Admitting: Family Medicine

## 2014-10-10 DIAGNOSIS — E538 Deficiency of other specified B group vitamins: Secondary | ICD-10-CM

## 2014-10-10 MED ORDER — CYANOCOBALAMIN 1000 MCG/ML IJ SOLN
1000.0000 ug | Freq: Once | INTRAMUSCULAR | Status: AC
  Administered 2014-10-10: 1000 ug via INTRAMUSCULAR

## 2014-10-11 ENCOUNTER — Ambulatory Visit (INDEPENDENT_AMBULATORY_CARE_PROVIDER_SITE_OTHER): Payer: BC Managed Care – PPO | Admitting: *Deleted

## 2014-10-11 DIAGNOSIS — E538 Deficiency of other specified B group vitamins: Secondary | ICD-10-CM

## 2014-10-11 MED ORDER — CYANOCOBALAMIN 1000 MCG/ML IJ SOLN
1000.0000 ug | Freq: Once | INTRAMUSCULAR | Status: AC
  Administered 2014-10-11: 1000 ug via INTRAMUSCULAR

## 2014-10-11 NOTE — Progress Notes (Signed)
Patient ID: Sherry Hart, female   DOB: 11-27-1960, 54 y.o.   MRN: [id number] Patient seen in office today for day 5 of daily Vitamin B12 injections x1 week.   Tolerated IM administration well.

## 2014-10-12 ENCOUNTER — Ambulatory Visit (INDEPENDENT_AMBULATORY_CARE_PROVIDER_SITE_OTHER): Payer: BC Managed Care – PPO | Admitting: *Deleted

## 2014-10-12 DIAGNOSIS — D510 Vitamin B12 deficiency anemia due to intrinsic factor deficiency: Secondary | ICD-10-CM

## 2014-10-12 MED ORDER — CYANOCOBALAMIN 1000 MCG/ML IJ SOLN
1000.0000 ug | Freq: Once | INTRAMUSCULAR | Status: AC
  Administered 2014-10-12: 1000 ug via INTRAMUSCULAR

## 2014-10-15 ENCOUNTER — Ambulatory Visit (INDEPENDENT_AMBULATORY_CARE_PROVIDER_SITE_OTHER): Payer: BC Managed Care – PPO | Admitting: *Deleted

## 2014-10-15 DIAGNOSIS — D519 Vitamin B12 deficiency anemia, unspecified: Secondary | ICD-10-CM

## 2014-10-15 LAB — HM DIABETES EYE EXAM

## 2014-10-15 MED ORDER — CYANOCOBALAMIN 1000 MCG/ML IJ SOLN
1000.0000 ug | Freq: Once | INTRAMUSCULAR | Status: AC
  Administered 2014-10-15: 1000 ug via INTRAMUSCULAR

## 2014-10-16 ENCOUNTER — Telehealth: Payer: Self-pay | Admitting: *Deleted

## 2014-10-16 ENCOUNTER — Ambulatory Visit (INDEPENDENT_AMBULATORY_CARE_PROVIDER_SITE_OTHER): Payer: BC Managed Care – PPO | Admitting: *Deleted

## 2014-10-16 DIAGNOSIS — E538 Deficiency of other specified B group vitamins: Secondary | ICD-10-CM

## 2014-10-16 MED ORDER — CYANOCOBALAMIN 1000 MCG/ML IJ SOLN
1000.0000 ug | Freq: Once | INTRAMUSCULAR | Status: AC
  Administered 2014-10-16: 1000 ug via INTRAMUSCULAR

## 2014-10-16 NOTE — Telephone Encounter (Signed)
Pt came in today to her B12 injection and handed me readings of her BP   10/06/14-8:44am 135/78 10/07/14-7:41am  158/87 10/08/14-4:48pm 123/79 10/09/14-6:47am 134/77 10/10/14-5:08pm 119/77 10/11/14-6:52am 143/85 10/12/14-6:49am 155/82 7:39am 123/78 10/13/14-9:05am 142/72 3:29pm 126/81 10/14/14-6:30am 140/81;9:24pm 121/77 10/15/14-6:50am 142/83  10/16/14 6:30am 123/87

## 2014-10-17 ENCOUNTER — Encounter: Payer: Self-pay | Admitting: *Deleted

## 2014-10-17 ENCOUNTER — Ambulatory Visit (INDEPENDENT_AMBULATORY_CARE_PROVIDER_SITE_OTHER): Payer: BC Managed Care – PPO | Admitting: Endocrinology

## 2014-10-17 ENCOUNTER — Encounter: Payer: Self-pay | Admitting: Endocrinology

## 2014-10-17 VITALS — BP 170/88 | HR 96 | Temp 98.2°F | Resp 16 | Ht 65.0 in | Wt 151.8 lb

## 2014-10-17 DIAGNOSIS — E1165 Type 2 diabetes mellitus with hyperglycemia: Secondary | ICD-10-CM

## 2014-10-17 DIAGNOSIS — I10 Essential (primary) hypertension: Secondary | ICD-10-CM | POA: Diagnosis not present

## 2014-10-17 DIAGNOSIS — IMO0002 Reserved for concepts with insufficient information to code with codable children: Secondary | ICD-10-CM

## 2014-10-17 MED ORDER — CANAGLIFLOZIN 100 MG PO TABS: 100.0000 mg | ORAL_TABLET | Freq: Every day | ORAL | Status: DC

## 2014-10-17 MED ORDER — SITAGLIP PHOS-METFORMIN HCL ER 100-1000 MG PO TB24: ORAL_TABLET | ORAL | Status: DC

## 2014-10-17 NOTE — Progress Notes (Signed)
Patient ID: Sherry Hart, female   DOB: 08-10-1960, 54 y.o.   MRN: [id number]   Reason for Appointment : Followup    History of Present Illness          Problem 1:: Type 2 diabetes mellitus, date of diagnosis: 2002       Past history: She had been treated with metformin since diagnosis and previous records are not available for review. She thinks that when she was taking her blood sugar regularly there were usually below 150 in the morning In 2012 because of lack of insurance she did not take her medications or check her sugar for about a year In 6/13 she was evaluated in the emergency room for abdominal pain and was found to have an A1c of 14% with very high blood sugars. Her only symptoms were weight loss. She was given Lantus, unknown dose for about 2 months but she went off this on her own since she did not want to continue this while going to work. Also was presumably given glipizide at that time but no details available She was seen for initial consultation in 1/15 and at that time her glucose was 431 with A1c of 13.1 Initially treated with insulin but this was later replaced with oral agents She started taking Kombiglyze XR on 08/08/13 and had gone up to 2 tablets daily Because of tendency to relatively higher fasting readings she was given low-dose Amaryl in 3/15 in addition  Recent history:  She is here for an interim visit because of problems with hyperglycemia Even though she has been on Kombiglyze XR  for over a year she stopped taking this about 5 weeks ago because of fear of side effects and television commercial mentioning cardiac and pancreatic side effects With stopping this her blood sugars went up to over 300. However she thinks that without taking the medication her feeling of bloating and change in bowel habits were better and overall she felt better also  She was seen by her PCP a couple of weeks ago and was advised to start at least 1 tablet daily because of her  significant hyperglycemia Blood sugars had gone up to as high as 378 but they are still significantly high and glucose was 288 this morning Has not done any readings after meals and did not bring any monitor again FDA upto 378 water Usually her blood sugars are slightly higher in the morning compared to the evening However she has not checked her sugars much at all and not clear what her readings are Her A1c had increased slightly to 7.2 on her last visit but now it is back to 7% She has taken her Amaryl at suppertime and  taking 0.5 mg at this time She has still been watching  her portions and is generally avoiding high carbohydrate meals She has lost significant amount of weight also       Oral hypoglycemic drugs the patient is taking are:  Glimeperide 0.5mg  pcs, Kombiglyze 1 daily Side effects from medications have been:  GI side effects from 2000 mg metformin ER  Glucose monitoring:  done infrequently, mostly in the morning        Glucometer: Freestyle    Blood Glucose readings from recall: As above  Glycemic control:   Lab Results  Component Value Date   HGBA1C 6.9* 08/24/2014   HGBA1C 7.0* 08/13/2014   HGBA1C 7.2* 04/09/2014   Lab Results  Component Value Date   MICROALBUR 1.5 04/09/2014  LDLCALC 97 04/09/2014   CREATININE 0.93 08/24/2014   Self-care: The diet that the patient has been following is:  usually low fat, low carbs.  Eating egg in am sometimes cereal Usually has fruit for snacks   Avoiding drinks with sugar, dinner is at 5-7 pm            Exercise: walks regularly   Dietician visit: Most recent:.2002 attended a class               Compliance with the medical regimen: fair Retinal exam: Most recent: 12/13  Weight history: 125 upto 173, her lowest weight was in 11/2011  Wt Readings from Last 3 Encounters:  10/17/14 151 lb 12.8 oz (68.856 kg)  10/05/14 150 lb (68.04 kg)  08/24/14 160 lb (72.576 kg)       Medication List       This list is accurate  as of: 10/17/14  9:38 PM.  Always use your most recent med list.               aspirin 81 MG tablet  Take 81 mg by mouth daily.     atorvastatin 10 MG tablet  Commonly known as:  LIPITOR  Take 1 tablet (10 mg total) by mouth daily.     B-12 IJ  Inject as directed.     canagliflozin 100 MG Tabs tablet  Commonly known as:  INVOKANA  Take 1 tablet (100 mg total) by mouth daily.     freestyle lancets  Use as instructed to check blood sugars 3 times per day     glimepiride 1 MG tablet  Commonly known as:  AMARYL  TAKE 1/2 TABLET BY MOUTH DAILY     glucose blood test strip  Commonly known as:  FREESTYLE LITE  Use as instructed to check blood sugars 3 times per day dx code 250.02     losartan 100 MG tablet  Commonly known as:  COZAAR  TAKE 1 TABLET BY MOUTH DAILY     SitaGLIPtin-MetFORMIN HCl [phone] MG Tb24  Commonly known as:  JANUMET XR  1 daily        Allergies:  Allergies  Allergen Reactions  . Penicillins Hives    Back of neck and upper back.    Past Medical History  Diagnosis Date  . Hypertension   . Diabetes mellitus 12 yrs ago   . Biliary colic   . Anemia   . Constipation   . Unexplained weight loss   . Abdominal pain   . Vomiting   . Weakness     Past Surgical History  Procedure Laterality Date  . Wisdom tooth extraction    . Cesarean section  11/29/1990, 04/06/1994  . Knee surgery  11/2000    right  . Mouth surgery  04/04/1987  . Colonoscopy  09/01/2004    Family History  Problem Relation Age of Onset  . Cancer Mother     breast  . Diabetes Father   . Thyroid disease Sister   . Hypertension Neg Hx     Social History:  reports that she has never smoked. She has never used smokeless tobacco. She reports that she does not drink alcohol or use illicit drugs.    Review of Systems     HYPERTENSION: blood pressure has been treated with Cozaar, this was increased to 100 mg on her previous visit Although blood pressure is consistently high  here in the office visit and with her PCP she thinks blood  pressures are better at home She checks blood pressure with her [facility] brand monitor Home readings are: 932-671  systolic and 24--58 diastolic  She took a list of her blood pressure readings to her PCP and medication was not changed  She had been complaining  about a sensation of tingling and some numbness in her feet in the toes and some discomfort. This does not bother her at night as much.  She says that with starting B-12 therapy from her PCP symptoms may be a little better      Lipids:  She has  started taking Lipitor that was prescribed in 12/2013 and her LDL is at target Relatively low HDL persists   Lab Results  Component Value Date   CHOL 149 04/09/2014   HDL 37.40* 04/09/2014   LDLCALC 97 04/09/2014   LDLDIRECT 175.6 07/28/2013   TRIG 75.0 04/09/2014   CHOLHDL 4 04/09/2014    Diabetic foot exam done in 08/2014 normal except for Decreased distal monofilament sensation on the toes and plantar surfaces  She has been diagnosed with vitamin B-12 deficiency and is on injectable treatment  Physical Examination:  BP 170/88 mmHg  Pulse 96  Temp(Src) 98.2 F (36.8 C)  Resp 16  Ht 5\' 5"  (1.651 m)  Wt 151 lb 12.8 oz (68.856 kg)  BMI 25.26 kg/m2  SpO2 97%  Repeat blood pressure  150/86  ASSESSMENT/PLAN:   Diabetes type 2   Her blood sugars are totally out of control now with stopping her Kombiglyze She did this on her own without consulting [place] and blood sugars had gone up to over 300, probably associated with symptoms of hyperglycemia and weight loss She is still not getting enough control with taking half the dose over the last 2 weeks She is reporting some GI side effects with taking the higher dose of Kombiglyze and is asking about other medications Still not wanting to go back to insulin Discussed safety of Onglyza and that overall large studies have not shown any known cardiac defects or pancreatitis  especially since she has had no underlying cardiac or pancreatic issues; she has taken this safely for at least a year now  She is however wanting to consider different medication and will be given comminution of Januvia and metformin ER 1000 mg Discussed that there is no long-term side effects reported with this  However because of her significant hyperglycemia she will need to increase her Amaryl to 2 mg at suppertime for now Also to help her control both short-term and long-term she will be started on Invokana 100 mg daily Discussed action of SGLT 2 drugs on lowering glucose by decreasing kidney absorption of glucose, benefits of weight loss and lower blood pressure, possible side effects including candidiasis and dosage regimen   Discussed blood sugar targets, timing of glucose monitoring and to call if blood sugars are not improved Since she is checking only fasting readings emphasized the need for more postprandial monitoring also and bring monitor on each visit  Hypertension: Blood pressure is high in the office again She will benefit from adding Invokana She will need to bring her monitor to compare in the office as blood pressure is usually much higher in the office; she thinks her blood pressure is actually better at home in the afternoon when she comes to see me   Counseling time on subjects discussed above is over 50% of today's 25 minute visit   Patient Instructions  Stop Kombiglyze Start taking JANUMET XR 1  tablet with supper.  This replaces commonly arise  Start taking INVOKANA before breakfast daily.  This is a new medication that will allow the kidney to remove sugar from the body  GLIMEPIRIDE: Start taking 2 tablets at supper time and continue until the morning sugar is below 200 and then reduce it to 1 tablet daily. Once the blood sugars are near 100 may reduce it down to half tablet again  Please check blood sugars at least half the time about 2 hours after any meal  and 3 times per week on waking up. Please bring blood sugar monitor to each visit. Recommended blood sugar levels about 2 hours after meal is 140-180 and on waking up 90-130      Xana Bradt 10/17/2014, 9:38 PM

## 2014-10-17 NOTE — Patient Instructions (Signed)
Stop Kombiglyze Start taking JANUMET XR 1 tablet with supper.  This replaces commonly arise  Start taking INVOKANA before breakfast daily.  This is a new medication that will allow the kidney to remove sugar from the body  GLIMEPIRIDE: Start taking 2 tablets at supper time and continue until the morning sugar is below 200 and then reduce it to 1 tablet daily. Once the blood sugars are near 100 may reduce it down to half tablet again  Please check blood sugars at least half the time about 2 hours after any meal and 3 times per week on waking up. Please bring blood sugar monitor to each visit. Recommended blood sugar levels about 2 hours after meal is 140-180 and on waking up 90-130

## 2014-10-18 NOTE — Telephone Encounter (Signed)
Majority of BP are good.  No change in meds.

## 2014-10-18 NOTE — Telephone Encounter (Signed)
LMTRC

## 2014-10-23 ENCOUNTER — Ambulatory Visit (INDEPENDENT_AMBULATORY_CARE_PROVIDER_SITE_OTHER): Payer: BC Managed Care – PPO | Admitting: Family Medicine

## 2014-10-23 DIAGNOSIS — E538 Deficiency of other specified B group vitamins: Secondary | ICD-10-CM | POA: Diagnosis not present

## 2014-10-23 MED ORDER — CYANOCOBALAMIN 1000 MCG/ML IJ SOLN
1000.0000 ug | Freq: Once | INTRAMUSCULAR | Status: AC
Start: 2014-10-23 — End: 2014-10-23
  Administered 2014-10-23: 1000 ug via INTRAMUSCULAR

## 2014-10-30 ENCOUNTER — Other Ambulatory Visit: Payer: Self-pay | Admitting: *Deleted

## 2014-10-30 ENCOUNTER — Other Ambulatory Visit: Payer: Self-pay | Admitting: Endocrinology

## 2014-10-30 ENCOUNTER — Ambulatory Visit (INDEPENDENT_AMBULATORY_CARE_PROVIDER_SITE_OTHER): Payer: BC Managed Care – PPO | Admitting: Family Medicine

## 2014-10-30 ENCOUNTER — Telehealth: Payer: Self-pay | Admitting: Endocrinology

## 2014-10-30 DIAGNOSIS — D519 Vitamin B12 deficiency anemia, unspecified: Secondary | ICD-10-CM | POA: Diagnosis not present

## 2014-10-30 MED ORDER — GLIMEPIRIDE 1 MG PO TABS: ORAL_TABLET | ORAL | Status: DC

## 2014-10-30 MED ORDER — CYANOCOBALAMIN 1000 MCG/ML IJ SOLN
1000.0000 ug | Freq: Once | INTRAMUSCULAR | Status: AC
  Administered 2014-10-30: 1000 ug via INTRAMUSCULAR

## 2014-10-30 NOTE — Telephone Encounter (Signed)
Can you please call in rx and change Glipiride needs to be changed to 2 tabs daily  Call into [facility] on [address]

## 2014-11-06 ENCOUNTER — Ambulatory Visit (INDEPENDENT_AMBULATORY_CARE_PROVIDER_SITE_OTHER): Payer: BC Managed Care – PPO | Admitting: *Deleted

## 2014-11-06 DIAGNOSIS — E538 Deficiency of other specified B group vitamins: Secondary | ICD-10-CM

## 2014-11-06 MED ORDER — CYANOCOBALAMIN 1000 MCG/ML IJ SOLN
1000.0000 ug | Freq: Once | INTRAMUSCULAR | Status: AC
  Administered 2014-11-06: 1000 ug via INTRAMUSCULAR

## 2014-11-08 ENCOUNTER — Encounter: Payer: Self-pay | Admitting: Family Medicine

## 2014-11-09 ENCOUNTER — Other Ambulatory Visit (INDEPENDENT_AMBULATORY_CARE_PROVIDER_SITE_OTHER): Payer: BC Managed Care – PPO

## 2014-11-09 DIAGNOSIS — IMO0002 Reserved for concepts with insufficient information to code with codable children: Secondary | ICD-10-CM

## 2014-11-09 DIAGNOSIS — E1165 Type 2 diabetes mellitus with hyperglycemia: Secondary | ICD-10-CM

## 2014-11-09 LAB — LIPID PANEL
CHOL/HDL RATIO: 3
Cholesterol: 136 mg/dL (ref 0–200)
HDL: 39.5 mg/dL (ref >39.00)
LDL CALC: 88 mg/dL (ref 0–99)
NonHDL: 96.5
Triglycerides: 44 mg/dL (ref 0.0–149.0)
VLDL: 8.8 mg/dL (ref 0.0–40.0)

## 2014-11-09 LAB — COMPREHENSIVE METABOLIC PANEL
ALT: 11 U/L (ref 0–35)
AST: 19 U/L (ref 0–37)
Albumin: 4.4 g/dL (ref 3.5–5.2)
Alkaline Phosphatase: 74 U/L (ref 39–117)
BUN: 14 mg/dL (ref 6–23)
CHLORIDE: 104 meq/L (ref 96–112)
CO2: 29 meq/L (ref 19–32)
Calcium: 10 mg/dL (ref 8.4–10.5)
Creatinine, Ser: 1.02 mg/dL (ref 0.40–1.20)
GFR: 72.41 mL/min (ref >60.00)
Glucose, Bld: 116 mg/dL — ABNORMAL HIGH (ref 70–99)
Potassium: 4.4 mEq/L (ref 3.5–5.1)
Sodium: 138 mEq/L (ref 135–145)
Total Bilirubin: 0.5 mg/dL (ref 0.2–1.2)
Total Protein: 8.2 g/dL (ref 6.0–8.3)

## 2014-11-09 LAB — HEMOGLOBIN A1C: Hgb A1c MFr Bld: 9.3 % — ABNORMAL HIGH (ref 4.6–6.5)

## 2014-11-13 ENCOUNTER — Ambulatory Visit (INDEPENDENT_AMBULATORY_CARE_PROVIDER_SITE_OTHER): Payer: BC Managed Care – PPO | Admitting: *Deleted

## 2014-11-13 DIAGNOSIS — E538 Deficiency of other specified B group vitamins: Secondary | ICD-10-CM

## 2014-11-13 MED ORDER — CYANOCOBALAMIN 1000 MCG/ML IJ SOLN
1000.0000 ug | Freq: Once | INTRAMUSCULAR | Status: AC
  Administered 2014-11-13: 1000 ug via INTRAMUSCULAR

## 2014-11-15 ENCOUNTER — Ambulatory Visit: Payer: BC Managed Care – PPO | Admitting: Endocrinology

## 2014-11-20 ENCOUNTER — Ambulatory Visit (INDEPENDENT_AMBULATORY_CARE_PROVIDER_SITE_OTHER): Payer: BC Managed Care – PPO | Admitting: *Deleted

## 2014-11-20 DIAGNOSIS — E538 Deficiency of other specified B group vitamins: Secondary | ICD-10-CM

## 2014-11-20 MED ORDER — CYANOCOBALAMIN 1000 MCG/ML IJ SOLN
1000.0000 ug | Freq: Once | INTRAMUSCULAR | Status: AC
  Administered 2014-11-20: 1000 ug via INTRAMUSCULAR

## 2014-11-22 ENCOUNTER — Other Ambulatory Visit: Payer: Self-pay | Admitting: Endocrinology

## 2014-11-22 ENCOUNTER — Ambulatory Visit (INDEPENDENT_AMBULATORY_CARE_PROVIDER_SITE_OTHER): Payer: BC Managed Care – PPO | Admitting: Endocrinology

## 2014-11-22 ENCOUNTER — Encounter: Payer: Self-pay | Admitting: Endocrinology

## 2014-11-22 VITALS — BP 146/84 | HR 94 | Temp 98.6°F | Resp 16 | Ht 65.0 in | Wt 150.2 lb

## 2014-11-22 DIAGNOSIS — IMO0002 Reserved for concepts with insufficient information to code with codable children: Secondary | ICD-10-CM

## 2014-11-22 DIAGNOSIS — I10 Essential (primary) hypertension: Secondary | ICD-10-CM

## 2014-11-22 DIAGNOSIS — E785 Hyperlipidemia, unspecified: Secondary | ICD-10-CM

## 2014-11-22 DIAGNOSIS — E1165 Type 2 diabetes mellitus with hyperglycemia: Secondary | ICD-10-CM

## 2014-11-22 MED ORDER — FREESTYLE LANCETS MISC: Status: DC

## 2014-11-22 MED ORDER — GLUCOSE BLOOD VI STRP: ORAL_STRIP | Status: DC

## 2014-11-22 MED ORDER — CANAGLIFLOZIN 300 MG PO TABS: 300.0000 mg | ORAL_TABLET | Freq: Every day | ORAL | Status: DC

## 2014-11-22 NOTE — Patient Instructions (Signed)
Take 2 Invokana 100mg  in am till gone then go to 300mg  daily  Check blood sugars on waking up ..3.. times a week Also check blood sugars about 2 hours after a meal and do this after different meals by rotation  Recommended blood sugar levels on waking up is 90-130 and about 2 hours after meal is 140-180 Please bring blood sugar monitor to each visit.

## 2014-11-22 NOTE — Progress Notes (Signed)
Patient ID: Sherry Hart, female   DOB: Jul 20, 1960, 54 y.o.   MRN: [id number]   Reason for Appointment : Followup    History of Present Illness          Problem 1:: Type 2 diabetes mellitus, date of diagnosis: 2002       Past history: She had been treated with metformin since diagnosis and previous records are not available for review. She thinks that when she was taking her blood sugar regularly there were usually below 150 in the morning In 2012 because of lack of insurance she did not take her medications or check her sugar for about a year In 6/13 she was evaluated in the emergency room for abdominal pain and was found to have an A1c of 14% with very high blood sugars. Her only symptoms were weight loss. She was given Lantus, unknown dose for about 2 months but she went off this on her own since she did not want to continue this while going to work. Also was presumably given glipizide at that time but no details available She was seen for initial consultation in 1/15 and at that time her glucose was 431 with A1c of 13.1 Initially treated with insulin but this was later replaced with oral agents She started taking Kombiglyze XR on 08/08/13 and had gone up to 2 tablets daily Because of tendency to relatively higher fasting readings she was given low-dose Amaryl in 3/15 in addition  Recent history:  She is here for follow-up, on her last visit she had stopped taking her diabetes medications and had marked hyperglycemia with blood sugars as high as 378 Previously was on Kombiglyze XR and Amaryl; also thought that Kombiglyze XR was causing change in bowel habits and bloating  Because of her reluctance to take the Kombiglyze XR she was switched to Janumet XR; she thinks that she has less GI side effects with this at this time Also because of her inadequate control overall and high blood pressure she was started on Invokana 100 mg daily She is tolerating this well without any candidiasis or  polyuria Amaryl was increased to 2 mg although she did try to take the 1 mg dose and blood sugars were slightly high in the morning with this Her A1c as high as expected Has no change in her weight despite improvement in blood sugars Her blood sugars are still moderately increased overall but usually lower around supper time, has only one recent post prandial reading at night       Oral hypoglycemic drugs the patient is taking are:  Glimeperide 2 mg pcs, Janumet XR 100/1000 daily, Invokana 100 mg Side effects from medications have been:  GI side effects from 2000 mg metformin ER  Glucose monitoring:  done infrequently, mostly in the morning        Glucometer: Freestyle    Blood Glucose readings from download  Mean values apply above for all meters except median for One Touch  PRE-MEAL Fasting Lunch Dinner Bedtime Overall  Glucose range:  124-197   159   75-94   176, 225    Mean/median:         Glycemic control:   Lab Results  Component Value Date   HGBA1C 9.3* 11/09/2014   HGBA1C 6.9* 08/24/2014   HGBA1C 7.0* 08/13/2014   Lab Results  Component Value Date   MICROALBUR 1.5 04/09/2014   LDLCALC 88 11/09/2014   CREATININE 1.02 11/09/2014   Self-care: The diet that the patient  has been following is:  usually low fat, low carbs.  Eating egg in am sometimes cereal Usually has fruit for snacks   Avoiding drinks with sugar, dinner is at 5-7 pm            Exercise: walks regularly   Dietician visit: Most recent:.2002 attended a class               Compliance with the medical regimen: fair Retinal exam: Most recent: 12/13  Weight history: 125 upto 173, her lowest weight was in 11/2011  Wt Readings from Last 3 Encounters:  11/22/14 150 lb 3.2 oz (68.13 kg)  10/17/14 151 lb 12.8 oz (68.856 kg)  10/05/14 150 lb (68.04 kg)       Medication List       This list is accurate as of: 11/22/14 11:59 PM.  Always use your most recent med list.               aspirin 81 MG tablet   Take 81 mg by mouth daily.     atorvastatin 10 MG tablet  Commonly known as:  LIPITOR  TAKE 1 TABLET BY MOUTH DAILY     B-12 IJ  Inject as directed.     canagliflozin 300 MG Tabs tablet  Commonly known as:  INVOKANA  Take 300 mg by mouth daily before breakfast.     freestyle lancets  Use as instructed to check blood sugars 3 times per day     glimepiride 1 MG tablet  Commonly known as:  AMARYL  Take 2 tablets with supper until blood sugar is under 200 then take 1 tablet daily     glucose blood test strip  Commonly known as:  FREESTYLE LITE  Use as instructed to check blood sugars 3 times per day dx code 250.02     JANUMET XR [phone] MG Tb24  Generic drug:  SitaGLIPtin-MetFORMIN HCl  TAKE 1 TABLET BY MOUTH EVERY DAY     losartan 100 MG tablet  Commonly known as:  COZAAR  TAKE 1 TABLET BY MOUTH DAILY        Allergies:  Allergies  Allergen Reactions  . Penicillins Hives    Back of neck and upper back.    Past Medical History  Diagnosis Date  . Hypertension   . Diabetes mellitus 12 yrs ago   . Biliary colic   . Anemia   . Constipation   . Unexplained weight loss   . Abdominal pain   . Vomiting   . Weakness     Past Surgical History  Procedure Laterality Date  . Wisdom tooth extraction    . Cesarean section  11/29/1990, 04/06/1994  . Knee surgery  11/2000    right  . Mouth surgery  04/04/1987  . Colonoscopy  09/01/2004    Family History  Problem Relation Age of Onset  . Cancer Mother     breast  . Diabetes Father   . Thyroid disease Sister   . Hypertension Neg Hx     Social History:  reports that she has never smoked. She has never used smokeless tobacco. She reports that she does not drink alcohol or use illicit drugs.    Review of Systems     HYPERTENSION: blood pressure has been treated with Cozaar, this was increased to 100 mg on her previous visit Although blood pressure is consistently high here in the office visit and with her PCP she  thinks blood pressures are better at home  She checks blood pressure with her [facility] brand monitor Home readings are: 952-841  systolic and 32--44 diastolic  She has not kept a record of her blood pressure readings but the reading has been fairly consistent at home       Lipids:  She has been taking Lipitor that was prescribed in 12/2013 and her LDL is at target Relatively low HDL persists   Lab Results  Component Value Date   CHOL 136 11/09/2014   HDL 39.50 11/09/2014   LDLCALC 88 11/09/2014   LDLDIRECT 175.6 07/28/2013   TRIG 44.0 11/09/2014   CHOLHDL 3 11/09/2014    Diabetic foot exam done in 08/2014 normal except for Decreased distal monofilament sensation on the toes and plantar surfaces  She has vitamin B-12 deficiency and is on injectable treatment  Physical Examination:  BP 146/84 mmHg  Pulse 94  Temp(Src) 98.6 F (37 C)  Resp 16  Ht 5\' 5"  (1.651 m)  Wt 150 lb 3.2 oz (68.13 kg)  BMI 24.99 kg/m2  SpO2 97%  Repeat blood pressure 142/88  ASSESSMENT/PLAN:   Diabetes type 2   Her blood sugars are improving with quadruple therapy using any mid-XR, Invokana and Amaryl Her average blood sugar recently is 156 and she still has mildly increased fasting readings Has done only rare blood sugars after her evening meal and these may be slightly high also  Since she is responding to adding Invokana and has no side effects can benefit from using 300 mg She should also continue the 2 mg Amaryl unless she starts getting low blood sugars Since she is checking mostly fasting readings emphasized the need for more postprandial monitoring also and bring monitor on each visit  Hypertension: Blood pressure is slightly high in the office again She will benefit from increasing Invokana She will need to bring her monitor to compare in the office as blood pressure is usually much higher in the office    Patient Instructions  Take 2 Invokana 100mg  in am till gone then go to 300mg   daily  Check blood sugars on waking up ..3.. times a week Also check blood sugars about 2 hours after a meal and do this after different meals by rotation  Recommended blood sugar levels on waking up is 90-130 and about 2 hours after meal is 140-180 Please bring blood sugar monitor to each visit.      Cassady Stanczak 11/23/2014, 2:56 PM   Note: This office note was prepared with Dragon voice recognition system technology. Any transcriptional errors that result from this process are unintentional.

## 2014-12-20 ENCOUNTER — Ambulatory Visit (INDEPENDENT_AMBULATORY_CARE_PROVIDER_SITE_OTHER): Payer: BC Managed Care – PPO | Admitting: *Deleted

## 2014-12-20 DIAGNOSIS — D519 Vitamin B12 deficiency anemia, unspecified: Secondary | ICD-10-CM | POA: Diagnosis not present

## 2014-12-20 MED ORDER — CYANOCOBALAMIN 1000 MCG/ML IJ SOLN
1000.0000 ug | Freq: Once | INTRAMUSCULAR | Status: AC
  Administered 2014-12-20: 1000 ug via INTRAMUSCULAR

## 2015-01-17 ENCOUNTER — Encounter: Payer: Self-pay | Admitting: Endocrinology

## 2015-01-21 ENCOUNTER — Ambulatory Visit (INDEPENDENT_AMBULATORY_CARE_PROVIDER_SITE_OTHER): Payer: BC Managed Care – PPO | Admitting: *Deleted

## 2015-01-21 DIAGNOSIS — E538 Deficiency of other specified B group vitamins: Secondary | ICD-10-CM | POA: Diagnosis not present

## 2015-01-21 MED ORDER — CYANOCOBALAMIN 1000 MCG/ML IJ SOLN
1000.0000 ug | Freq: Once | INTRAMUSCULAR | Status: AC
  Administered 2015-01-21: 1000 ug via INTRAMUSCULAR

## 2015-01-22 ENCOUNTER — Other Ambulatory Visit (INDEPENDENT_AMBULATORY_CARE_PROVIDER_SITE_OTHER): Payer: BC Managed Care – PPO

## 2015-01-22 DIAGNOSIS — E1165 Type 2 diabetes mellitus with hyperglycemia: Secondary | ICD-10-CM | POA: Diagnosis not present

## 2015-01-22 DIAGNOSIS — IMO0002 Reserved for concepts with insufficient information to code with codable children: Secondary | ICD-10-CM

## 2015-01-22 LAB — BASIC METABOLIC PANEL
BUN: 18 mg/dL (ref 6–23)
CALCIUM: 9.8 mg/dL (ref 8.4–10.5)
CHLORIDE: 105 meq/L (ref 96–112)
CO2: 28 meq/L (ref 19–32)
Creatinine, Ser: 1.12 mg/dL (ref 0.40–1.20)
GFR: 64.96 mL/min (ref >60.00)
GLUCOSE: 169 mg/dL — AB (ref 70–99)
POTASSIUM: 4.3 meq/L (ref 3.5–5.1)
SODIUM: 140 meq/L (ref 135–145)

## 2015-01-23 LAB — FRUCTOSAMINE: Fructosamine: 291 umol/L — ABNORMAL HIGH (ref 0–285)

## 2015-01-24 ENCOUNTER — Ambulatory Visit (INDEPENDENT_AMBULATORY_CARE_PROVIDER_SITE_OTHER): Payer: BC Managed Care – PPO | Admitting: Endocrinology

## 2015-01-24 ENCOUNTER — Encounter: Payer: Self-pay | Admitting: Endocrinology

## 2015-01-24 VITALS — BP 150/82 | HR 100 | Temp 98.3°F | Resp 14 | Ht 65.0 in | Wt 151.2 lb

## 2015-01-24 DIAGNOSIS — E1165 Type 2 diabetes mellitus with hyperglycemia: Secondary | ICD-10-CM | POA: Diagnosis not present

## 2015-01-24 DIAGNOSIS — IMO0002 Reserved for concepts with insufficient information to code with codable children: Secondary | ICD-10-CM

## 2015-01-24 DIAGNOSIS — I10 Essential (primary) hypertension: Secondary | ICD-10-CM | POA: Diagnosis not present

## 2015-01-24 NOTE — Progress Notes (Signed)
Patient ID: Sherry Hart, female   DOB: [birth year], 54 y.o.   MRN: [id number]   Reason for Appointment : Followup    History of Present Illness          Problem 1:: Type 2 diabetes mellitus, date of diagnosis: 2002       Past history: She had been treated with metformin since diagnosis and previous records are not available for review. She thinks that when she was taking her blood sugar regularly there were usually below 150 in the morning In 2012 because of lack of insurance she did not take her medications or check her sugar for about a year In 6/13 she was evaluated in the emergency room for abdominal pain and was found to have an A1c of 14% with very high blood sugars. Her only symptoms were weight loss. She was given Lantus, unknown dose for about 2 months but she went off this on her own since she did not want to continue this while going to work. Also was presumably given glipizide at that time but no details available She was seen for initial consultation in 1/15 and at that time her glucose was 431 with A1c of 13.1 Initially treated with insulin but this was later replaced with oral agents She started taking Kombiglyze XR on 08/08/13 and had gone up to 2 tablets daily Because of tendency to relatively higher fasting readings she was given low-dose Amaryl in 3/15 in addition  Recent history:  She is on a multiple drug regimen currently including Invokana She is tolerating this well without any candidiasis or polyuria and this appears to have improved her blood sugar control No recent A1c is available Amaryl was continued at 1 mg in the evenings Her blood sugars are improving compared to her last visit although she is checking very sporadically and mostly in the mornings Hypoglycemia: This may occur occasionally midday especially when she is eating cereal in the morning for breakfast or going to 3 long before lunch    Oral hypoglycemic drugs the patient is taking are:  Glimeperide 1  mg pcs, Janumet XR 100/1000 daily, Invokana 100 mg Side effects from medications have been:  GI side effects from 2000 mg metformin ER  Glucose monitoring:  done infrequently, mostly in the morning        Glucometer: Freestyle    Blood Glucose readings from download show fasting range of 108-163 and nonfasting 59-118 Overall average 114  Glycemic control:   Lab Results  Component Value Date   HGBA1C 9.3* 11/09/2014   HGBA1C 6.9* 08/24/2014   HGBA1C 7.0* 08/13/2014   Lab Results  Component Value Date   MICROALBUR 1.5 04/09/2014   LDLCALC 88 11/09/2014   CREATININE 1.12 01/22/2015   Self-care: The diet that the patient has been following is:  usually low fat, low carbs.  Eating egg in am, usually cereal if she is going to work Usually has fruit for snacks   Avoiding drinks with sugar, dinner is at 5-7 pm            Exercise: walks regularly   Dietician visit: Most recent:.2002 attended a class               Compliance with the medical regimen: fair Retinal exam: Most recent: 12/13  Weight history: 125 upto 173, her lowest weight was in 11/2011  Wt Readings from Last 3 Encounters:  01/24/15 151 lb 3.2 oz (68.584 kg)  11/22/14 150 lb 3.2 oz (68.13 kg)  10/17/14 151 lb 12.8 oz (68.856 kg)       Medication List       This list is accurate as of: 01/24/15 11:59 PM.  Always use your most recent med list.               aspirin 81 MG tablet  Take 81 mg by mouth daily.     atorvastatin 10 MG tablet  Commonly known as:  LIPITOR  TAKE 1 TABLET BY MOUTH DAILY     B-12 IJ  Inject as directed.     canagliflozin 300 MG Tabs tablet  Commonly known as:  INVOKANA  Take 300 mg by mouth daily before breakfast.     freestyle lancets  Use as instructed to check blood sugars 3 times per day     glimepiride 1 MG tablet  Commonly known as:  AMARYL  Take 2 tablets with supper until blood sugar is under 200 then take 1 tablet daily     glucose blood test strip  Commonly known  as:  FREESTYLE LITE  Use as instructed to check blood sugars 3 times per day dx code 250.02     JANUMET XR [phone] MG Tb24  Generic drug:  SitaGLIPtin-MetFORMIN HCl  TAKE 1 TABLET BY MOUTH EVERY DAY     losartan 100 MG tablet  Commonly known as:  COZAAR  TAKE 1 TABLET BY MOUTH DAILY        Allergies:  Allergies  Allergen Reactions  . Penicillins Hives    Back of neck and upper back.    Past Medical History  Diagnosis Date  . Hypertension   . Diabetes mellitus 12 yrs ago   . Biliary colic   . Anemia   . Constipation   . Unexplained weight loss   . Abdominal pain   . Vomiting   . Weakness     Past Surgical History  Procedure Laterality Date  . Wisdom tooth extraction    . Cesarean section  11/29/1990, 04/06/1994  . Knee surgery  11/2000    right  . Mouth surgery  04/04/1987  . Colonoscopy  09/01/2004    Family History  Problem Relation Age of Onset  . Cancer Mother     breast  . Diabetes Father   . Thyroid disease Sister   . Hypertension Neg Hx     Social History:  reports that she has never smoked. She has never used smokeless tobacco. She reports that she does not drink alcohol or use illicit drugs.    Review of Systems     HYPERTENSION: blood pressure has been treated with Cozaar, this was reduced to half a tablet with starting Invokana  Her blood pressure tends to be higher in the office and was better with the PCP recently     Lipids:  She has been taking Lipitor that was prescribed in 12/2013 and her LDL is at target Relatively low HDL persists   Lab Results  Component Value Date   CHOL 136 11/09/2014   HDL 39.50 11/09/2014   LDLCALC 88 11/09/2014   LDLDIRECT 175.6 07/28/2013   TRIG 44.0 11/09/2014   CHOLHDL 3 11/09/2014    Diabetic foot exam done in 08/2014 normal except for Decreased distal monofilament sensation on the toes and plantar surfaces  She has vitamin B-12 deficiency and is on injectable treatment  Physical  Examination:  BP 150/82 mmHg  Pulse 100  Temp(Src) 98.3 F (36.8 C)  Resp 14  Ht 5\' 5"  (  1.651 m)  Wt 151 lb 3.2 oz (68.584 kg)  BMI 25.16 kg/m2  SpO2 97%   ASSESSMENT/PLAN:   Diabetes type 2   Her blood sugars are improving with quadruple therapy using Janumet XR Invokana and low dose Amaryl However will need to do an A1c to confirm her level of control since she checks her blood sugars very infrequently and mostly in the mornings She does occasionally tend to feel hypoglycemic at lunch most likely because of not getting protein in the morning and sometimes getting late for meals; discussed need for better meal planning  Since she is checking mostly fasting readings emphasized the need for more postprandial monitoring also and bring monitor on each visit  Hypertension: Blood pressure is better with increasing the dose of Invokana Tends to have higher readings in the office here and will continue follow-up with PCP No change in losartan as yet    Patient Instructions  Check blood sugars on waking up .Marland Kitchen 2-3 .Marland Kitchen times a week Also check blood sugars about 2 hours after a meal and do this after different meals by rotation  Recommended blood sugar levels on waking up is 90-130 and about 2 hours after meal is 140-180 Please bring blood sugar monitor to each visit.   Mid morning snack with protein if eating cereal in am       [facility] 01/28/2015, 9:53 AM   Note: This office note was prepared with Estate agent. Any transcriptional errors that result from this process are unintentional.

## 2015-01-24 NOTE — Patient Instructions (Addendum)
Check blood sugars on waking up .Marland Kitchen 2-3 .Marland Kitchen times a week Also check blood sugars about 2 hours after a meal and do this after different meals by rotation  Recommended blood sugar levels on waking up is 90-130 and about 2 hours after meal is 140-180 Please bring blood sugar monitor to each visit.   Mid morning snack with protein if eating cereal in am

## 2015-01-25 ENCOUNTER — Ambulatory Visit: Payer: BC Managed Care – PPO | Admitting: Endocrinology

## 2015-02-20 ENCOUNTER — Other Ambulatory Visit: Payer: BC Managed Care – PPO

## 2015-02-20 DIAGNOSIS — E538 Deficiency of other specified B group vitamins: Secondary | ICD-10-CM

## 2015-02-20 LAB — VITAMIN B12: Vitamin B-12: 322 pg/mL (ref 211–911)

## 2015-02-27 ENCOUNTER — Telehealth: Payer: Self-pay | Admitting: Family Medicine

## 2015-02-27 NOTE — Telephone Encounter (Signed)
Pt calling to get results of her B12 she had done here on 02/20/15 - Does she need to continue to take the B12 injections q month?? (OK to leave message on home answering machine)

## 2015-02-28 ENCOUNTER — Ambulatory Visit (INDEPENDENT_AMBULATORY_CARE_PROVIDER_SITE_OTHER): Payer: BC Managed Care – PPO | Admitting: Family Medicine

## 2015-02-28 DIAGNOSIS — E538 Deficiency of other specified B group vitamins: Secondary | ICD-10-CM | POA: Diagnosis not present

## 2015-02-28 MED ORDER — CYANOCOBALAMIN 1000 MCG/ML IJ SOLN
1000.0000 ug | Freq: Once | INTRAMUSCULAR | Status: AC
  Administered 2015-02-28: 1000 ug via INTRAMUSCULAR

## 2015-02-28 NOTE — Telephone Encounter (Signed)
Patient aware of results via vm  

## 2015-02-28 NOTE — Telephone Encounter (Signed)
Needs to remain on B12 because this is keeping her levels normal.  Without it, her levels will drop.

## 2015-03-21 ENCOUNTER — Other Ambulatory Visit (INDEPENDENT_AMBULATORY_CARE_PROVIDER_SITE_OTHER): Payer: BC Managed Care – PPO

## 2015-03-21 DIAGNOSIS — E1165 Type 2 diabetes mellitus with hyperglycemia: Secondary | ICD-10-CM | POA: Diagnosis not present

## 2015-03-21 DIAGNOSIS — IMO0002 Reserved for concepts with insufficient information to code with codable children: Secondary | ICD-10-CM

## 2015-03-21 LAB — MICROALBUMIN / CREATININE URINE RATIO
CREATININE, U: 85.2 mg/dL
MICROALB/CREAT RATIO: 0.8 mg/g (ref 0.0–30.0)
Microalb, Ur: 0.7 mg/dL (ref 0.0–1.9)

## 2015-03-21 LAB — BASIC METABOLIC PANEL
BUN: 16 mg/dL (ref 6–23)
CHLORIDE: 102 meq/L (ref 96–112)
CO2: 27 meq/L (ref 19–32)
Calcium: 9.7 mg/dL (ref 8.4–10.5)
Creatinine, Ser: 1.02 mg/dL (ref 0.40–1.20)
GFR: 72.32 mL/min (ref >60.00)
GLUCOSE: 123 mg/dL — AB (ref 70–99)
POTASSIUM: 4.8 meq/L (ref 3.5–5.1)
SODIUM: 139 meq/L (ref 135–145)

## 2015-03-21 LAB — HEMOGLOBIN A1C: HEMOGLOBIN A1C: 6.9 % — AB (ref 4.6–6.5)

## 2015-03-27 ENCOUNTER — Other Ambulatory Visit: Payer: Self-pay | Admitting: Endocrinology

## 2015-03-27 ENCOUNTER — Ambulatory Visit (INDEPENDENT_AMBULATORY_CARE_PROVIDER_SITE_OTHER): Payer: BC Managed Care – PPO | Admitting: Endocrinology

## 2015-03-27 ENCOUNTER — Encounter: Payer: Self-pay | Admitting: Endocrinology

## 2015-03-27 VITALS — BP 162/76 | HR 88 | Temp 98.0°F | Resp 14 | Ht 65.0 in | Wt 152.8 lb

## 2015-03-27 DIAGNOSIS — E1140 Type 2 diabetes mellitus with diabetic neuropathy, unspecified: Secondary | ICD-10-CM | POA: Diagnosis not present

## 2015-03-27 DIAGNOSIS — E1165 Type 2 diabetes mellitus with hyperglycemia: Secondary | ICD-10-CM | POA: Diagnosis not present

## 2015-03-27 DIAGNOSIS — Z23 Encounter for immunization: Secondary | ICD-10-CM

## 2015-03-27 DIAGNOSIS — IMO0002 Reserved for concepts with insufficient information to code with codable children: Secondary | ICD-10-CM

## 2015-03-27 NOTE — Progress Notes (Signed)
Patient ID: Sherry Hart, female   DOB: [birth year], 54 y.o.   MRN: [id number]   Reason for Appointment : Followup    History of Present Illness          Problem 1: Type 2 diabetes mellitus, date of diagnosis: 2002       Past history: She had been treated with metformin since diagnosis and previous records are not available for review. She thinks that when she was taking her blood sugar regularly there were usually below 150 in the morning In 2012 because of lack of insurance she did not take her medications or check her sugar for about a year In 6/13 she was evaluated in the emergency room for abdominal pain and was found to have an A1c of 14% with very high blood sugars. Her only symptoms were weight loss. She was given Lantus, unknown dose for about 2 months but she went off this on her own since she did not want to continue this while going to work. Also was presumably given glipizide at that time but no details available She was seen for initial consultation in 1/15 and at that time her glucose was 431 with A1c of 13.1 Initially treated with insulin but this was later replaced with oral agents She started taking Kombiglyze XR on 08/08/13 and had gone up to 2 tablets daily Because of tendency to relatively higher fasting readings she was given low-dose Amaryl in 3/15 in addition  Recent history:  She is on a multiple drug regimen including Invokana which has helped her control Her A1c is much better than it was in 5/16, now down to 6.9  Amaryl has been continued at a half tablet of 1 mg in the evenings Current blood sugars at home following:  Fasting readings are fairly consistent except for high reading this morning which she thinks is from eating late last night but also had more meat  She has only a few readings during the daytime which are relatively low before supper without symptomatic hypoglycemia  She has not done any blood sugars after supper  Her weight has  been about the same   Oral hypoglycemic drugs the patient is taking are:  Glimeperide 0.5 mg pcs, Janumet XR 100/1000 daily, Invokana 300 mg Side effects from medications have been:  GI side effects from 2000 mg metformin ER  Glucose monitoring:  done infrequently, mostly in the morning        Glucometer: Freestyle    Blood Glucose readings from download  Mean values apply above for all meters except median for One Touch  PRE-MEAL Fasting Lunch Dinner Bedtime Overall  Glucose range:  115-159   134   66-79     Mean/median:  128      110    Glycemic control:   Lab Results  Component Value Date   HGBA1C 6.9* 03/21/2015   HGBA1C 9.3* 11/09/2014   HGBA1C 6.9* 08/24/2014   Lab Results  Component Value Date   MICROALBUR 0.7 03/21/2015   LDLCALC 88 11/09/2014   CREATININE 1.02 03/21/2015   Self-care: The diet that the patient has been following is:  usually low fat, low carbs.  Eating egg in am, usually cereal if she is going to work Usually has fruit for snacks   Avoiding drinks with sugar, dinner is at 5-7 pm            Exercise: walks regularly 45 min   Dietician visit: Most recent: 2002 attended  a class               Compliance with the medical regimen: fair Retinal exam: Most recent: 12/13  Weight history: 125 upto 173, her lowest weight was in 11/2011  Wt Readings from Last 3 Encounters:  03/27/15 152 lb 12.8 oz (69.31 kg)  01/24/15 151 lb 3.2 oz (68.584 kg)  11/22/14 150 lb 3.2 oz (68.13 kg)       Medication List       This list is accurate as of: 03/27/15  8:55 PM.  Always use your most recent med list.               aspirin 81 MG tablet  Take 81 mg by mouth daily.     atorvastatin 10 MG tablet  Commonly known as:  LIPITOR  TAKE 1 TABLET BY MOUTH DAILY     B-12 IJ  Inject as directed.     freestyle lancets  Use as instructed to check blood sugars 3 times per day     glimepiride 1 MG tablet  Commonly known as:  AMARYL  Take 2 tablets with supper  until blood sugar is under 200 then take 1 tablet daily     glucose blood test strip  Commonly known as:  FREESTYLE LITE  Use as instructed to check blood sugars 3 times per day dx code 250.02     INVOKANA 300 MG Tabs tablet  Generic drug:  canagliflozin  TAKE 1 TABLET BY MOUTH BEFORE BREAKFAST     JANUMET XR [phone] MG Tb24  Generic drug:  SitaGLIPtin-MetFORMIN HCl  TAKE 1 TABLET BY MOUTH EVERY DAY     losartan 100 MG tablet  Commonly known as:  COZAAR  TAKE 1 TABLET BY MOUTH DAILY        Allergies:  Allergies  Allergen Reactions  . Penicillins Hives    Back of neck and upper back.    Past Medical History  Diagnosis Date  . Hypertension   . Diabetes mellitus 12 yrs ago   . Biliary colic   . Anemia   . Constipation   . Unexplained weight loss   . Abdominal pain   . Vomiting   . Weakness     Past Surgical History  Procedure Laterality Date  . Wisdom tooth extraction    . Cesarean section  11/29/1990, 04/06/1994  . Knee surgery  11/2000    right  . Mouth surgery  04/04/1987  . Colonoscopy  09/01/2004    Family History  Problem Relation Age of Onset  . Cancer Mother     breast  . Diabetes Father   . Thyroid disease Sister   . Hypertension Neg Hx     Social History:  reports that she has never smoked. She has never used smokeless tobacco. She reports that she does not drink alcohol or use illicit drugs.    Review of Systems     HYPERTENSION: blood pressure has been treated with Cozaar, this was reduced to half a tablet with starting Invokana  Her blood pressure tends to be higher in the office   BP 119/79 at home, upto 127/90.  Lipids:  She has been taking Lipitor that was prescribed in 12/2013 and her LDL is at target Relatively low HDL .   Lab Results  Component Value Date   CHOL 136 11/09/2014   HDL 39.50 11/09/2014   LDLCALC 88 11/09/2014   LDLDIRECT 175.6 07/28/2013   TRIG 44.0 11/09/2014  CHOLHDL 3 11/09/2014    Diabetic foot exam  done in 08/2014 normal except for Decreased distal monofilament sensation on the toes and plantar surfaces  She has vitamin B-12 deficiency and is on injectable treatment monthly from PCP  Physical Examination:  BP 162/76 mmHg  Pulse 88  Temp(Src) 98 F (36.7 C)  Resp 14  Ht 5\' 5"  (1.651 m)  Wt 152 lb 12.8 oz (69.31 kg)  BMI 25.43 kg/m2  SpO2 96%  Repeat blood pressure 148/76  ASSESSMENT/PLAN:   Diabetes type 2   Her blood sugars are fairly well controlled with quadruple therapy using Janumet XR, 300 mg Invokana and low dose Amaryl She has excellent blood sugars at home but has not done any readings after supper No hypoglycemia currently, lowest reading 66 at suppertime She is fairly compliant with her diet and exercise regimen  She will continue the same regimen, to check more readings after supper Also if she will need to to change her medications next year she will need to let us know be approved formulary choices  Hypertension: Blood pressure is still relatively high but she thinks readings are very good at home with occasional high normal diastolic readings Tends to have higher readings in the office here and will continue follow-up with PCP No change in losartan today    Patient Instructions   Check blood sugars on waking up .Marland Kitchen2-3  .Marland Kitchen times a week Also check blood sugars about 2 hours after a meal and do this after different meals by rotation  Recommended blood sugar levels on waking up is 90-130 and about 2 hours after meal is 120-160 Please bring blood sugar monitor to each visit.  Lab Results  Component Value Date   HGBA1C 6.9* 03/21/2015   Lab Results  Component Value Date   CHOL 136 11/09/2014   HDL 39.50 11/09/2014   LDLCALC 88 11/09/2014   LDLDIRECT 175.6 07/28/2013   TRIG 44.0 11/09/2014   CHOLHDL 3 11/09/2014        Chi Woodham 03/27/2015, 8:55 PM   Note: This office note was prepared with Estate agent. Any  transcriptional errors that result from this process are unintentional.

## 2015-03-27 NOTE — Patient Instructions (Signed)
Check blood sugars on waking up .Marland Kitchen2-3  .Marland Kitchen times a week Also check blood sugars about 2 hours after a meal and do this after different meals by rotation  Recommended blood sugar levels on waking up is 90-130 and about 2 hours after meal is 120-160 Please bring blood sugar monitor to each visit.  Lab Results  Component Value Date   HGBA1C 6.9* 03/21/2015   Lab Results  Component Value Date   CHOL 136 11/09/2014   HDL 39.50 11/09/2014   LDLCALC 88 11/09/2014   LDLDIRECT 175.6 07/28/2013   TRIG 44.0 11/09/2014   CHOLHDL 3 11/09/2014

## 2015-04-01 ENCOUNTER — Ambulatory Visit (INDEPENDENT_AMBULATORY_CARE_PROVIDER_SITE_OTHER): Payer: BC Managed Care – PPO | Admitting: *Deleted

## 2015-04-01 DIAGNOSIS — D539 Nutritional anemia, unspecified: Secondary | ICD-10-CM

## 2015-04-01 MED ORDER — CYANOCOBALAMIN 1000 MCG/ML IJ SOLN
1000.0000 ug | Freq: Once | INTRAMUSCULAR | Status: AC
  Administered 2015-04-01: 1000 ug via INTRAMUSCULAR

## 2015-04-13 ENCOUNTER — Other Ambulatory Visit: Payer: Self-pay | Admitting: Endocrinology

## 2015-04-16 ENCOUNTER — Telehealth: Payer: Self-pay | Admitting: Endocrinology

## 2015-04-16 NOTE — Telephone Encounter (Signed)
I contacted the pharmacy and gave verbal for Janumet Xr.

## 2015-04-16 NOTE — Telephone Encounter (Signed)
Christie from [facility] called to verify E scribed medication for Janumet, please advise [phone]

## 2015-04-23 ENCOUNTER — Other Ambulatory Visit: Payer: Self-pay | Admitting: *Deleted

## 2015-04-23 ENCOUNTER — Telehealth: Payer: Self-pay | Admitting: Endocrinology

## 2015-04-23 MED ORDER — GLUCOSE BLOOD VI STRP: ORAL_STRIP | Status: DC

## 2015-04-23 MED ORDER — ONETOUCH ULTRA 2 W/DEVICE KIT: PACK | Status: DC

## 2015-04-23 MED ORDER — EMPAGLIFLOZIN 25 MG PO TABS: 25.0000 mg | ORAL_TABLET | Freq: Every day | ORAL | Status: DC

## 2015-04-23 NOTE — Telephone Encounter (Signed)
She can start Jardiance 25 mg daily after Invokana runs out, One Touch ultra 2 monitor would be preferred

## 2015-04-23 NOTE — Telephone Encounter (Signed)
Please see both messages below and advise.

## 2015-04-23 NOTE — Telephone Encounter (Signed)
rx's have been sent to [facility]

## 2015-04-23 NOTE — Telephone Encounter (Signed)
Invokana alternates are [place] and [place], insurance will cover those. Please [facility]

## 2015-04-23 NOTE — Telephone Encounter (Signed)
One touch meter and supplies with be covered by insurance now any model of the one touch

## 2015-05-03 ENCOUNTER — Ambulatory Visit (INDEPENDENT_AMBULATORY_CARE_PROVIDER_SITE_OTHER): Payer: BC Managed Care – PPO | Admitting: Family Medicine

## 2015-05-03 DIAGNOSIS — E538 Deficiency of other specified B group vitamins: Secondary | ICD-10-CM

## 2015-05-03 MED ORDER — CYANOCOBALAMIN 1000 MCG/ML IJ SOLN
1000.0000 ug | Freq: Once | INTRAMUSCULAR | Status: AC
  Administered 2015-05-03: 1000 ug via INTRAMUSCULAR

## 2015-06-03 ENCOUNTER — Ambulatory Visit (INDEPENDENT_AMBULATORY_CARE_PROVIDER_SITE_OTHER): Payer: BC Managed Care – PPO | Admitting: Family Medicine

## 2015-06-03 DIAGNOSIS — E538 Deficiency of other specified B group vitamins: Secondary | ICD-10-CM | POA: Diagnosis not present

## 2015-06-03 MED ORDER — CYANOCOBALAMIN 1000 MCG/ML IJ SOLN
1000.0000 ug | Freq: Once | INTRAMUSCULAR | Status: AC
  Administered 2015-06-03: 1000 ug via INTRAMUSCULAR

## 2015-06-05 ENCOUNTER — Other Ambulatory Visit: Payer: Self-pay | Admitting: Endocrinology

## 2015-06-15 ENCOUNTER — Other Ambulatory Visit: Payer: Self-pay | Admitting: Endocrinology

## 2015-06-24 ENCOUNTER — Other Ambulatory Visit: Payer: BC Managed Care – PPO

## 2015-06-27 ENCOUNTER — Ambulatory Visit: Payer: BC Managed Care – PPO | Admitting: Endocrinology

## 2015-07-03 ENCOUNTER — Telehealth: Payer: Self-pay | Admitting: Endocrinology

## 2015-07-03 ENCOUNTER — Other Ambulatory Visit (INDEPENDENT_AMBULATORY_CARE_PROVIDER_SITE_OTHER): Payer: BC Managed Care – PPO

## 2015-07-03 ENCOUNTER — Other Ambulatory Visit: Payer: Self-pay | Admitting: *Deleted

## 2015-07-03 DIAGNOSIS — E1140 Type 2 diabetes mellitus with diabetic neuropathy, unspecified: Secondary | ICD-10-CM | POA: Diagnosis not present

## 2015-07-03 DIAGNOSIS — E1165 Type 2 diabetes mellitus with hyperglycemia: Secondary | ICD-10-CM

## 2015-07-03 DIAGNOSIS — IMO0002 Reserved for concepts with insufficient information to code with codable children: Secondary | ICD-10-CM

## 2015-07-03 LAB — COMPREHENSIVE METABOLIC PANEL
ALBUMIN: 4.2 g/dL (ref 3.5–5.2)
ALK PHOS: 68 U/L (ref 39–117)
ALT: 8 U/L (ref 0–35)
AST: 15 U/L (ref 0–37)
BUN: 18 mg/dL (ref 6–23)
CO2: 28 mEq/L (ref 19–32)
Calcium: 9.7 mg/dL (ref 8.4–10.5)
Chloride: 102 mEq/L (ref 96–112)
Creatinine, Ser: 1.11 mg/dL (ref 0.40–1.20)
GFR: 65.53 mL/min (ref >60.00)
Glucose, Bld: 118 mg/dL — ABNORMAL HIGH (ref 70–99)
POTASSIUM: 4.6 meq/L (ref 3.5–5.1)
Sodium: 138 mEq/L (ref 135–145)
TOTAL PROTEIN: 8.1 g/dL (ref 6.0–8.3)
Total Bilirubin: 0.5 mg/dL (ref 0.2–1.2)

## 2015-07-03 LAB — HEMOGLOBIN A1C: Hgb A1c MFr Bld: 6.8 % — ABNORMAL HIGH (ref 4.6–6.5)

## 2015-07-03 MED ORDER — EMPAGLIFLOZIN 25 MG PO TABS: 25.0000 mg | ORAL_TABLET | Freq: Every day | ORAL | Status: DC

## 2015-07-03 NOTE — Telephone Encounter (Signed)
rx sent

## 2015-07-03 NOTE — Telephone Encounter (Signed)
Patient would like her Rx sent to her pharmacy   Rx: [place]: [facility]   Thank you

## 2015-07-04 ENCOUNTER — Ambulatory Visit (INDEPENDENT_AMBULATORY_CARE_PROVIDER_SITE_OTHER): Payer: BC Managed Care – PPO | Admitting: *Deleted

## 2015-07-04 DIAGNOSIS — E538 Deficiency of other specified B group vitamins: Secondary | ICD-10-CM | POA: Diagnosis not present

## 2015-07-04 MED ORDER — CYANOCOBALAMIN 1000 MCG/ML IJ SOLN
1000.0000 ug | Freq: Once | INTRAMUSCULAR | Status: AC
  Administered 2015-07-04: 1000 ug via INTRAMUSCULAR

## 2015-07-08 ENCOUNTER — Encounter: Payer: Self-pay | Admitting: Endocrinology

## 2015-07-08 ENCOUNTER — Ambulatory Visit (INDEPENDENT_AMBULATORY_CARE_PROVIDER_SITE_OTHER): Payer: BC Managed Care – PPO | Admitting: Endocrinology

## 2015-07-08 VITALS — BP 154/86 | HR 86 | Temp 97.7°F | Resp 16 | Ht 65.0 in | Wt 152.8 lb

## 2015-07-08 DIAGNOSIS — E119 Type 2 diabetes mellitus without complications: Secondary | ICD-10-CM | POA: Diagnosis not present

## 2015-07-08 MED ORDER — EMPAGLIFLOZIN 25 MG PO TABS: 25.0000 mg | ORAL_TABLET | Freq: Every day | ORAL | Status: DC

## 2015-07-08 NOTE — Progress Notes (Signed)
Patient ID: Sherry Hart, female   DOB: [birth year], 55 y.o.   MRN: [id number]   Reason for Appointment : Followup    History of Present Illness          Problem 1: Type 2 diabetes mellitus, date of diagnosis: 2002       Past history: She had been treated with metformin since diagnosis and previous records are not available for review. She thinks that when she was taking her blood sugar regularly there were usually below 150 in the morning In 2012 because of lack of insurance she did not take her medications or check her sugar for about a year In 6/13 she was evaluated in the emergency room for abdominal pain and was found to have an A1c of 14% with very high blood sugars. Her only symptoms were weight loss. She was given Lantus, unknown dose for about 2 months but she went off this on her own since she did not want to continue this while going to work. Also was presumably given glipizide at that time but no details available She was seen for initial consultation in 1/15 and at that time her glucose was 431 with A1c of 13.1 Initially treated with insulin but this was later replaced with oral agents She started taking Kombiglyze XR on 08/08/13 and had gone up to 2 tablets daily Because of tendency to relatively higher fasting readings she was given low-dose Amaryl in 3/15 in addition  Recent history:  She is on a multiple drug regimen including Invokana which has helped her control Her A1c is stable at 6.8 Amaryl has been continued at a half tablet of 1 mg in the evenings along with Janumet XR  Current blood sugars at home following:  Fasting glucose was 118 in the lab but she has not done any readings for her 3-4 weeks  She does not remember her readings from before, usually does not check after meals as directed  Has been inconsistent with exercise because of cold weather and intercurrent illness and travel  Only rare symptoms of hypoglycemia, none recently  She thinks  she is getting some protein at every meal and trying to moderate her carbohydrate intake  Her weight has been about the same   Oral hypoglycemic drugs the patient is taking are:  Glimeperide 0.5 mg pcs, Janumet XR 100/1000 daily, Invokana 300 mg Side effects from medications have been:  GI side effects from 2000 mg metformin ER  Glucose monitoring:  done infrequently, mostly in the morning        Glucometer: Freestyle    Blood Glucose readings none in 1/17   Glycemic control:   Lab Results  Component Value Date   HGBA1C 6.8* 07/03/2015   HGBA1C 6.9* 03/21/2015   HGBA1C 9.3* 11/09/2014   Lab Results  Component Value Date   MICROALBUR 0.7 03/21/2015   LDLCALC 88 11/09/2014   CREATININE 1.11 07/03/2015   Self-care: The diet that the patient has been following is:  usually low fat, low carbs.  Eating egg in am, usually cereal if she is going to work Usually has fruit for snacks   Avoiding drinks with sugar, dinner is at 5-7 pm            Exercise: walks at times    Dietician visit: Most recent: 2002 attended a class               Compliance with the medical regimen: fair Retinal exam: Most recent:  12/13  Weight history: 125 upto 173, her lowest weight was in 11/2011  Wt Readings from Last 3 Encounters:  07/08/15 152 lb 12.8 oz (69.31 kg)  03/27/15 152 lb 12.8 oz (69.31 kg)  01/24/15 151 lb 3.2 oz (68.584 kg)   HYPERTENSION: Treatment reviewed in review of systems     Medication List       This list is accurate as of: 07/08/15  4:54 PM.  Always use your most recent med list.               aspirin 81 MG tablet  Take 81 mg by mouth daily.     atorvastatin 10 MG tablet  Commonly known as:  LIPITOR  TAKE 1 TABLET BY MOUTH DAILY     B-12 IJ  Inject as directed.     freestyle lancets  Use as instructed to check blood sugars 3 times per day     glimepiride 1 MG tablet  Commonly known as:  AMARYL  Take 2 tablets with supper until blood sugar is under 200 then  take 1 tablet daily     glucose blood test strip  Commonly known as:  ONE TOUCH ULTRA TEST  Use as instructed to check blood sugar 3 times per day dx code E11.65     INVOKANA 300 MG Tabs tablet  Generic drug:  canagliflozin  TAKE 1 TABLET BY MOUTH BEFORE BREAKFAST     JANUMET XR [phone] MG Tb24  Generic drug:  SitaGLIPtin-MetFORMIN HCl  TAKE 1 TABLET BY MOUTH EVERY DAY     losartan 100 MG tablet  Commonly known as:  COZAAR  TAKE 1 TABLET BY MOUTH DAILY     ONE TOUCH ULTRA 2 w/Device Kit  Use to check blood sugar 3 times per day dx code E11.65        Allergies:  Allergies  Allergen Reactions  . Penicillins Hives    Back of neck and upper back.    Past Medical History  Diagnosis Date  . Hypertension   . Diabetes mellitus 12 yrs ago   . Biliary colic   . Anemia   . Constipation   . Unexplained weight loss   . Abdominal pain   . Vomiting   . Weakness     Past Surgical History  Procedure Laterality Date  . Wisdom tooth extraction    . Cesarean section  11/29/1990, 04/06/1994  . Knee surgery  11/2000    right  . Mouth surgery  04/04/1987  . Colonoscopy  09/01/2004    Family History  Problem Relation Age of Onset  . Cancer Mother     breast  . Diabetes Father   . Thyroid disease Sister   . Hypertension Neg Hx     Social History:  reports that she has never smoked. She has never used smokeless tobacco. She reports that she does not drink alcohol or use illicit drugs.    Review of Systems     HYPERTENSION: blood pressure has been treated with Cozaar 112m   Her blood pressure tends to be higher in the office, she thinks her blood pressure is generally well controlled at home   BP at home not checked for some time and has not followed up with PCP  Lipids:  She has been taking Lipitor that was prescribed in 12/2013 and her LDL is at target HDL below 40   Lab Results  Component Value Date   CHOL 136 11/09/2014   HDL 39.50 11/09/2014  [place] 88  11/09/2014   LDLDIRECT 175.6 07/28/2013   TRIG 44.0 11/09/2014   CHOLHDL 3 11/09/2014    Diabetic foot exam done in 08/2014 normal except for Decreased distal monofilament sensation on the toes and plantar surfaces  She has vitamin B-12 deficiency and is on injectable treatment monthly from PCP  Physical Examination:  BP 154/86 mmHg  Pulse 86  Temp(Src) 97.7 F (36.5 C)  Resp 16  Ht _0  (1.651 m)  Wt 152 lb 12.8 oz (69.31 kg)  BMI 25.43 kg/m2  SpO2 93%  Repeat blood pressure 160/92   ASSESSMENT/PLAN:   Diabetes type 2 See history of present illness for detailed discussion of his current management, blood sugar patterns and problems identified   Her blood sugars are fairly well controlled with quadruple therapy using Janumet XR, 300 mg Invokana and low dose Amaryl She has not monitored her blood sugars and discussed need for restarting Her insurance wants her to take Jardiance instead of Invokana and will make sure this is as effective  Reminded her to be regular with walking and call if blood sugars are not consistent  Hypertension: Blood pressure is still relatively high She will follow-up with PCP and also monitors periodically at home    Patient Instructions  Check BP weekly     Sherry Hart 07/08/2015, 4:54 PM   Note: This office note was prepared with Dragon voice recognition system technology. Any transcriptional errors that result from this process are unintentional.

## 2015-07-08 NOTE — Patient Instructions (Signed)
Check BP weekly 

## 2015-08-05 ENCOUNTER — Ambulatory Visit (INDEPENDENT_AMBULATORY_CARE_PROVIDER_SITE_OTHER): Payer: BC Managed Care – PPO | Admitting: Family Medicine

## 2015-08-05 ENCOUNTER — Other Ambulatory Visit: Payer: Self-pay

## 2015-08-05 DIAGNOSIS — Z1231 Encounter for screening mammogram for malignant neoplasm of breast: Secondary | ICD-10-CM

## 2015-08-05 DIAGNOSIS — E538 Deficiency of other specified B group vitamins: Secondary | ICD-10-CM | POA: Diagnosis not present

## 2015-08-05 MED ORDER — CYANOCOBALAMIN 1000 MCG/ML IJ SOLN
1000.0000 ug | INTRAMUSCULAR | Status: DC
  Administered 2015-08-05 – 2015-10-16 (×2): 1000 ug via INTRAMUSCULAR

## 2015-08-16 ENCOUNTER — Ambulatory Visit
Admission: RE | Admit: 2015-08-16 | Discharge: 2015-08-16 | Disposition: A | Discharge status: Home / Self Care | Payer: BC Managed Care – PPO | Source: Ambulatory Visit

## 2015-08-16 DIAGNOSIS — Z1231 Encounter for screening mammogram for malignant neoplasm of breast: Secondary | ICD-10-CM

## 2015-08-21 ENCOUNTER — Ambulatory Visit (INDEPENDENT_AMBULATORY_CARE_PROVIDER_SITE_OTHER): Payer: BC Managed Care – PPO | Admitting: Family Medicine

## 2015-08-21 ENCOUNTER — Encounter: Payer: Self-pay | Admitting: Family Medicine

## 2015-08-21 VITALS — BP 136/72 | HR 72 | Temp 99.0°F | Resp 14 | Ht 65.0 in | Wt 148.0 lb

## 2015-08-21 DIAGNOSIS — Z0000 Encounter for general adult medical examination without abnormal findings: Secondary | ICD-10-CM

## 2015-08-21 DIAGNOSIS — Z1211 Encounter for screening for malignant neoplasm of colon: Secondary | ICD-10-CM | POA: Diagnosis not present

## 2015-08-21 DIAGNOSIS — Z124 Encounter for screening for malignant neoplasm of cervix: Secondary | ICD-10-CM | POA: Diagnosis not present

## 2015-08-21 DIAGNOSIS — I10 Essential (primary) hypertension: Secondary | ICD-10-CM | POA: Diagnosis not present

## 2015-08-21 NOTE — Assessment & Plan Note (Signed)
She's had some elevated blood pressures at the endocrinology office. Her blood pressure today looks very good. She will monitor at home

## 2015-08-21 NOTE — Progress Notes (Signed)
Patient ID: Sherry Hart, female   DOB: Aug 17, 1960, 55 y.o.   MRN: [id number]   Subjective:    Patient ID: Sherry Hart, female    DOB: 1960-07-07, 55 y.o.   MRN: [id number]  Patient presents for Annual Exam   She here for annual GYN exam. She has no specific concerns today. She is followed by her endocrinologist for diabetes mellitus he also checks her lipids and her blood pressure. Her medications were reviewed. 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 Her Pap smear last year did show ASCUS but no HPV was detected. She is due today for repeat Pap smear. She is also due for her colonoscopy. Her mammogram is up-to-date her immunizations are up-to-date    Review Of Systems:  GEN- denies fatigue, fever, weight loss,weakness, recent illness HEENT- denies eye drainage, change in vision, nasal discharge, CVS- denies chest pain, palpitations RESP- denies SOB, cough, wheeze ABD- denies N/V, change in stools, abd pain GU- denies dysuria, hematuria, dribbling, incontinence MSK- denies joint pain, muscle aches, injury Neuro- denies headache, dizziness, syncope, seizure activity       Objective:    BP 136/72 mmHg  Pulse 72  Temp(Src) 99 F (37.2 C) (Oral)  Resp 14  Ht 5\' 5"  (1.651 m)  Wt 148 lb (67.132 kg)  BMI 24.63 kg/m2 GEN- NAD, alert and oriented x3 HEENT- PERRL, EOMI, non injected sclera, pink conjunctiva, MMM, oropharynx clear Neck- Supple, no thyromegaly Breast- normal symmetry, no nipple inversion,no nipple drainage, no nodules or lumps felt Nodes- no axillary nodes CVS- RRR, no murmur RESP-CTAB ABD-NABS,soft,NT,ND GU- normal external genitalia, vaginal mucosa pink and moist, cervix visualized no growth, no blood form os, No discharge, no CMT, no ovarian masses, uterus normal size Rectum- normal tone FOBT neg  EXT- No edema Pulses- Radial, DP- 2+        Assessment & Plan:      Problem List Items Addressed This Visit    Essential  hypertension, benign     She's had some elevated blood pressures at the endocrinology office. Her blood pressure today looks very good. She will monitor at home       Other Visit Diagnoses    Routine general medical examination at a health care facility    -  Primary    CPE  done, Colonoscopy to be done, if PAP normal repeat in 3 years     Cervical cancer screening        Relevant Orders    PAP, ThinPrep ASCUS Rflx HPV Rflx Type    Colon cancer screening        Relevant Orders    Ambulatory referral to Gastroenterology       Note: This dictation was prepared with Dragon dictation along with smaller phrase technology. Any transcriptional errors that result from this process are unintentional.

## 2015-08-21 NOTE — Patient Instructions (Addendum)
Referral for colonoscopy We will call with PAP Smear results  F/U as needed

## 2015-08-22 LAB — PAP THINPREP ASCUS RFLX HPV RFLX TYPE

## 2015-08-23 ENCOUNTER — Other Ambulatory Visit: Payer: Self-pay | Admitting: Endocrinology

## 2015-08-23 ENCOUNTER — Encounter: Payer: Self-pay | Admitting: Family Medicine

## 2015-08-26 ENCOUNTER — Other Ambulatory Visit: Payer: Self-pay | Admitting: *Deleted

## 2015-08-26 MED ORDER — GLIMEPIRIDE 1 MG PO TABS: ORAL_TABLET | ORAL | Status: DC

## 2015-09-03 ENCOUNTER — Ambulatory Visit (INDEPENDENT_AMBULATORY_CARE_PROVIDER_SITE_OTHER): Payer: BC Managed Care – PPO | Admitting: Family Medicine

## 2015-09-03 DIAGNOSIS — E538 Deficiency of other specified B group vitamins: Secondary | ICD-10-CM | POA: Diagnosis not present

## 2015-09-16 ENCOUNTER — Other Ambulatory Visit: Payer: Self-pay | Admitting: *Deleted

## 2015-09-16 MED ORDER — LOSARTAN POTASSIUM 100 MG PO TABS: 100.0000 mg | ORAL_TABLET | Freq: Every day | ORAL | Status: DC

## 2015-09-20 ENCOUNTER — Other Ambulatory Visit: Payer: Self-pay | Admitting: Endocrinology

## 2015-09-24 ENCOUNTER — Other Ambulatory Visit (INDEPENDENT_AMBULATORY_CARE_PROVIDER_SITE_OTHER): Payer: BC Managed Care – PPO

## 2015-09-24 DIAGNOSIS — E119 Type 2 diabetes mellitus without complications: Secondary | ICD-10-CM

## 2015-09-24 LAB — BASIC METABOLIC PANEL
BUN: 19 mg/dL (ref 6–23)
CHLORIDE: 105 meq/L (ref 96–112)
CO2: 30 meq/L (ref 19–32)
Calcium: 10 mg/dL (ref 8.4–10.5)
Creatinine, Ser: 1.19 mg/dL (ref 0.40–1.20)
GFR: 60.42 mL/min (ref >60.00)
Glucose, Bld: 165 mg/dL — ABNORMAL HIGH (ref 70–99)
POTASSIUM: 4.8 meq/L (ref 3.5–5.1)
SODIUM: 141 meq/L (ref 135–145)

## 2015-09-24 LAB — HEMOGLOBIN A1C: HEMOGLOBIN A1C: 7.7 % — AB (ref 4.6–6.5)

## 2015-09-25 ENCOUNTER — Encounter: Payer: Self-pay | Admitting: Gastroenterology

## 2015-09-25 ENCOUNTER — Other Ambulatory Visit: Payer: Self-pay | Admitting: Endocrinology

## 2015-10-02 ENCOUNTER — Other Ambulatory Visit: Payer: Self-pay | Admitting: Endocrinology

## 2015-10-04 ENCOUNTER — Ambulatory Visit: Payer: BC Managed Care – PPO | Admitting: Endocrinology

## 2015-10-16 ENCOUNTER — Ambulatory Visit (INDEPENDENT_AMBULATORY_CARE_PROVIDER_SITE_OTHER): Payer: BC Managed Care – PPO | Admitting: Family Medicine

## 2015-10-16 DIAGNOSIS — E538 Deficiency of other specified B group vitamins: Secondary | ICD-10-CM

## 2015-10-17 LAB — HM DIABETES EYE EXAM

## 2015-10-21 ENCOUNTER — Encounter: Payer: Self-pay | Admitting: Endocrinology

## 2015-10-23 ENCOUNTER — Ambulatory Visit (INDEPENDENT_AMBULATORY_CARE_PROVIDER_SITE_OTHER): Payer: BC Managed Care – PPO | Admitting: Endocrinology

## 2015-10-23 ENCOUNTER — Encounter: Payer: Self-pay | Admitting: Endocrinology

## 2015-10-23 ENCOUNTER — Other Ambulatory Visit: Payer: Self-pay | Admitting: *Deleted

## 2015-10-23 VITALS — BP 124/82 | HR 85 | Temp 98.0°F | Resp 14 | Ht 65.0 in | Wt 151.8 lb

## 2015-10-23 DIAGNOSIS — E1165 Type 2 diabetes mellitus with hyperglycemia: Secondary | ICD-10-CM

## 2015-10-23 MED ORDER — ACCU-CHEK MULTICLIX LANCET DEV KIT: PACK | Status: DC

## 2015-10-23 NOTE — Patient Instructions (Addendum)
Check blood sugars on waking up 3  times a week  Also check blood sugars about 2 hours after a meal and do this after different meals by rotation  Recommended blood sugar levels on waking up is 90-130 and about 2 hours after meal is 130-160  Please bring your blood sugar monitor to each visit, thank you  Take Jardiance at dinner  Glimeperide 1/2 at dinner  Reduce Carbs and sweets

## 2015-10-23 NOTE — Progress Notes (Signed)
Patient ID: Sherry Hart, female   DOB: 15-Feb-1961, 55 y.o.   MRN: [id number]   Reason for Appointment : Followup of diabetes   History of Present Illness          Problem 1: Type 2 diabetes mellitus, date of diagnosis: 2002       Past history: She had been treated with metformin since diagnosis and previous records are not available for review. She thinks that when she was taking her blood sugar regularly there were usually below 150 in the morning In 2012 because of lack of insurance she did not take her medications or check her sugar for about a year In 6/13 she was evaluated in the emergency room for abdominal pain and was found to have an A1c of 14% with very high blood sugars. Her only symptoms were weight loss. She was given Lantus, unknown dose for about 2 months but she went off this on her own since she did not want to continue this while going to work. Also was presumably given glipizide at that time but no details available She was seen for initial consultation in 1/15 and at that time her glucose was 431 with A1c of 13.1 Initially treated with insulin but this was later replaced with oral agents She started taking Kombiglyze XR on 08/08/13 and had gone up to 2 tablets daily Because of tendency to relatively higher fasting readings she was given low-dose Amaryl in 3/15 in addition  Recent history:   Her blood sugars are not as well controlled with A1c going up to 7.7, previously below 7 She did not bring her monitor for download today  Current blood sugars at home following:  Fasting glucose appears to be high consistently even with her taking the full 1 mg tablet of Amaryl at suppertime  She thinks some of the high readings may be because of her not having the co-pay card for Jardiance and Janumet at some point for a couple of weeks  Not clear if she is having high readings after supper as she does not check blood sugars after evening meal  She thinks she is  getting more carbohydrates like sweet potatoes and some sweets at times  Her insurance preference was Jardiance instead of Invokana and not clear if blood sugars are as good with this  She usually not eating breakfast and sometimes will have only a banana or a rice cake.  Periodically will feel hypoglycemic late morning or before supper time especially if not eating full meals with protein  Her weight has been about the same  She finds the fingerpicking painful with her current devices and is reluctant to check her blood sugars more often   Oral hypoglycemic drugs the patient is taking are:  Glimeperide 1 mg acs, Janumet XR 100/1000 daily, Jardiance   Side effects from medications have been:  GI side effects from 2000 mg metformin ER  Glucose monitoring:  done infrequently, mostly in the morning        Glucometer: Freestyle    Blood Glucose readings by recall:  Mean values apply above for all meters except median for One Touch  PRE-MEAL Fasting Lunch Dinner Bedtime Overall  Glucose range: 140-161  120s ?   Mean/median:         Glycemic control:   Lab Results  Component Value Date   HGBA1C 7.7* 09/24/2015   HGBA1C 6.8* 07/03/2015   HGBA1C 6.9* 03/21/2015   Lab Results  Component Value Date  MICROALBUR 0.7 03/21/2015   LDLCALC 88 11/09/2014   CREATININE 1.19 09/24/2015   Self-care: The diet that the patient has been following is:  usually low fat, low carbs, Variable.  Breakfast may be rice cake, occasionally cereal or peanut butter crackers Usually has fruit for snacks   Avoiding drinks with sugar, dinner is at 5-7 pm            Exercise: walks in pms, irregular, going out less because of all in    Dietician visit: Most recent: 2002 attended a class               Compliance with the medical regimen: fair Retinal exam: Most recent: 12/13  Weight history: 125 upto 173, her lowest weight was in 11/2011  Wt Readings from Last 3 Encounters:  10/23/15 151 lb 12.8 oz  (68.856 kg)  08/21/15 148 lb (67.132 kg)  07/08/15 152 lb 12.8 oz (69.31 kg)    HYPERTENSION: Treatment reviewed in review of systems     Medication List       This list is accurate as of: 10/23/15  4:56 PM.  Always use your most recent med list.               aspirin 81 MG tablet  Take 81 mg by mouth daily.     atorvastatin 10 MG tablet  Commonly known as:  LIPITOR  TAKE 1 TABLET BY MOUTH DAILY     empagliflozin 25 MG Tabs tablet  Commonly known as:  JARDIANCE  Take 25 mg by mouth daily.     freestyle lancets  Use as instructed to check blood sugars 3 times per day     glimepiride 1 MG tablet  Commonly known as:  AMARYL  TAKE 1 TABLET WITH SUPPER EVERY DAY     glucose blood test strip  Commonly known as:  ONE TOUCH ULTRA TEST  Use as instructed to check blood sugar 3 times per day dx code E11.65     JANUMET XR [phone] MG Tb24  Generic drug:  SitaGLIPtin-MetFORMIN HCl  TAKE 1 TABLET BY MOUTH EVERY DAY     losartan 100 MG tablet  Commonly known as:  COZAAR  Take 1 tablet (100 mg total) by mouth daily.     ONE TOUCH ULTRA 2 w/Device Kit  Use to check blood sugar 3 times per day dx code E11.65        Allergies:  Allergies  Allergen Reactions  . Penicillins Hives    Back of neck and upper back.    Past Medical History  Diagnosis Date  . Hypertension   . Diabetes mellitus 12 yrs ago   . Biliary colic   . Anemia   . Constipation   . Unexplained weight loss   . Abdominal pain   . Vomiting   . Weakness     Past Surgical History  Procedure Laterality Date  . Wisdom tooth extraction    . Cesarean section  11/29/1990, 04/06/1994  . Knee surgery  11/2000    right  . Mouth surgery  04/04/1987  . Colonoscopy  09/01/2004    Family History  Problem Relation Age of Onset  . Cancer Mother     breast  . Diabetes Father   . Thyroid disease Sister   . Hypertension Neg Hx     Social History:  reports that she has never smoked. She has never used  smokeless tobacco. She reports that she does not drink alcohol or  use illicit drugs.    Review of Systems     HYPERTENSION: blood pressure has been treated with Cozaar 141m   Her blood pressure tends to be higher in the office, she thinks her blood pressure is generally well controlled at home    Lipids:  She has been taking Lipitor that was prescribed in 12/2013 and her LDL is at target HDL below 40   Lab Results  Component Value Date   CHOL 136 11/09/2014   HDL 39.50 11/09/2014   LDLCALC 88 11/09/2014   LDLDIRECT 175.6 07/28/2013   TRIG 44.0 11/09/2014   CHOLHDL 3 11/09/2014    Diabetic foot exam done in 08/2014 normal except for Decreased distal monofilament sensation on the toes and plantar surfaces  Eye exam was normal in 3/17  She has vitamin B-12 deficiency and is on injectable treatment monthly from PCP  Physical Examination:  BP 124/82 mmHg  Pulse 85  Temp(Src) 98 F (36.7 C)  Resp 14  Ht '5\' 5"'  (1.651 m)  Wt 151 lb 12.8 oz (68.856 kg)  BMI 25.26 kg/m2  SpO2 97%    ASSESSMENT/PLAN:   Diabetes type 2 See history of present illness for detailed discussion of his current management, blood sugar patterns and problems identified Blood sugars have gone up with A1c being higher Not clear if this is related to switching from Invokana to J[employer] ShawAlso she thinks she had been out of her medications for a couple of weeks because of prescription or co-pay card issues Now she is taking 1 mg of Amaryl and this appears to be causing some tendency to hypoglycemia during the day especially when she is eating small meals are related to protein especially breakfast  Discussed day-to-day management for diabetes For now will try to switch her Jardiance to suppertime to see if it will help her overnight hypoglycemia She does need to check blood sugars more often after supper which she is not doing She can try Accu-Chek multiclick device for less painful glucose testing Also  reduce Amaryl to half tablet to avoid hypoglycemia during the day No consistent exercise We will refer her to the dietitian and she will also check to see if this is covered  Hypertension: Blood pressure is controlled  Patient Instructions  Check blood sugars on waking up 3  times a week  Also check blood sugars about 2 hours after a meal and do this after different meals by rotation  Recommended blood sugar levels on waking up is 90-130 and about 2 hours after meal is 130-160  Please bring your blood sugar monitor to each visit, thank you  Take Jardiance at dinner  Glimeperide 1/2 at dinner  Reduce Carbs and sweets    Counseling time on subjects discussed above is over 50% of today's 25 minute visit    Dafina Suk 10/23/2015, 4:56 PM   Note: This office note was prepared with [place] agent Any transcriptional errors that result from this process are unintentional.

## 2015-10-28 ENCOUNTER — Other Ambulatory Visit: Payer: Self-pay | Admitting: *Deleted

## 2015-10-28 MED ORDER — ACCU-CHEK FASTCLIX LANCET KIT: PACK | Status: DC

## 2015-11-18 ENCOUNTER — Ambulatory Visit (INDEPENDENT_AMBULATORY_CARE_PROVIDER_SITE_OTHER): Payer: BC Managed Care – PPO | Admitting: *Deleted

## 2015-11-18 DIAGNOSIS — E538 Deficiency of other specified B group vitamins: Secondary | ICD-10-CM | POA: Diagnosis not present

## 2015-11-18 MED ORDER — CYANOCOBALAMIN 1000 MCG/ML IJ SOLN
1000.0000 ug | Freq: Once | INTRAMUSCULAR | Status: AC
  Administered 2015-11-18: 1000 ug via INTRAMUSCULAR

## 2015-11-18 NOTE — Progress Notes (Signed)
Patient ID: Sherry Hart, female   DOB: 1960/08/03, 55 y.o.   MRN: [id number]  Patient seen in office for Vitamin B 12 injection.   Tolerated IM administration well.

## 2015-11-25 ENCOUNTER — Ambulatory Visit (AMBULATORY_SURGERY_CENTER): Payer: Self-pay | Admitting: *Deleted

## 2015-11-25 VITALS — Ht 65.0 in | Wt 149.0 lb

## 2015-11-25 DIAGNOSIS — Z1211 Encounter for screening for malignant neoplasm of colon: Secondary | ICD-10-CM

## 2015-11-25 NOTE — Progress Notes (Signed)
No egg or soy allergy known to patient  No issues with past sedation with any surgeries  or procedures, no intubation problems  No diet pills per patient No home 02 use per patient  No blood thinners per patient  Pt denies issues with constipation  emmi declined

## 2015-11-26 ENCOUNTER — Encounter: Payer: Self-pay | Admitting: Gastroenterology

## 2015-12-04 ENCOUNTER — Other Ambulatory Visit (INDEPENDENT_AMBULATORY_CARE_PROVIDER_SITE_OTHER): Payer: BC Managed Care – PPO

## 2015-12-04 DIAGNOSIS — E1165 Type 2 diabetes mellitus with hyperglycemia: Secondary | ICD-10-CM | POA: Diagnosis not present

## 2015-12-04 LAB — BASIC METABOLIC PANEL
BUN: 20 mg/dL (ref 6–23)
CHLORIDE: 104 meq/L (ref 96–112)
CO2: 28 meq/L (ref 19–32)
Calcium: 9.9 mg/dL (ref 8.4–10.5)
Creatinine, Ser: 1.06 mg/dL (ref 0.40–1.20)
GFR: 69 mL/min (ref >60.00)
GLUCOSE: 98 mg/dL (ref 70–99)
POTASSIUM: 4.3 meq/L (ref 3.5–5.1)
Sodium: 139 mEq/L (ref 135–145)

## 2015-12-05 LAB — FRUCTOSAMINE: FRUCTOSAMINE: 289 umol/L — AB (ref 0–285)

## 2015-12-06 ENCOUNTER — Ambulatory Visit (INDEPENDENT_AMBULATORY_CARE_PROVIDER_SITE_OTHER): Payer: BC Managed Care – PPO | Admitting: Endocrinology

## 2015-12-06 ENCOUNTER — Encounter: Payer: Self-pay | Admitting: Endocrinology

## 2015-12-06 VITALS — BP 132/80 | HR 82 | Ht 65.0 in | Wt 149.0 lb

## 2015-12-06 DIAGNOSIS — E1165 Type 2 diabetes mellitus with hyperglycemia: Secondary | ICD-10-CM

## 2015-12-06 NOTE — Patient Instructions (Signed)
Check blood sugars on waking up 3  times a week  Also check blood sugars about 2 hours after a meal and do this after different meals by rotation  Recommended blood sugar levels on waking up is 90-130 and about 2 hours after meal is 130-160  Please bring your blood sugar monitor to each visit, thank you  

## 2015-12-06 NOTE — Progress Notes (Signed)
Patient ID: Sherry Hart, female   DOB: 12/09/1960, 55 y.o.   MRN: [id number]   Reason for Appointment : Followup of diabetes   History of Present Illness          Problem 1: Type 2 diabetes mellitus, date of diagnosis: 2002       Past history: She had been treated with metformin since diagnosis and previous records are not available for review. She thinks that when she was taking her blood sugar regularly there were usually below 150 in the morning In 2012 because of lack of insurance she did not take her medications or check her sugar for about a year In 6/13 she was evaluated in the emergency room for abdominal pain and was found to have an A1c of 14% with very high blood sugars. Her only symptoms were weight loss. She was given Lantus, unknown dose for about 2 months but she went off this on her own since she did not want to continue this while going to work. Also was presumably given glipizide at that time but no details available She was seen for initial consultation in 1/15 and at that time her glucose was 431 with A1c of 13.1 Initially treated with insulin but this was later replaced with oral agents She started taking Kombiglyze XR on 08/08/13 and had gone up to 2 tablets daily Because of tendency to relatively higher fasting readings she was given low-dose Amaryl in 3/15 in addition  Recent history:   Her blood sugars were not as well controlled previously, A1c going up to 7.7, previously below 7 Her Jardiance was moved to the evening instead of morning and Amaryl was reduced to half a tablet to reduce tendency to hypoglycemia during the day FRUCTOSAMINE is mildly increased at 289  She did bring her monitor for download today  Current blood sugars at home following:  Fasting glucose appears to be overall fairly close to normal now with changing the timing of her Jardiance medication  She also has started watching her diet especially with carbohydrates more  consistently, eating more vegetables and less foods like potatoes  Only recently has started walking in the park for 45 minutes  Her weight is down  She still has only a few blood sugars and only once after evening meal  No hypoglycemia, she thinks he feels shaky of the blood sugars are 90 or below   Oral hypoglycemic drugs the patient is taking are:  Glimeperide 0.5 mg acs, Janumet XR 100/1000 daily, Jardiance   Side effects from medications have been:  GI side effects from 2000 mg metformin ER  Glucose monitoring:  done infrequently, mostly in the morning        Glucometer: Freestyle    Blood Glucose readings by   Mean values apply above for all meters except median for One Touch  PRE-MEAL Fasting Lunch Dinner Bedtime Overall  Glucose range: 109-131  85, 116   93    Mean/median:     108     Glycemic control:   Lab Results  Component Value Date   HGBA1C 7.7* 09/24/2015   HGBA1C 6.8* 07/03/2015   HGBA1C 6.9* 03/21/2015   Lab Results  Component Value Date   MICROALBUR 0.7 03/21/2015   LDLCALC 88 11/09/2014   CREATININE 1.06 12/04/2015   Self-care: The diet that the patient has been following is:  usually low fat, low carbs, Variable.  Breakfast may be rice cake, occasionally cereal or peanut butter crackers  Usually has fruit for snacks   Avoiding drinks with sugar, dinner is at 5-7 pm            Exercise: walks in [place]     Dietician visit: Most recent: 2002 attended a class               Compliance with the medical regimen: fair Retinal exam: Most recent: 12/13  Weight history: 125 upto 173, her lowest weight was in 11/2011  Wt Readings from Last 3 Encounters:  12/06/15 149 lb (67.586 kg)  11/25/15 149 lb (67.586 kg)  10/23/15 151 lb 12.8 oz (68.856 kg)   Lab on 12/04/2015  Component Date Value Ref Range Status  . Sodium 12/04/2015 139  135 - 145 mEq/L Final  . Potassium 12/04/2015 4.3  3.5 - 5.1 mEq/L Final  . Chloride 12/04/2015 104  96 - 112 mEq/L Final  .  CO2 12/04/2015 28  19 - 32 mEq/L Final  . Glucose, Bld 12/04/2015 98  70 - 99 mg/dL Final  . BUN 12/04/2015 20  6 - 23 mg/dL Final  . Creatinine, Ser 12/04/2015 1.06  0.40 - 1.20 mg/dL Final  . Calcium 12/04/2015 9.9  8.4 - 10.5 mg/dL Final  . GFR 12/04/2015 69.00  >60.00 mL/min Final  . Fructosamine 12/04/2015 289* 0 - 285 umol/L Final   Comment: Published reference interval for apparently healthy subjects between age 47 and 70 is 60 - 285 umol/L and in a poorly controlled diabetic population is 228 - 563 umol/L with a mean of 396 umol/L.      HYPERTENSION: Treatment reviewed in review of systems     Medication List       This list is accurate as of: 12/06/15  9:59 AM.  Always use your most recent med list.               ACCU-CHEK FASTCLIX LANCET Kit  Use to check blood sugar 3 times per day dx code E11.65     aspirin 81 MG tablet  Take 81 mg by mouth daily.     atorvastatin 10 MG tablet  Commonly known as:  LIPITOR  TAKE 1 TABLET BY MOUTH DAILY     bisacodyl 5 MG EC tablet  Commonly known as:  DULCOLAX  Take 5 mg by mouth once. X 4 for colon 6-26     Cyanocobalamin 1000 MCG/ML Kit  Inject 1,000 mg as directed every 30 (thirty) days.     empagliflozin 25 MG Tabs tablet  Commonly known as:  JARDIANCE  Take 25 mg by mouth daily.     freestyle lancets  Use as instructed to check blood sugars 3 times per day     glimepiride 1 MG tablet  Commonly known as:  AMARYL  TAKE 1 TABLET WITH SUPPER EVERY DAY     glucose blood test strip  Commonly known as:  ONE TOUCH ULTRA TEST  Use as instructed to check blood sugar 3 times per day dx code E11.65     JANUMET XR [phone] MG Tb24  Generic drug:  SitaGLIPtin-MetFORMIN HCl  TAKE 1 TABLET BY MOUTH EVERY DAY     losartan 100 MG tablet  Commonly known as:  COZAAR  Take 1 tablet (100 mg total) by mouth daily.     MIRALAX powder  Generic drug:  polyethylene glycol powder  Take 1 Container by mouth once. 238 grams for  colon 6-26     ONE TOUCH ULTRA 2 w/Device Kit  Use  to check blood sugar 3 times per day dx code E11.65        Allergies:  Allergies  Allergen Reactions  . Penicillins Hives    Back of neck and upper back.    Past Medical History  Diagnosis Date  . Hypertension   . Diabetes mellitus 12 yrs ago   . Biliary colic   . Anemia   . Constipation     occasionally not chronic per pt. -no medicines for this   . Unexplained weight loss   . Abdominal pain   . Vomiting   . Weakness   . Hyperlipidemia     Past Surgical History  Procedure Laterality Date  . Wisdom tooth extraction    . Cesarean section  11/29/1990, 04/06/1994  . Knee surgery  11/2000    right  . Mouth surgery  04/04/1987  . Upper gastrointestinal endoscopy  09-01-04    gastric polyp- m.johnson- in epic  . Colonoscopy  09/01/2004    all normal - in epic    Family History  Problem Relation Age of Onset  . Cancer Mother     breast  . Breast cancer Mother   . Diabetes Father   . Thyroid disease Sister   . Hypertension Neg Hx   . Colon cancer Neg Hx   . Colon polyps Neg Hx   . Rectal cancer Neg Hx   . Stomach cancer Neg Hx     Social History:  reports that she has never smoked. She has never used smokeless tobacco. She reports that she does not drink alcohol or use illicit drugs.    Review of Systems     HYPERTENSION: blood pressure has been treated with Cozaar 166m   Her blood pressure tends to be higher in the office, Today appears better Has not checked at home recently   Lipids:  She has been taking Lipitor that was prescribed in 12/2013 and her LDL is at target HDL  40   Lab Results  Component Value Date   CHOL 136 11/09/2014   HDL 39.50 11/09/2014   LDLCALC 88 11/09/2014   LDLDIRECT 175.6 07/28/2013   TRIG 44.0 11/09/2014   CHOLHDL 3 11/09/2014    Diabetic foot exam done in 08/2014 normal except for Decreased distal monofilament sensation on the toes and plantar surfaces  Eye exam was  normal in 3/17  She has vitamin B-12 deficiency and is on injectable treatment monthly from PCP  Physical Examination:  BP 132/80 mmHg  Pulse 82  Ht _0  (1.651 m)  Wt 149 lb (67.586 kg)  BMI 24.79 kg/m2  SpO2 96%  Diabetic Foot Exam - Simple   Simple Foot Form  Diabetic Foot exam was performed with the following findings:  Yes 12/06/2015  9:13 AM  Visual Inspection  No deformities, no ulcerations, no other skin breakdown bilaterally:  Yes  Sensation Testing  Intact to touch and monofilament testing bilaterally:  Yes  Pulse Check  Posterior Tibialis and Dorsalis pulse intact bilaterally:  Yes  Comments       ASSESSMENT/PLAN:   Diabetes type 2 See history of present illness for detailed discussion of his current management, blood sugar patterns and problems identified Blood sugars have Improved since her last visit with her taking all her diabetes medicines at suppertime and especially moving the Jardiance to the evening Fasting readings are better Also she thinks that she is trying to do better with cutting back on carbohydrates, increasing vegetables and some protein at  each meal Recently has started walking Has started losing a little weight also  She will continue same regimen and come back for A1c in 3 months  Hypertension: Blood pressure is controlled  Patient Instructions  Check blood sugars on waking up 3  times a week Also check blood sugars about 2 hours after a meal and do this after different meals by rotation  Recommended blood sugar levels on waking up is 90-130 and about 2 hours after meal is 130-160  Please bring your blood sugar monitor to each visit, thank you      [facility] 12/06/2015, 9:59 AM   Note: This office note was prepared with Dragon voice recognition system technology. Any transcriptional errors that result from this process are unintentional.

## 2015-12-09 ENCOUNTER — Encounter: Payer: Self-pay | Admitting: Gastroenterology

## 2015-12-09 ENCOUNTER — Ambulatory Visit (AMBULATORY_SURGERY_CENTER): Payer: BC Managed Care – PPO | Admitting: Gastroenterology

## 2015-12-09 VITALS — BP 142/87 | HR 88 | Temp 96.2°F | Resp 13 | Ht 60.0 in | Wt 149.0 lb

## 2015-12-09 DIAGNOSIS — Z1211 Encounter for screening for malignant neoplasm of colon: Secondary | ICD-10-CM | POA: Diagnosis present

## 2015-12-09 DIAGNOSIS — D123 Benign neoplasm of transverse colon: Secondary | ICD-10-CM

## 2015-12-09 MED ORDER — SODIUM CHLORIDE 0.9 % IV SOLN: 500.0000 mL | INTRAVENOUS | Status: DC

## 2015-12-09 NOTE — Op Note (Signed)
[facility] Patient Name: Sherry Hart Procedure Date: 12/09/2015 8:49 AM MRN: [id number] Endoscopist: [place]. Loletha Carrow , MD Age: 55 Referring MD:  Date of Birth: April 07, 1961 Gender: Female Account #: [id number] Procedure:                Colonoscopy Indications:              Screening for colorectal malignant neoplasm, This                            is the patient's first colonoscopy Medicines:                Monitored Anesthesia Care Procedure:                Pre-Anesthesia Assessment:                           - Prior to the procedure, a History and Physical                            was performed, and patient medications and                            allergies were reviewed. The patient's tolerance of                            previous anesthesia was also reviewed. The risks                            and benefits of the procedure and the sedation                            options and risks were discussed with the patient.                            All questions were answered, and informed consent                            was obtained. Prior Anticoagulants: The patient has                            taken no previous anticoagulant or antiplatelet                            agents. ASA Grade Assessment: II - A patient with                            mild systemic disease. After reviewing the risks                            and benefits, the patient was deemed in                            satisfactory condition to undergo the procedure.  After obtaining informed consent, the colonoscope                            was passed under direct vision. Throughout the                            procedure, the patient's blood pressure, pulse, and                            oxygen saturations were monitored continuously. The                            Model CF-HQ190L [id number]) scope was introduced                            through the anus and  advanced to the the cecum,                            identified by appendiceal orifice and ileocecal                            valve. The colonoscopy was performed without                            difficulty. The patient tolerated the procedure                            well. The quality of the bowel preparation was                            excellent. The ileocecal valve, appendiceal                            orifice, and rectum were photographed. The bowel                            preparation used was Miralax. Scope In: 9:04:15 AM Scope Out: 9:18:55 AM Scope Withdrawal Time: 0 hours 11 minutes 3 seconds  Total Procedure Duration: 0 hours 14 minutes 40 seconds  Findings:                 The perianal and digital rectal examinations were                            normal.                           A 2 mm polyp was found in the hepatic flexure. The                            polyp was sessile. The polyp was removed with a                            cold biopsy forceps. Resection and retrieval were  complete.                           The exam was otherwise without abnormality on                            direct and retroflexion views.                           Multiple medium-mouthed diverticula were found in                            the left colon. Complications:            No immediate complications. Estimated Blood Loss:     Estimated blood loss: none. Impression:               - One 2 mm polyp at the hepatic flexure, removed                            with a cold biopsy forceps. Resected and retrieved.                           - The examination was otherwise normal on direct                            and retroflexion views.                           - Diverticulosis in the left colon. Recommendation:           - Patient has a contact number available for                            emergencies. The signs and symptoms of potential                             delayed complications were discussed with the                            patient. Return to normal activities tomorrow.                            Written discharge instructions were provided to the                            patient.                           - Resume previous diet.                           - Continue present medications.                           - Await pathology results.                           -  Repeat colonoscopy is recommended for                            surveillance. The colonoscopy date will be                            determined after pathology results from today's                            exam become available for review. Henry L. Loletha Carrow, MD 12/09/2015 9:30:53 AM This report has been signed electronically.

## 2015-12-09 NOTE — Progress Notes (Signed)
Stable to RR 

## 2015-12-09 NOTE — Patient Instructions (Signed)
YOU HAD AN ENDOSCOPIC PROCEDURE TODAY AT [facility]:   Refer to the procedure report that was given to you for any specific questions about what was found during the examination.  If the procedure report does not answer your questions, please call your gastroenterologist to clarify.  If you requested that your care partner not be given the details of your procedure findings, then the procedure report has been included in a sealed envelope for you to review at your convenience later.  YOU SHOULD EXPECT: Some feelings of bloating in the abdomen. Passage of more gas than usual.  Walking can help get rid of the air that was put into your GI tract during the procedure and reduce the bloating. If you had a lower endoscopy (such as a colonoscopy or flexible sigmoidoscopy) you may notice spotting of blood in your stool or on the toilet paper. If you underwent a bowel prep for your procedure, you may not have a normal bowel movement for a few days.  Please Note:  You might notice some irritation and congestion in your nose or some drainage.  This is from the oxygen used during your procedure.  There is no need for concern and it should clear up in a day or so.  SYMPTOMS TO REPORT IMMEDIATELY:   Following lower endoscopy (colonoscopy or flexible sigmoidoscopy):  Excessive amounts of blood in the stool  Significant tenderness or worsening of abdominal pains  Swelling of the abdomen that is new, acute  Fever of 100F or higher   For urgent or emergent issues, a gastroenterologist can be reached at any hour by calling [phone].   DIET: Your first meal following the procedure should be a small meal and then it is ok to progress to your normal diet. Heavy or fried foods are harder to digest and may make you feel nauseous or bloated.  Likewise, meals heavy in dairy and vegetables can increase bloating.  Drink plenty of fluids but you should avoid alcoholic beverages for 24  hours.  ACTIVITY:  You should plan to take it easy for the rest of today and you should NOT DRIVE or use heavy machinery until tomorrow (because of the sedation medicines used during the test).    FOLLOW UP: Our staff will call the number listed on your records the next business day following your procedure to check on you and address any questions or concerns that you may have regarding the information given to you following your procedure. If we do not reach you, we will leave a message.  However, if you are feeling well and you are not experiencing any problems, there is no need to return our call.  We will assume that you have returned to your regular daily activities without incident.  If any biopsies were taken you will be contacted by phone or by letter within the next 1-3 weeks.  Please call us at [phone] if you have not heard about the biopsies in 3 weeks.    SIGNATURES/CONFIDENTIALITY: You and/or your care partner have signed paperwork which will be entered into your electronic medical record.  These signatures attest to the fact that that the information above on your After Visit Summary has b  Diverticulosis and polyp information given.

## 2015-12-09 NOTE — Progress Notes (Signed)
Called to room to assist during endoscopic procedure.  Patient ID and intended procedure confirmed with present staff. Received instructions for my participation in the procedure from the performing physician.  

## 2015-12-10 ENCOUNTER — Telehealth: Payer: Self-pay

## 2015-12-10 NOTE — Telephone Encounter (Signed)
  Follow up Call-  Call back number 12/09/2015  Post procedure Call Back phone  # [phone]  Permission to leave phone message Yes     Patient questions:  Do you have a fever, pain , or abdominal swelling? No. Pain Score  0 *  Have you tolerated food without any problems? Yes.    Have you been able to return to your normal activities? Yes.    Do you have any questions about your discharge instructions: Diet   No. Medications  No. Follow up visit  No.  Do you have questions or concerns about your Care? No.  Actions: * If pain score is 4 or above: No action needed, pain <4.

## 2015-12-12 ENCOUNTER — Encounter: Payer: Self-pay | Admitting: Gastroenterology

## 2015-12-18 ENCOUNTER — Ambulatory Visit (INDEPENDENT_AMBULATORY_CARE_PROVIDER_SITE_OTHER): Payer: BC Managed Care – PPO | Admitting: Family Medicine

## 2015-12-18 DIAGNOSIS — E538 Deficiency of other specified B group vitamins: Secondary | ICD-10-CM | POA: Diagnosis not present

## 2015-12-18 MED ORDER — CYANOCOBALAMIN 1000 MCG/ML IJ SOLN
1000.0000 ug | Freq: Once | INTRAMUSCULAR | Status: AC
  Administered 2015-12-18: 1000 ug via INTRAMUSCULAR

## 2015-12-28 ENCOUNTER — Other Ambulatory Visit: Payer: Self-pay | Admitting: Endocrinology

## 2016-01-20 ENCOUNTER — Other Ambulatory Visit: Payer: Self-pay | Admitting: Endocrinology

## 2016-01-20 ENCOUNTER — Ambulatory Visit (INDEPENDENT_AMBULATORY_CARE_PROVIDER_SITE_OTHER): Payer: BC Managed Care – PPO | Admitting: Family Medicine

## 2016-01-20 DIAGNOSIS — D519 Vitamin B12 deficiency anemia, unspecified: Secondary | ICD-10-CM | POA: Diagnosis not present

## 2016-01-20 MED ORDER — CYANOCOBALAMIN 1000 MCG/ML IJ SOLN
1000.0000 ug | INTRAMUSCULAR | Status: DC
  Administered 2016-01-20 – 2017-05-03 (×8): 1000 ug via SUBCUTANEOUS

## 2016-01-26 ENCOUNTER — Other Ambulatory Visit: Payer: Self-pay | Admitting: Endocrinology

## 2016-01-31 ENCOUNTER — Other Ambulatory Visit: Payer: Self-pay | Admitting: Endocrinology

## 2016-02-20 ENCOUNTER — Ambulatory Visit (INDEPENDENT_AMBULATORY_CARE_PROVIDER_SITE_OTHER): Payer: BC Managed Care – PPO | Admitting: *Deleted

## 2016-02-20 DIAGNOSIS — D519 Vitamin B12 deficiency anemia, unspecified: Secondary | ICD-10-CM | POA: Diagnosis not present

## 2016-03-03 ENCOUNTER — Other Ambulatory Visit (INDEPENDENT_AMBULATORY_CARE_PROVIDER_SITE_OTHER): Payer: BC Managed Care – PPO

## 2016-03-03 DIAGNOSIS — E1165 Type 2 diabetes mellitus with hyperglycemia: Secondary | ICD-10-CM

## 2016-03-03 LAB — COMPREHENSIVE METABOLIC PANEL
ALBUMIN: 4 g/dL (ref 3.5–5.2)
ALT: 10 U/L (ref 0–35)
AST: 15 U/L (ref 0–37)
Alkaline Phosphatase: 72 U/L (ref 39–117)
BILIRUBIN TOTAL: 0.4 mg/dL (ref 0.2–1.2)
BUN: 19 mg/dL (ref 6–23)
CALCIUM: 9.5 mg/dL (ref 8.4–10.5)
CO2: 31 mEq/L (ref 19–32)
CREATININE: 1.13 mg/dL (ref 0.40–1.20)
Chloride: 103 mEq/L (ref 96–112)
GFR: 64.03 mL/min (ref >60.00)
Glucose, Bld: 92 mg/dL (ref 70–99)
Potassium: 4.6 mEq/L (ref 3.5–5.1)
Sodium: 139 mEq/L (ref 135–145)
Total Protein: 7.9 g/dL (ref 6.0–8.3)

## 2016-03-03 LAB — LIPID PANEL
CHOLESTEROL: 141 mg/dL (ref 0–200)
HDL: 41.9 mg/dL (ref >39.00)
LDL Cholesterol: 85 mg/dL (ref 0–99)
NonHDL: 99.15
TRIGLYCERIDES: 73 mg/dL (ref 0.0–149.0)
Total CHOL/HDL Ratio: 3
VLDL: 14.6 mg/dL (ref 0.0–40.0)

## 2016-03-03 LAB — HEMOGLOBIN A1C: Hgb A1c MFr Bld: 6.9 % — ABNORMAL HIGH (ref 4.6–6.5)

## 2016-03-06 ENCOUNTER — Ambulatory Visit (INDEPENDENT_AMBULATORY_CARE_PROVIDER_SITE_OTHER): Payer: BC Managed Care – PPO | Admitting: Endocrinology

## 2016-03-06 ENCOUNTER — Encounter: Payer: Self-pay | Admitting: Endocrinology

## 2016-03-06 VITALS — BP 138/80 | HR 85 | Temp 98.3°F | Resp 14 | Ht 60.0 in | Wt 150.8 lb

## 2016-03-06 DIAGNOSIS — I10 Essential (primary) hypertension: Secondary | ICD-10-CM

## 2016-03-06 DIAGNOSIS — E119 Type 2 diabetes mellitus without complications: Secondary | ICD-10-CM | POA: Diagnosis not present

## 2016-03-06 DIAGNOSIS — Z23 Encounter for immunization: Secondary | ICD-10-CM

## 2016-03-06 NOTE — Progress Notes (Signed)
Patient ID: Sherry Hart, female   DOB: Apr 12, 1961, 55 y.o.   MRN: [id number]   Reason for Appointment : Followup of diabetes   History of Present Illness          Problem 1: Type 2 diabetes mellitus, date of diagnosis: 2002       Past history: She had been treated with metformin since diagnosis and previous records are not available for review. She thinks that when she was taking her blood sugar regularly there were usually below 150 in the morning In 2012 because of lack of insurance she did not take her medications or check her sugar for about a year In 6/13 she was evaluated in the emergency room for abdominal pain and was found to have an A1c of 14% with very high blood sugars. Her only symptoms were weight loss. She was given Lantus, unknown dose for about 2 months but she went off this on her own since she did not want to continue this while going to work. Also was presumably given glipizide at that time but no details available She was seen for initial consultation in 1/15 and at that time her glucose was 431 with A1c of 13.1 Initially treated with insulin but this was later replaced with oral agents She started taking Kombiglyze XR on 08/08/13 and had gone up to 2 tablets daily Because of tendency to relatively higher fasting readings she was given low-dose Amaryl in 3/15 in addition  Recent history:  Oral hypoglycemic drugs the patient is taking are:  Glimeperide 0.5 mg acs, Janumet XR 100/1000 daily, Jardiance 25 mg at dinner  Her blood sugars are better now with A1c 6.9 compared to 7.7 before   Current blood sugars at home and management:  Fasting glucose appears to be high normal early in the morning but better late morning including on the labs  She has low normal readings around suppertime but not checking much  Also has only one reading after evening meal  Despite walking regularly now she is not to lose weight  She again tries to watch her carbohydrates  and portions  No hypoglycemia, she thinks he feels shaky of the blood sugars are 90 or below     Side effects from medications have been:  GI side effects from 2000 mg metformin ER  Glucose monitoring:  done infrequently, mostly in the morning        Glucometer: Freestyle    Blood Glucose readings by download:  Mean values apply above for all meters except median for One Touch  PRE-MEAL Fasting Lunch Dinner Bedtime Overall  Glucose range: 102-146  66-90 112   Mean/median:     107     Glycemic control:   Lab Results  Component Value Date   HGBA1C 6.9 (H) 03/03/2016   HGBA1C 7.7 (H) 09/24/2015   HGBA1C 6.8 (H) 07/03/2015   Lab Results  Component Value Date   MICROALBUR 0.7 03/21/2015   LDLCALC 85 03/03/2016   CREATININE 1.13 03/03/2016   Self-care: The diet that the patient has been following is:  usually low fat, low carbs, Variable.  Breakfast may be rice cake, occasionally cereal or peanut butter crackers Usually has fruit for snacks   Avoiding drinks with sugar, dinner is at 5-7 pm            Exercise: walks 45 min in pms     Dietician visit: Most recent: 2002 attended a class  Compliance with the medical regimen: fair  Weight history: 125 upto 173, her lowest weight was in 11/2011  Wt Readings from Last 3 Encounters:  03/06/16 150 lb 12.8 oz (68.4 kg)  12/09/15 149 lb (67.6 kg)  12/06/15 149 lb (67.6 kg)   Lab on 03/03/2016  Component Date Value Ref Range Status  . Hgb A1c MFr Bld 03/03/2016 6.9* 4.6 - 6.5 % Final  . Sodium 03/03/2016 139  135 - 145 mEq/L Final  . Potassium 03/03/2016 4.6  3.5 - 5.1 mEq/L Final  . Chloride 03/03/2016 103  96 - 112 mEq/L Final  . CO2 03/03/2016 31  19 - 32 mEq/L Final  . Glucose, Bld 03/03/2016 92  70 - 99 mg/dL Final  . BUN 03/03/2016 19  6 - 23 mg/dL Final  . Creatinine, Ser 03/03/2016 1.13  0.40 - 1.20 mg/dL Final  . Total Bilirubin 03/03/2016 0.4  0.2 - 1.2 mg/dL Final  . Alkaline Phosphatase 03/03/2016 72   39 - 117 U/L Final  . AST 03/03/2016 15  0 - 37 U/L Final  . ALT 03/03/2016 10  0 - 35 U/L Final  . Total Protein 03/03/2016 7.9  6.0 - 8.3 g/dL Final  . Albumin 03/03/2016 4.0  3.5 - 5.2 g/dL Final  . Calcium 03/03/2016 9.5  8.4 - 10.5 mg/dL Final  . GFR 03/03/2016 64.03  >60.00 mL/min Final  . Cholesterol 03/03/2016 141  0 - 200 mg/dL Final  . Triglycerides 03/03/2016 73.0  0.0 - 149.0 mg/dL Final  . HDL 03/03/2016 41.90  >39.00 mg/dL Final  . VLDL 03/03/2016 14.6  0.0 - 40.0 mg/dL Final  . LDL Cholesterol 03/03/2016 85  0 - 99 mg/dL Final  . Total CHOL/HDL Ratio 03/03/2016 3   Final  . NonHDL 03/03/2016 99.15   Final     HYPERTENSION: Treatment reviewed in review of systems     Medication List       Accurate as of 03/06/16 11:59 PM. Always use your most recent med list.          aspirin 81 MG tablet Take 81 mg by mouth daily.   atorvastatin 10 MG tablet Commonly known as:  LIPITOR TAKE 1 TABLET BY MOUTH DAILY   Cyanocobalamin 1000 MCG/ML Kit Inject 1,000 mg as directed every 30 (thirty) days.   empagliflozin 25 MG Tabs tablet Commonly known as:  JARDIANCE Take 25 mg by mouth daily.   freestyle lancets Use as instructed to check blood sugars 3 times per day   glimepiride 1 MG tablet Commonly known as:  AMARYL TAKE 1 TABLET WITH SUPPER EVERY DAY   JANUMET XR [phone] MG Tb24 Generic drug:  SitaGLIPtin-MetFORMIN HCl TAKE 1 TABLET BY MOUTH EVERY DAY   losartan 100 MG tablet Commonly known as:  COZAAR TAKE 1 TABLET BY MOUTH EVERY DAY   ONE TOUCH ULTRA 2 w/Device Kit Use to check blood sugar 3 times per day dx code E11.65       Allergies:  Allergies  Allergen Reactions  . Penicillins Hives    Back of neck and upper back.    Past Medical History:  Diagnosis Date  . Abdominal pain   . Anemia   . Biliary colic   . Constipation    occasionally not chronic per pt. -no medicines for this   . Diabetes mellitus 12 yrs ago   . Hyperlipidemia   .  Hypertension   . Unexplained weight loss   . Vomiting   . Weakness  Past Surgical History:  Procedure Laterality Date  . CESAREAN SECTION  11/29/1990, 04/06/1994  . COLONOSCOPY  09/01/2004   all normal - in epic  . KNEE SURGERY  11/2000   right  . MOUTH SURGERY  04/04/1987  . UPPER GASTROINTESTINAL ENDOSCOPY  09-01-04   gastric polyp- m.johnson- in epic  . WISDOM TOOTH EXTRACTION      Family History  Problem Relation Age of Onset  . Cancer Mother     breast  . Breast cancer Mother   . Diabetes Father   . Thyroid disease Sister   . Hypertension Neg Hx   . Colon cancer Neg Hx   . Colon polyps Neg Hx   . Rectal cancer Neg Hx   . Stomach cancer Neg Hx     Social History:  reports that she has never smoked. She has never used smokeless tobacco. She reports that she does not drink alcohol or use drugs.    Review of Systems     HYPERTENSION: blood pressure has been treated with Cozaar 168m   Her blood pressure tends to be higher in the office At least on initial exam Has not checked at home Again  BP Readings from Last 3 Encounters:  03/06/16 138/80  12/09/15 (!) 142/87  12/06/15 132/80      Lipids:  She has been taking Lipitor that was prescribed in 12/2013 and her LDL is at target   Lab Results  Component Value Date   CHOL 141 03/03/2016   HDL 41.90 03/03/2016   LDLCALC 85 03/03/2016   LDLDIRECT 175.6 07/28/2013   TRIG 73.0 03/03/2016   CHOLHDL 3 03/03/2016    Diabetic foot exam done in 08/2014 normal except for Decreased distal monofilament sensation on the toes and plantar surfaces  Eye exam was normal in 3/17  She has vitamin B-12 deficiency and is on injectable treatment monthly from PCP, Asking about duration of treatment  Physical Examination:  BP 138/80   Pulse 85   Temp 98.3 F (36.8 C)   Resp 14   Ht 5' (1.524 m)   Wt 150 lb 12.8 oz (68.4 kg)   SpO2 96%   BMI 29.45 kg/m     ASSESSMENT/PLAN:   Diabetes type 2 See history of  present illness for detailed discussion of his current management, blood sugar patterns and problems identified Blood sugars have been better controlled with A1c now below 7 She tends to have high readings overnight and not clear if she tends to have high readings after supper when she is not monitoring much Only taking 0.5 mg Amaryl which helps fasting glucose Also appears to have early morning rising blood sugar She is quite well motivated to exercise and watch her diet also  She will continue same regimen and come back for A1c in 3 months  Hypertension: Blood pressure is controlled, tends to have high readings with initial exams  There are no Patient Instructions on file for this visit.   Sherry Hart 03/08/2016, 8:00 PM   Note: This office note was prepared with [employer] agent Any transcriptional errors that result from this process are unintentional.

## 2016-03-23 ENCOUNTER — Ambulatory Visit (INDEPENDENT_AMBULATORY_CARE_PROVIDER_SITE_OTHER): Payer: BC Managed Care – PPO | Admitting: Family Medicine

## 2016-03-23 ENCOUNTER — Encounter: Payer: Self-pay | Admitting: Family Medicine

## 2016-03-23 VITALS — BP 140/82 | HR 80 | Temp 97.9°F | Resp 16 | Wt 151.0 lb

## 2016-03-23 DIAGNOSIS — D510 Vitamin B12 deficiency anemia due to intrinsic factor deficiency: Secondary | ICD-10-CM | POA: Diagnosis not present

## 2016-03-23 MED ORDER — CYANOCOBALAMIN 1000 MCG/ML IJ SOLN
1000.0000 ug | Freq: Once | INTRAMUSCULAR | Status: AC
  Administered 2016-03-23: 1000 ug via INTRAMUSCULAR

## 2016-03-23 NOTE — Progress Notes (Signed)
Subjective:    Patient ID: Sherry Hart, female    DOB: 04-09-1961, 55 y.o.   MRN: [id number]  HPI3/11/16 Patient has a long-standing history of type 2 diabetes mellitus which is been uncontrolled. She also has a remote history of vitamin B12 deficiency. Over the last several months she started to notice numbness in both feet. It is located primarily on the plantar aspects near the MTP joints however the numbness sometimes extends up both shins. She also complains of some tingling in both feet. She denies any low back pain or symptoms of sciatica. She denies any bowel or bladder incontinence. She also reports tingling in the third and fourth digits on both hands.  Tinel sign and Phalen sign are negative today on examination. She denies any neck pain or symptoms of cervical radiculopathy. She denies any weakness in her hands or feet. She denies any muscle weakness in general.  At that time, my plan was:  I will check a hemoglobin A1c, TSH, and a vitamin B12 to evaluate for metabolic causes of peripheral neuropathy. I suspect the patient has long-standing diabetic neuropathy. If her TSH and vitamin B12 are normal in her hemoglobin A1c is controlled, I would recommend nerve conduction studies to rule out other possible causes of neuropathy.  10/05/14 Lab work revealed anemia, low iron, and B12 deficiency. Stool cards revealed no occult GI blood loss.  Patient was instructed to start taking iron and come in for vitamin B12 injections.  Please see the lab results from her last office visit and the notes attached to those lab results.  Patient misunderstood. She has been taking B12 orally.  She continues to complain of neuropathy in the feet. She continues to complain of numbness and tingling. She is also having some numbness and tingling in the third and fourth fingers on her right hand. Recently she saw a advertisement warning about side effects from [place] XR. Therefore the patient discontinued her  Kombiglyze XR and her Amaryl independently. She may also be off of her blood pressure medicine although she is unsure. Her blood sugars are now over 300. Her blood pressure is elevated. I spent approximately 20 minutes explaining the risk of uncontrolled diabetes and the low probability of side effects from her diabetes medication particularly the ones advertisement on TV by lawyers.  At that time, my plan was: Patient agrees to resume her diabetes medication and her blood pressure medication immediately. She will call me with her blood pressures next week. She agrees to take iron. She is going to start B12 injections. She received her first B12 injection today. I would like her to return everyday for the next week, then once a week for the next month, and then once a month thereafter. Repeat a CBC in one month. She is going to discuss switching diabetes medications with her endocrinologist.  03/23/16 Patient has not been seen in quite some time. She is already had her flu shot at her endocrinologist. Her mammogram was done earlier this year as well as her colonoscopy. Her colonoscopy was significant for 1 polyp. They recommended a repeat colonoscopy in 5 years. Otherwise she's been doing well. She recently saw her endocrinologist. Her A1c was 6.9 which was good for this patient. Her LDL cholesterol was below 100. Her renal function and liver function remains stable. However she is long overdue to recheck a CBC. She has a history of pernicious anemia and iron deficiency anemia. She is currently taking B12 injections every month. She  stopped her iron supplement Past Medical History:  Diagnosis Date  . Abdominal pain   . Anemia   . Biliary colic   . Constipation    occasionally not chronic per pt. -no medicines for this   . Diabetes mellitus 12 yrs ago   . Hyperlipidemia   . Hypertension   . Unexplained weight loss   . Vomiting   . Weakness    Past Surgical History:  Procedure Laterality Date  .  CESAREAN SECTION  11/29/1990, 04/06/1994  . COLONOSCOPY  09/01/2004   all normal - in epic  . KNEE SURGERY  11/2000   right  . MOUTH SURGERY  04/04/1987  . UPPER GASTROINTESTINAL ENDOSCOPY  09-01-04   gastric polyp- m.johnson- in epic  . WISDOM TOOTH EXTRACTION     Current Outpatient Prescriptions on File Prior to Visit  Medication Sig Dispense Refill  . aspirin 81 MG tablet Take 81 mg by mouth daily.    Marland Kitchen atorvastatin (LIPITOR) 10 MG tablet TAKE 1 TABLET BY MOUTH DAILY 30 tablet 3  . Blood Glucose Monitoring Suppl (ONE TOUCH ULTRA 2) W/DEVICE KIT Use to check blood sugar 3 times per day dx code E11.65 1 each 0  . Cyanocobalamin 1000 MCG/ML KIT Inject 1,000 mg as directed every 30 (thirty) days.    . empagliflozin (JARDIANCE) 25 MG TABS tablet Take 25 mg by mouth daily. 90 tablet 1  . glimepiride (AMARYL) 1 MG tablet TAKE 1 TABLET WITH SUPPER EVERY DAY 30 tablet 3  . JANUMET XR [phone] MG TB24 TAKE 1 TABLET BY MOUTH EVERY DAY 30 tablet 3  . Lancets (FREESTYLE) lancets Use as instructed to check blood sugars 3 times per day 100 each 5  . losartan (COZAAR) 100 MG tablet TAKE 1 TABLET BY MOUTH EVERY DAY 30 tablet 3   Current Facility-Administered Medications on File Prior to Visit  Medication Dose Route Frequency Provider Last Rate Last Dose  . cyanocobalamin ((VITAMIN B-12)) injection 1,000 mcg  1,000 mcg Subcutaneous Q30 days Susy Frizzle, MD   1,000 mcg at 02/20/16 1618   Allergies  Allergen Reactions  . Penicillins Hives    Back of neck and upper back.   Social History   Social History  . Marital status: Married    Spouse name: N/A  . Number of children: N/A  . Years of education: N/A   Occupational History  . Not on file.   Social History Main Topics  . Smoking status: Never Smoker  . Smokeless tobacco: Never Used  . Alcohol use No  . Drug use: No  . Sexual activity: Not on file   Other Topics Concern  . Not on file   Social History Narrative  . No narrative on  file      Review of Systems  All other systems reviewed and are negative.      Objective:   Physical Exam  Constitutional: She is oriented to person, place, and time.  Cardiovascular: Normal rate, regular rhythm, normal heart sounds and intact distal pulses.   Pulmonary/Chest: Effort normal and breath sounds normal.  Musculoskeletal: Normal range of motion. She exhibits no edema or tenderness.  Neurological: She is alert and oriented to person, place, and time. She has normal reflexes. No cranial nerve deficit. She exhibits normal muscle tone. Coordination normal.  Vitals reviewed.         Assessment & Plan:  Vitamin B12 deficiency anemia due to intrinsic factor deficiency - Plan: CBC with Differential/Platelet, Anemia panel,  Vitamin B12  I explained to the patient that most people with pernicious anemia on lifelong B12 replacement via injection. I explained that this is due to the fact that they do not have intrinsic factor to allow them to absorb B12 from their gastrointestinal tract. I will check a B12 level along with a CBC. Patient states she feels much better than she did last year and she has more energy therefore symptomatically she is doing better. I will also check an iron level in case the patient is iron deficient as well.

## 2016-03-24 LAB — ANEMIA PANEL
%SAT: 12 % (ref 11–50)
ABS Retic: 51000 cells/uL (ref 20000–80000)
FERRITIN: 18 ng/mL (ref 10–232)
FOLATE: 18.8 ng/mL (ref >5.4)
Iron: 43 ug/dL — ABNORMAL LOW (ref 45–160)
RBC.: 4.25 MIL/uL (ref 3.80–5.10)
RETIC CT PCT: 1.2 %
TIBC: 364 ug/dL (ref 250–450)
UIBC: 321 ug/dL (ref 125–400)

## 2016-03-24 LAB — CBC WITH DIFFERENTIAL/PLATELET
BASOS PCT: 1 %
Basophils Absolute: 72 cells/uL (ref 0–200)
EOS PCT: 2 %
Eosinophils Absolute: 144 cells/uL (ref 15–500)
HCT: 34.8 % — ABNORMAL LOW (ref 35.0–45.0)
Hemoglobin: 11 g/dL — ABNORMAL LOW (ref 12.0–15.0)
LYMPHS PCT: 33 %
Lymphs Abs: 2376 cells/uL (ref 850–3900)
MCH: 25.9 pg — ABNORMAL LOW (ref 27.0–33.0)
MCHC: 31.6 g/dL — ABNORMAL LOW (ref 32.0–36.0)
MCV: 81.9 fL (ref 80.0–100.0)
MONO ABS: 576 {cells}/uL (ref 200–950)
MONOS PCT: 8 %
MPV: 9.8 fL (ref 7.5–12.5)
Neutro Abs: 4032 cells/uL (ref 1500–7800)
Neutrophils Relative %: 56 %
PLATELETS: 296 10*3/uL (ref 140–400)
RBC: 4.25 MIL/uL (ref 3.80–5.10)
RDW: 15.7 % — AB (ref 11.0–15.0)
WBC: 7.2 10*3/uL (ref 3.8–10.8)

## 2016-04-10 ENCOUNTER — Other Ambulatory Visit: Payer: Self-pay

## 2016-04-10 MED ORDER — ATORVASTATIN CALCIUM 10 MG PO TABS: 10.0000 mg | ORAL_TABLET | Freq: Every day | ORAL | 1 refills | Status: DC

## 2016-04-23 ENCOUNTER — Ambulatory Visit (INDEPENDENT_AMBULATORY_CARE_PROVIDER_SITE_OTHER): Payer: BC Managed Care – PPO | Admitting: Family Medicine

## 2016-04-23 DIAGNOSIS — E538 Deficiency of other specified B group vitamins: Secondary | ICD-10-CM

## 2016-04-23 MED ORDER — CYANOCOBALAMIN 1000 MCG/ML IJ SOLN
1000.0000 ug | Freq: Once | INTRAMUSCULAR | Status: AC
  Administered 2016-04-23: 1000 ug via INTRAMUSCULAR

## 2016-05-14 ENCOUNTER — Other Ambulatory Visit: Payer: Self-pay | Admitting: Endocrinology

## 2016-05-25 ENCOUNTER — Ambulatory Visit (INDEPENDENT_AMBULATORY_CARE_PROVIDER_SITE_OTHER): Payer: BC Managed Care – PPO | Admitting: Family Medicine

## 2016-05-25 DIAGNOSIS — D519 Vitamin B12 deficiency anemia, unspecified: Secondary | ICD-10-CM

## 2016-05-25 DIAGNOSIS — D510 Vitamin B12 deficiency anemia due to intrinsic factor deficiency: Secondary | ICD-10-CM

## 2016-06-05 ENCOUNTER — Other Ambulatory Visit: Payer: Self-pay | Admitting: Endocrinology

## 2016-06-05 NOTE — Telephone Encounter (Signed)
Losartan sent to pharmacy #[phone] refills needs to f/u with PCP for further refills since PCP originally order medication and needs to monitor

## 2016-06-16 ENCOUNTER — Encounter: Payer: Self-pay | Admitting: Family Medicine

## 2016-06-16 ENCOUNTER — Ambulatory Visit (INDEPENDENT_AMBULATORY_CARE_PROVIDER_SITE_OTHER): Payer: BC Managed Care – PPO | Admitting: Family Medicine

## 2016-06-16 VITALS — BP 142/86 | HR 84 | Temp 98.7°F | Resp 14 | Wt 157.0 lb

## 2016-06-16 DIAGNOSIS — M6740 Ganglion, unspecified site: Secondary | ICD-10-CM | POA: Diagnosis not present

## 2016-06-16 NOTE — Progress Notes (Signed)
Subjective:    Patient ID: Sherry Hart, female    DOB: 1961/01/11, 56 y.o.   MRN: [id number]  HPI 03/23/16 Patient has not been seen in quite some time. She is already had her flu shot at her endocrinologist. Her mammogram was done earlier this year as well as her colonoscopy. Her colonoscopy was significant for 1 polyp. They recommended a repeat colonoscopy in 5 years. Otherwise she's been doing well. She recently saw her endocrinologist. Her A1c was 6.9 which was good for this patient. Her LDL cholesterol was below 100. Her renal function and liver function remains stable. However she is long overdue to recheck a CBC. She has a history of pernicious anemia and iron deficiency anemia. She is currently taking B12 injections every month. She stopped her iron supplement  06/16/16 Patient course of 3-4 week history of a cystlike mass on the dorsal surface of the left first IP joint.  Presentation is consistent with a ganglion cyst Past Medical History:  Diagnosis Date  . Abdominal pain   . Anemia   . B12 deficiency   . Biliary colic   . Constipation    occasionally not chronic per pt. -no medicines for this   . Diabetes mellitus 12 yrs ago   . Hyperlipidemia   . Hypertension   . Pernicious anemia   . Unexplained weight loss   . Vomiting   . Weakness    Past Surgical History:  Procedure Laterality Date  . CESAREAN SECTION  11/29/1990, 04/06/1994  . COLONOSCOPY  09/01/2004   all normal - in epic  . KNEE SURGERY  11/2000   right  . MOUTH SURGERY  04/04/1987  . UPPER GASTROINTESTINAL ENDOSCOPY  09-01-04   gastric polyp- m.johnson- in epic  . WISDOM TOOTH EXTRACTION     Current Outpatient Prescriptions on File Prior to Visit  Medication Sig Dispense Refill  . aspirin 81 MG tablet Take 81 mg by mouth daily.    Marland Kitchen atorvastatin (LIPITOR) 10 MG tablet Take 1 tablet (10 mg total) by mouth daily. 90 tablet 1  . Blood Glucose Monitoring Suppl (ONE TOUCH ULTRA 2) W/DEVICE KIT Use to check  blood sugar 3 times per day dx code E11.65 1 each 0  . Cyanocobalamin 1000 MCG/ML KIT Inject 1,000 mg as directed every 30 (thirty) days.    . empagliflozin (JARDIANCE) 25 MG TABS tablet Take 25 mg by mouth daily. 90 tablet 1  . glimepiride (AMARYL) 1 MG tablet TAKE 1 TABLET WITH SUPPER EVERY DAY 30 tablet 3  . JANUMET XR [phone] MG TB24 TAKE 1 TABLET BY MOUTH EVERY DAY 30 tablet 3  . Lancets (FREESTYLE) lancets Use as instructed to check blood sugars 3 times per day 100 each 5  . losartan (COZAAR) 100 MG tablet TAKE 1 TABLET BY MOUTH EVERY DAY 30 tablet 0   Current Facility-Administered Medications on File Prior to Visit  Medication Dose Route Frequency Provider Last Rate Last Dose  . cyanocobalamin ((VITAMIN B-12)) injection 1,000 mcg  1,000 mcg Subcutaneous Q30 days Susy Frizzle, MD   1,000 mcg at 05/25/16 1615   Allergies  Allergen Reactions  . Penicillins Hives    Back of neck and upper back.   Social History   Social History  . Marital status: Married    Spouse name: N/A  . Number of children: N/A  . Years of education: N/A   Occupational History  . Not on file.   Social History Main Topics  .  Smoking status: Never Smoker  . Smokeless tobacco: Never Used  . Alcohol use No  . Drug use: No  . Sexual activity: Not on file   Other Topics Concern  . Not on file   Social History Narrative  . No narrative on file      Review of Systems  All other systems reviewed and are negative.      Objective:   Physical Exam  Constitutional: She is oriented to person, place, and time.  Cardiovascular: Normal rate, regular rhythm, normal heart sounds and intact distal pulses.   Pulmonary/Chest: Effort normal and breath sounds normal.  Musculoskeletal: Normal range of motion. She exhibits no edema or tenderness.  Neurological: She is alert and oriented to person, place, and time. She has normal reflexes. No cranial nerve deficit. She exhibits normal muscle tone.  Coordination normal.  Vitals reviewed.   7 mm ganglion cyst on left 1st IP join      Assessment & Plan:  Ganglion cyst  Offer the patient a referral to a hand surgeon for excision. She would like to try aspiration first. Therefore I prepped the finger in sterile fashion. I anesthetized the skin was 0.1% lidocaine. Using an 18-gauge needle, I aspirated the cyst sac and received clear gelatinous material consistent with a ganglion cyst. I then bandaged, the pressure dressing. Should the cyst return, would recommend excision at a hand surgeon

## 2016-06-25 ENCOUNTER — Ambulatory Visit (INDEPENDENT_AMBULATORY_CARE_PROVIDER_SITE_OTHER): Payer: BC Managed Care – PPO | Admitting: *Deleted

## 2016-06-25 DIAGNOSIS — D519 Vitamin B12 deficiency anemia, unspecified: Secondary | ICD-10-CM | POA: Diagnosis not present

## 2016-06-25 DIAGNOSIS — D510 Vitamin B12 deficiency anemia due to intrinsic factor deficiency: Secondary | ICD-10-CM | POA: Diagnosis not present

## 2016-06-25 NOTE — Patient Instructions (Signed)
Return in 1 month for next injection

## 2016-06-25 NOTE — Progress Notes (Signed)
Patient ID: Sherry Hart, female   DOB: [birth year], 56 y.o.   MRN: [id number]  Patient seen in office for Vitamin B 12 injection.   Tolerated IM administration well.

## 2016-07-05 ENCOUNTER — Other Ambulatory Visit: Payer: Self-pay | Admitting: Endocrinology

## 2016-07-27 ENCOUNTER — Ambulatory Visit (INDEPENDENT_AMBULATORY_CARE_PROVIDER_SITE_OTHER): Payer: BC Managed Care – PPO | Admitting: Family Medicine

## 2016-07-27 DIAGNOSIS — E538 Deficiency of other specified B group vitamins: Secondary | ICD-10-CM

## 2016-07-27 DIAGNOSIS — D510 Vitamin B12 deficiency anemia due to intrinsic factor deficiency: Secondary | ICD-10-CM

## 2016-07-27 MED ORDER — CYANOCOBALAMIN 1000 MCG/ML IJ SOLN
1000.0000 ug | INTRAMUSCULAR | Status: DC
  Administered 2016-07-27 – 2018-01-20 (×14): 1000 ug via SUBCUTANEOUS

## 2016-08-11 ENCOUNTER — Other Ambulatory Visit: Payer: Self-pay | Admitting: Family Medicine

## 2016-08-11 DIAGNOSIS — Z1231 Encounter for screening mammogram for malignant neoplasm of breast: Secondary | ICD-10-CM

## 2016-08-13 ENCOUNTER — Other Ambulatory Visit: Payer: Self-pay | Admitting: Endocrinology

## 2016-08-24 ENCOUNTER — Ambulatory Visit (INDEPENDENT_AMBULATORY_CARE_PROVIDER_SITE_OTHER): Payer: BC Managed Care – PPO | Admitting: Family Medicine

## 2016-08-24 DIAGNOSIS — D519 Vitamin B12 deficiency anemia, unspecified: Secondary | ICD-10-CM

## 2016-08-24 DIAGNOSIS — E538 Deficiency of other specified B group vitamins: Secondary | ICD-10-CM

## 2016-08-28 ENCOUNTER — Ambulatory Visit
Admission: RE | Admit: 2016-08-28 | Discharge: 2016-08-28 | Disposition: A | Discharge status: Home / Self Care | Payer: BC Managed Care – PPO | Source: Ambulatory Visit | Attending: Family Medicine | Admitting: Family Medicine

## 2016-08-28 DIAGNOSIS — Z1231 Encounter for screening mammogram for malignant neoplasm of breast: Secondary | ICD-10-CM

## 2016-09-02 ENCOUNTER — Other Ambulatory Visit (INDEPENDENT_AMBULATORY_CARE_PROVIDER_SITE_OTHER): Payer: BC Managed Care – PPO

## 2016-09-02 DIAGNOSIS — E119 Type 2 diabetes mellitus without complications: Secondary | ICD-10-CM

## 2016-09-02 LAB — URINALYSIS, ROUTINE W REFLEX MICROSCOPIC
BILIRUBIN URINE: NEGATIVE
Hgb urine dipstick: NEGATIVE
KETONES UR: NEGATIVE
Leukocytes, UA: NEGATIVE
NITRITE: NEGATIVE
Specific Gravity, Urine: 1.01 (ref 1.000–1.030)
Total Protein, Urine: NEGATIVE
Urine Glucose: >=1000 — AB
Urobilinogen, UA: 0.2 (ref 0.0–1.0)
pH: 7 (ref 5.0–8.0)

## 2016-09-02 LAB — BASIC METABOLIC PANEL
BUN: 16 mg/dL (ref 6–23)
CO2: 28 mEq/L (ref 19–32)
Calcium: 10.3 mg/dL (ref 8.4–10.5)
Chloride: 106 mEq/L (ref 96–112)
Creatinine, Ser: 1.13 mg/dL (ref 0.40–1.20)
GFR: 63.92 mL/min (ref >60.00)
Glucose, Bld: 125 mg/dL — ABNORMAL HIGH (ref 70–99)
POTASSIUM: 4.9 meq/L (ref 3.5–5.1)
SODIUM: 141 meq/L (ref 135–145)

## 2016-09-02 LAB — MICROALBUMIN / CREATININE URINE RATIO
Creatinine,U: 72.3 mg/dL
MICROALB UR: 1.1 mg/dL (ref 0.0–1.9)
Microalb Creat Ratio: 1.5 mg/g (ref 0.0–30.0)

## 2016-09-02 LAB — HEMOGLOBIN A1C: Hgb A1c MFr Bld: 7.6 % — ABNORMAL HIGH (ref 4.6–6.5)

## 2016-09-08 ENCOUNTER — Encounter: Payer: Self-pay | Admitting: Endocrinology

## 2016-09-08 ENCOUNTER — Ambulatory Visit (INDEPENDENT_AMBULATORY_CARE_PROVIDER_SITE_OTHER): Payer: BC Managed Care – PPO | Admitting: Endocrinology

## 2016-09-08 VITALS — BP 130/70 | HR 93 | Ht 60.0 in | Wt 153.0 lb

## 2016-09-08 DIAGNOSIS — E1165 Type 2 diabetes mellitus with hyperglycemia: Secondary | ICD-10-CM

## 2016-09-08 NOTE — Progress Notes (Signed)
Patient ID: Sherry Hart, female   DOB: 1960/07/11, 56 y.o.   MRN: [id number]   Reason for Appointment : Followup of diabetes   History of Present Illness          Problem 1: Type 2 diabetes mellitus, date of diagnosis: 2002       Past history: Sherry Hart had been treated with metformin since diagnosis and previous records are not available for review. Sherry Hart thinks that when Sherry Hart was taking Sherry Hart blood sugar regularly there were usually below 150 in the morning In 2012 because of lack of insurance Sherry Hart did not take Sherry Hart medications or check Sherry Hart sugar for about a year In 6/13 Sherry Hart was evaluated in the emergency room for abdominal pain and was found to have an A1c of 14% with very high blood sugars. Sherry Hart only symptoms were weight loss. Sherry Hart was given Lantus, unknown dose for about 2 months but Sherry Hart went off this on Sherry Hart own since Sherry Hart did not want to continue this while going to work. Also was presumably given glipizide at that time but no details available Sherry Hart was seen for initial consultation in 1/15 and at that time Sherry Hart glucose was 431 with A1c of 13.1 Initially treated with insulin but this was later replaced with oral agents Sherry Hart started taking Kombiglyze XR on 08/08/13 and had gone up to 2 tablets daily Because of tendency to relatively higher fasting readings Sherry Hart was given low-dose Amaryl in 3/15 in addition  Recent history:  Oral hypoglycemic drugs the patient is taking are:  Glimeperide 0.5 mg acs, Janumet XR 100/1000 daily, Jardiance 25 mg at dinner  Sherry Hart blood sugars are not is well-controlled and A1c 7.6 Sherry Hart has not been seen since last October   Current blood sugars at home and management:  Sherry Hart did not bring Sherry Hart blood sugar monitor for download and not clear how often Sherry Hart is monitoring or when.  Fasting glucose has generally been high normal reportedly and was 125 in the lab  Sherry Hart may have occasional readings in the 160s after lunch or supper but not sure how often Sherry Hart is  monitoring  Currently Sherry Hart is not doing any exercise and does not like to go outside and winter.  However Sherry Hart has lost weight slightly  Sherry Hart is not eating balanced meals consistently especially at breakfast when Sherry Hart is getting mostly carbohydrate, Sherry Hart does like to eat fruit including at breakfast  No hypoglycemia, Sherry Hart thinks he feels shaky occasionally Sherry Hart is late for supper    Side effects from medications have been:  GI side effects from 2000 mg metformin ER  Glucose monitoring:  done infrequently, mostly in the morning        Glucometer: Freestyle    Blood Glucose readings by recall as above    Glycemic control:   Lab Results  Component Value Date   HGBA1C 7.6 (H) 09/02/2016   HGBA1C 6.9 (H) 03/03/2016   HGBA1C 7.7 (H) 09/24/2015   Lab Results  Component Value Date   MICROALBUR 1.1 09/02/2016   LDLCALC 85 03/03/2016   CREATININE 1.13 09/02/2016   Self-care: The diet that the patient has been following is:  usually low fat, low carbs, Variable.   Breakfast may be fruit , occasionally cereal or peanut butter crackers Usually has fruit for snacks   Avoiding drinks with sugar, dinner is at 5-7 pm            Exercise: Minimal recently    Dietician visit: Most recent:  2002 attended a class               Compliance with the medical regimen: fair  Weight history: 125 upto 173, Sherry Hart lowest weight was in 11/2011  Wt Readings from Last 3 Encounters:  09/08/16 153 lb (69.4 kg)  06/16/16 157 lb (71.2 kg)  03/23/16 151 lb (68.5 kg)   Lab on 09/02/2016  Component Date Value Ref Range Status  . Hgb A1c MFr Bld 09/02/2016 7.6* 4.6 - 6.5 % Final  . Sodium 09/02/2016 141  135 - 145 mEq/L Final  . Potassium 09/02/2016 4.9  3.5 - 5.1 mEq/L Final  . Chloride 09/02/2016 106  96 - 112 mEq/L Final  . CO2 09/02/2016 28  19 - 32 mEq/L Final  . Glucose, Bld 09/02/2016 125* 70 - 99 mg/dL Final  . BUN 09/02/2016 16  6 - 23 mg/dL Final  . Creatinine, Ser 09/02/2016 1.13  0.40 - 1.20 mg/dL  Final  . Calcium 09/02/2016 10.3  8.4 - 10.5 mg/dL Final  . GFR 09/02/2016 63.92  >60.00 mL/min Final  . Microalb, Ur 09/02/2016 1.1  0.0 - 1.9 mg/dL Final  . Creatinine,U 09/02/2016 72.3  mg/dL Final  . Microalb Creat Ratio 09/02/2016 1.5  0.0 - 30.0 mg/g Final  . Color, Urine 09/02/2016 YELLOW  Yellow;Lt. Yellow Final  . APPearance 09/02/2016 CLEAR  Clear Final  . Specific Gravity, Urine 09/02/2016 1.010  1.000 - 1.030 Final  . pH 09/02/2016 7.0  5.0 - 8.0 Final  . Total Protein, Urine 09/02/2016 NEGATIVE  Negative Final  . Urine Glucose 09/02/2016 >=1000* Negative Final  . Ketones, ur 09/02/2016 NEGATIVE  Negative Final  . Bilirubin Urine 09/02/2016 NEGATIVE  Negative Final  . Hgb urine dipstick 09/02/2016 NEGATIVE  Negative Final  . Urobilinogen, UA 09/02/2016 0.2  0.0 - 1.0 Final  . Leukocytes, UA 09/02/2016 NEGATIVE  Negative Final  . Nitrite 09/02/2016 NEGATIVE  Negative Final  . WBC, UA 09/02/2016 0-2/hpf  0-2/hpf Final  . Squamous Epithelial / LPF 09/02/2016 Rare(0-4/hpf)  Rare(0-4/hpf) Final     HYPERTENSION: Treatment reviewed in review of systems   Allergies as of 09/08/2016      Reactions   Penicillins Hives   Back of neck and upper back.      Medication List       Accurate as of 09/08/16  5:16 PM. Always use your most recent med list.          aspirin 81 MG tablet Take 81 mg by mouth daily.   atorvastatin 10 MG tablet Commonly known as:  LIPITOR Take 1 tablet (10 mg total) by mouth daily.   freestyle lancets Use as instructed to check blood sugars 3 times per day   glimepiride 1 MG tablet Commonly known as:  AMARYL TAKE 1 TABLET WITH SUPPER EVERY DAY   JANUMET XR [id number] MG Tb24 Generic drug:  SitaGLIPtin-MetFORMIN HCl TAKE 1 TABLET BY MOUTH EVERY DAY   JARDIANCE 25 MG Tabs tablet Generic drug:  empagliflozin TAKE 25 MG BY MOUTH DAILY.   losartan 100 MG tablet Commonly known as:  COZAAR TAKE 1 TABLET BY MOUTH EVERY DAY   ONE TOUCH ULTRA 2  w/Device Kit Use to check blood sugar 3 times per day dx code E11.65       Allergies:  Allergies  Allergen Reactions  . Penicillins Hives    Back of neck and upper back.    Past Medical History:  Diagnosis Date  . Abdominal pain   .  Anemia   . B12 deficiency   . Biliary colic   . Constipation    occasionally not chronic per pt. -no medicines for this   . Diabetes mellitus 12 yrs ago   . Hyperlipidemia   . Hypertension   . Pernicious anemia   . Unexplained weight loss   . Vomiting   . Weakness     Past Surgical History:  Procedure Laterality Date  . CESAREAN SECTION  11/29/1990, 04/06/1994  . COLONOSCOPY  09/01/2004   all normal - in epic  . KNEE SURGERY  11/2000   right  . MOUTH SURGERY  04/04/1987  . UPPER GASTROINTESTINAL ENDOSCOPY  09-01-04   gastric polyp- m.johnson- in epic  . WISDOM TOOTH EXTRACTION      Family History  Problem Relation Age of Onset  . Cancer Mother     breast  . Breast cancer Mother   . Diabetes Father   . Thyroid disease Sister   . Hypertension Neg Hx   . Colon cancer Neg Hx   . Colon polyps Neg Hx   . Rectal cancer Neg Hx   . Stomach cancer Neg Hx     Social History:  reports that Sherry Hart has never smoked. Sherry Hart has never used smokeless tobacco. Sherry Hart reports that Sherry Hart does not drink alcohol or use drugs.    Review of Systems     HYPERTENSION: blood pressure has been treated with Cozaar '100mg'$    Sherry Hart blood pressure tends to be higher in the office At times   BP Readings from Last 3 Encounters:  09/08/16 130/70  06/16/16 (!) 142/86  03/23/16 140/82      Lipids:   Sherry Hart has been taking Lipitor that was prescribed in 12/2013 and Sherry Hart LDL is at target   Lab Results  Component Value Date   CHOL 141 03/03/2016   HDL 41.90 03/03/2016   LDLCALC 85 03/03/2016   LDLDIRECT 175.6 07/28/2013   TRIG 73.0 03/03/2016   CHOLHDL 3 03/03/2016    Diabetic foot exam done in 6/17  Eye exam was normal in 3/17  Sherry Hart has vitamin B-12  deficiency and is on injectable treatment monthly from PCP, Asking about duration of treatment  Physical Examination:  BP 130/70   Pulse 93   Ht 5' (1.524 m)   Wt 153 lb (69.4 kg)   SpO2 98%   BMI 29.88 kg/m     ASSESSMENT/PLAN:   Diabetes type 2 See history of present illness for detailed discussion of his current management, blood sugar patterns and problems identified  Sherry Hart is coming back after several months for follow-up A1c 7.6 which is higher than usual Most likely is getting some postprandial hyperglycemia and may not be monitoring this enough Also fasting readings are high normal Sherry Hart has difficulty with good control and that Sherry Hart is exercising Also not getting balanced meals with more carbohydrate especially at breakfast  Sherry Hart will continue same regimen and come back for A1c in 3 months Sherry Hart thinks Sherry Hart can start exercising and is getting some equipment for exercise at Sherry Hart daughter's house Sherry Hart will also walk when the weather is warm Sherry Hart does need to check more readings after meals and bring monitor for follow-up on the next visit  Hypertension: Blood pressure is controlled  Patient Instructions  Less carbs in am, more protein and fiber      Sherry Hart 09/08/2016, 5:16 PM   Note: This office note was prepared with Dragon voice recognition system technology. Any transcriptional errors that result  from this process are unintentional.

## 2016-09-08 NOTE — Patient Instructions (Signed)
Less carbs in am, more protein and fiber

## 2016-09-10 ENCOUNTER — Other Ambulatory Visit: Payer: Self-pay | Admitting: Endocrinology

## 2016-09-13 ENCOUNTER — Other Ambulatory Visit: Payer: Self-pay | Admitting: Endocrinology

## 2016-09-17 ENCOUNTER — Other Ambulatory Visit: Payer: Self-pay | Admitting: Endocrinology

## 2016-09-24 ENCOUNTER — Ambulatory Visit (INDEPENDENT_AMBULATORY_CARE_PROVIDER_SITE_OTHER): Payer: BC Managed Care – PPO | Admitting: *Deleted

## 2016-09-24 DIAGNOSIS — D510 Vitamin B12 deficiency anemia due to intrinsic factor deficiency: Secondary | ICD-10-CM

## 2016-09-24 DIAGNOSIS — D519 Vitamin B12 deficiency anemia, unspecified: Secondary | ICD-10-CM | POA: Diagnosis not present

## 2016-10-08 ENCOUNTER — Other Ambulatory Visit: Payer: Self-pay | Admitting: Endocrinology

## 2016-10-09 ENCOUNTER — Other Ambulatory Visit: Payer: Self-pay | Admitting: Endocrinology

## 2016-10-12 ENCOUNTER — Other Ambulatory Visit: Payer: Self-pay | Admitting: Endocrinology

## 2016-10-14 ENCOUNTER — Other Ambulatory Visit: Payer: Self-pay | Admitting: Endocrinology

## 2016-10-19 LAB — HM DIABETES EYE EXAM

## 2016-10-20 ENCOUNTER — Other Ambulatory Visit: Payer: Self-pay

## 2016-10-20 MED ORDER — LOSARTAN POTASSIUM 100 MG PO TABS
100.0000 mg | ORAL_TABLET | Freq: Every day | ORAL | 4 refills | Status: DC
Start: 2016-10-20 — End: 2017-03-31

## 2016-10-23 ENCOUNTER — Encounter: Payer: Self-pay | Admitting: Family Medicine

## 2016-10-26 ENCOUNTER — Ambulatory Visit (INDEPENDENT_AMBULATORY_CARE_PROVIDER_SITE_OTHER): Payer: BC Managed Care – PPO | Admitting: Family Medicine

## 2016-10-26 DIAGNOSIS — E538 Deficiency of other specified B group vitamins: Secondary | ICD-10-CM | POA: Diagnosis not present

## 2016-11-26 ENCOUNTER — Ambulatory Visit (INDEPENDENT_AMBULATORY_CARE_PROVIDER_SITE_OTHER): Payer: BC Managed Care – PPO | Admitting: *Deleted

## 2016-11-26 DIAGNOSIS — E538 Deficiency of other specified B group vitamins: Secondary | ICD-10-CM

## 2016-11-30 ENCOUNTER — Ambulatory Visit (INDEPENDENT_AMBULATORY_CARE_PROVIDER_SITE_OTHER): Payer: BC Managed Care – PPO | Admitting: Family Medicine

## 2016-11-30 ENCOUNTER — Encounter: Payer: Self-pay | Admitting: Family Medicine

## 2016-11-30 VITALS — BP 130/68 | HR 80 | Temp 98.1°F | Resp 16 | Ht 65.0 in | Wt 158.0 lb

## 2016-11-30 DIAGNOSIS — M6740 Ganglion, unspecified site: Secondary | ICD-10-CM | POA: Diagnosis not present

## 2016-11-30 NOTE — Progress Notes (Signed)
Subjective:    Patient ID: Sherry Hart, female    DOB: [birth year], 56 y.o.   MRN: [id number]  HPI 03/23/16 Patient has not been seen in quite some time. She is already had her flu shot at her endocrinologist. Her mammogram was done earlier this year as well as her colonoscopy. Her colonoscopy was significant for 1 polyp. They recommended a repeat colonoscopy in 5 years. Otherwise she's been doing well. She recently saw her endocrinologist. Her A1c was 6.9 which was good for this patient. Her LDL cholesterol was below 100. Her renal function and liver function remains stable. However she is long overdue to recheck a CBC. She has a history of pernicious anemia and iron deficiency anemia. She is currently taking B12 injections every month. She stopped her iron supplement  06/16/16 Patient reports 3-4 week history of a cystlike mass on the dorsal surface of the left first IP joint.  Presentation is consistent with a ganglion cyst.  At that time, my plan was:  Offer the patient a referral to a hand surgeon for excision. She would like to try aspiration first. Therefore I prepped the finger in sterile fashion. I anesthetized the skin was 0.1% lidocaine. Using an 18-gauge needle, I aspirated the cyst sac and received clear gelatinous material consistent with a ganglion cyst. I then bandaged, the pressure dressing. Should the cyst return, would recommend excision at a hand surgeon  11/30/16 The cyst return after one month. She is now requesting referral to a hand surgeon for excision. 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 Abdominal pain   .  Anemia   . B12 deficiency   . Biliary colic   . Constipation    occasionally not chronic per pt. -no medicines for this   . Diabetes mellitus 12 yrs ago   . Hyperlipidemia   . Hypertension   . Pernicious anemia   . Unexplained weight loss   . Vomiting   . Weakness    Past Surgical History:  Procedure Laterality Date  . CESAREAN SECTION  11/29/1990, 04/06/1994  . COLONOSCOPY  09/01/2004   all normal - in epic  . KNEE SURGERY  11/2000   right  . MOUTH SURGERY  04/04/1987  . UPPER GASTROINTESTINAL ENDOSCOPY  09-01-04   gastric polyp- m.johnson- in epic  . WISDOM TOOTH EXTRACTION     Current Outpatient Prescriptions on File Prior to Visit  Medication Sig Dispense Refill  . aspirin 81 MG tablet Take 81 mg by mouth daily.    Marland Kitchen atorvastatin (LIPITOR) 10 MG tablet TAKE 1 TABLET (10 MG TOTAL) BY MOUTH DAILY. 90 tablet 1  . Blood Glucose Monitoring Suppl (ONE  TOUCH ULTRA 2) W/DEVICE KIT Use to check blood sugar 3 times per day dx code E11.65 1 each 0  . glimepiride (AMARYL) 1 MG tablet TAKE 1 TABLET WITH SUPPER EVERY DAY 30 tablet 3  . JANUMET XR [id number] MG TB24 TAKE 1 TABLET BY MOUTH EVERY DAY 30 tablet 3  . JARDIANCE 25 MG TABS tablet TAKE 25 MG BY MOUTH DAILY. 90 tablet 1  . Lancets (FREESTYLE) lancets Use as instructed to check blood sugars 3 times per day 100 each 5  . losartan (COZAAR) 100 MG tablet Take 1 tablet (100 mg total) by mouth daily. 30 tablet 4   Current Facility-Administered Medications on File Prior to Visit  Medication Dose Route Frequency Provider Last Rate Last Dose  . cyanocobalamin ((VITAMIN B-12)) injection 1,000 mcg  1,000 mcg Subcutaneous Q30 days Susy Frizzle, MD   1,000 mcg at 09/24/16 1605  . cyanocobalamin ((VITAMIN B-12)) injection 1,000 mcg  1,000 mcg Subcutaneous Q30 days Susy Frizzle, MD   1,000 mcg at 11/26/16 1532   Allergies  Allergen Reactions  . Penicillins Hives    Back of neck and upper back.   Social History   Social History  .  Marital status: Married    Spouse name: N/A  . Number of children: N/A  . Years of education: N/A   Occupational History  . Not on file.   Social History Main Topics  . Smoking status: Never Smoker  . Smokeless tobacco: Never Used  . Alcohol use No  . Drug use: No  . Sexual activity: Not on file   Other Topics Concern  . Not on file   Social History Narrative  . No narrative on file      Review of Systems  All other systems reviewed and are negative.      Objective:   Physical Exam  Constitutional: She is oriented to person, place, and time.  Cardiovascular: Normal rate, regular rhythm, normal heart sounds and intact distal pulses.   Pulmonary/Chest: Effort normal and breath sounds normal.  Musculoskeletal: Normal range of motion. She exhibits no edema or tenderness.  Neurological: She is alert and oriented to person, place, and time. She has normal reflexes. No cranial nerve deficit. She exhibits normal muscle tone. Coordination normal.  Vitals reviewed.   10 mm ganglion cyst on left 1st IP join      Assessment & Plan:  Ganglion cyst - Plan: Ambulatory referral to Hand Surgery Consult hand surgeon.

## 2016-12-01 ENCOUNTER — Other Ambulatory Visit (INDEPENDENT_AMBULATORY_CARE_PROVIDER_SITE_OTHER): Payer: BC Managed Care – PPO

## 2016-12-01 DIAGNOSIS — E1165 Type 2 diabetes mellitus with hyperglycemia: Secondary | ICD-10-CM | POA: Diagnosis not present

## 2016-12-01 LAB — LIPID PANEL
CHOL/HDL RATIO: 4
Cholesterol: 165 mg/dL (ref 0–200)
HDL: 41.6 mg/dL (ref >39.00)
LDL Cholesterol: 102 mg/dL — ABNORMAL HIGH (ref 0–99)
NONHDL: 123.21
Triglycerides: 104 mg/dL (ref 0.0–149.0)
VLDL: 20.8 mg/dL (ref 0.0–40.0)

## 2016-12-01 LAB — COMPREHENSIVE METABOLIC PANEL
ALK PHOS: 77 U/L (ref 39–117)
ALT: 16 U/L (ref 0–35)
AST: 20 U/L (ref 0–37)
Albumin: 4.3 g/dL (ref 3.5–5.2)
BILIRUBIN TOTAL: 0.5 mg/dL (ref 0.2–1.2)
BUN: 22 mg/dL (ref 6–23)
CO2: 27 meq/L (ref 19–32)
Calcium: 10 mg/dL (ref 8.4–10.5)
Chloride: 103 mEq/L (ref 96–112)
Creatinine, Ser: 1.19 mg/dL (ref 0.40–1.20)
GFR: 60.16 mL/min (ref >60.00)
GLUCOSE: 152 mg/dL — AB (ref 70–99)
POTASSIUM: 4.6 meq/L (ref 3.5–5.1)
Sodium: 137 mEq/L (ref 135–145)
TOTAL PROTEIN: 8 g/dL (ref 6.0–8.3)

## 2016-12-01 LAB — HEMOGLOBIN A1C: HEMOGLOBIN A1C: 8.3 % — AB (ref 4.6–6.5)

## 2016-12-03 NOTE — Progress Notes (Signed)
Patient ID: Sherry Hart, female   DOB: 1961/02/01, 56 y.o.   MRN: [id number]   Reason for Appointment : Followup of diabetes   History of Present Illness          Problem 1: Type 2 diabetes mellitus, date of diagnosis: 2002       Past history: She had been treated with metformin since diagnosis and previous records are not available for review. She thinks that when she was taking her blood sugar regularly there were usually below 150 in the morning In 2012 because of lack of insurance she did not take her medications or check her sugar for about a year In 6/13 she was evaluated in the emergency room for abdominal pain and was found to have an A1c of 14% with very high blood sugars. Her only symptoms were weight loss. She was given Lantus, unknown dose for about 2 months but she went off this on her own since she did not want to continue this while going to work. Also was presumably given glipizide at that time but no details available She was seen for initial consultation in 1/15 and at that time her glucose was 431 with A1c of 13.1 Initially treated with insulin but this was later replaced with oral agents She started taking Kombiglyze XR on 08/08/13 and had gone up to 2 tablets daily Because of tendency to relatively higher fasting readings she was given low-dose Amaryl in 3/15 in addition  Recent history:  Oral hypoglycemic drugs the patient is taking are:  Glimeperide 0.5 mg acs, Janumet XR 100/1000 daily, Jardiance 25 mg at dinner  Her blood sugars are showing progressively worsening control with A1c now 8.3 compared to 7.6   Current blood sugars at home and management:  She did not bring her blood sugar monitor for download again  She thinks blood sugars are not excessively high but her A1c is indicating an average of nearly 200, she usually checks infrequently  She may have higher readings after meals generally does not check these readings  Fasting readings are  mildly increased and fairly consistently, lab glucose 152  Her weight is about the same  She is trying to walk at times, usually not able to walk much when she is working   Side effects from medications have been:  GI side effects from 2000 mg metformin ER  Glucose monitoring:  done infrequently, mostly in the morning        Glucometer: Freestyle    Blood Glucose readings by recall  Mean values apply above for all meters except median for One Touch  PRE-MEAL Fasting Lunch Dinner Bedtime Overall  Glucose range: 139-159 102  179   Mean/median:         Glycemic control:   Lab Results  Component Value Date   HGBA1C 8.3 (H) 12/01/2016   HGBA1C 7.6 (H) 09/02/2016   HGBA1C 6.9 (H) 03/03/2016   Lab Results  Component Value Date   MICROALBUR 1.1 09/02/2016   LDLCALC 102 (H) 12/01/2016   CREATININE 1.19 12/01/2016   Self-care: The diet that the patient has been following is:  usually low fat, low carbs, Variable.   Breakfast may be fruit , occasionally cereal or peanut butter crackers Usually has fruit for snacks   Avoiding drinks with sugar, dinner is at 5-7 pm            Exercise: some walking recently    Dietician visit: Most recent: 2002 attended a class  Compliance with the medical regimen: fair  Weight history: 125 upto 173, her lowest weight was in 11/2011  Wt Readings from Last 3 Encounters:  12/04/16 154 lb 12.8 oz (70.2 kg)  11/30/16 158 lb (71.7 kg)  09/08/16 153 lb (69.4 kg)   Lab on 12/01/2016  Component Date Value Ref Range Status  . Hgb A1c MFr Bld 12/01/2016 8.3* 4.6 - 6.5 % Final   Glycemic Control Guidelines for People with Diabetes:Non Diabetic:  <6%Goal of Therapy: <7%Additional Action Suggested:  >8%   . Sodium 12/01/2016 137  135 - 145 mEq/L Final  . Potassium 12/01/2016 4.6  3.5 - 5.1 mEq/L Final  . Chloride 12/01/2016 103  96 - 112 mEq/L Final  . CO2 12/01/2016 27  19 - 32 mEq/L Final  . Glucose, Bld 12/01/2016 152* 70 - 99 mg/dL  Final  . BUN 12/01/2016 22  6 - 23 mg/dL Final  . Creatinine, Ser 12/01/2016 1.19  0.40 - 1.20 mg/dL Final  . Total Bilirubin 12/01/2016 0.5  0.2 - 1.2 mg/dL Final  . Alkaline Phosphatase 12/01/2016 77  39 - 117 U/L Final  . AST 12/01/2016 20  0 - 37 U/L Final  . ALT 12/01/2016 16  0 - 35 U/L Final  . Total Protein 12/01/2016 8.0  6.0 - 8.3 g/dL Final  . Albumin 12/01/2016 4.3  3.5 - 5.2 g/dL Final  . Calcium 12/01/2016 10.0  8.4 - 10.5 mg/dL Final  . GFR 12/01/2016 60.16  >60.00 mL/min Final  . Cholesterol 12/01/2016 165  0 - 200 mg/dL Final   ATP III Classification       Desirable:  < 200 mg/dL               Borderline High:  200 - 239 mg/dL          High:  > = 240 mg/dL  . Triglycerides 12/01/2016 104.0  0.0 - 149.0 mg/dL Final   Normal:  <150 mg/dLBorderline High:  150 - 199 mg/dL  . HDL 12/01/2016 41.60  >39.00 mg/dL Final  . VLDL 12/01/2016 20.8  0.0 - 40.0 mg/dL Final  . LDL Cholesterol 12/01/2016 102* 0 - 99 mg/dL Final  . Total CHOL/HDL Ratio 12/01/2016 4   Final                  Men          Women1/2 Average Risk     3.4          3.3Average Risk          5.0          4.42X Average Risk          9.6          7.13X Average Risk          15.0          11.0                      . NonHDL 12/01/2016 123.21   Final   NOTE:  Non-HDL goal should be 30 mg/dL higher than patient's LDL goal (i.e. LDL goal of < 70 mg/dL, would have non-HDL goal of < 100 mg/dL)     HYPERTENSION: Treatment reviewed in review of systems   Allergies as of 12/04/2016      Reactions   Penicillins Hives   Back of neck and upper back.      Medication List  Accurate as of 12/04/16  8:19 AM. Always use your most recent med list.          aspirin 81 MG tablet Take 81 mg by mouth daily.   atorvastatin 10 MG tablet Commonly known as:  LIPITOR TAKE 1 TABLET (10 MG TOTAL) BY MOUTH DAILY.   Dulaglutide 0.75 MG/0.5ML Sopn Commonly known as:  TRULICITY Inject in the abdominal skin as directed once a  week   freestyle lancets Use as instructed to check blood sugars 3 times per day   glimepiride 1 MG tablet Commonly known as:  AMARYL TAKE 1 TABLET WITH SUPPER EVERY DAY   JANUMET XR [phone] MG Tb24 Generic drug:  SitaGLIPtin-MetFORMIN HCl TAKE 1 TABLET BY MOUTH EVERY DAY   JARDIANCE 25 MG Tabs tablet Generic drug:  empagliflozin TAKE 25 MG BY MOUTH DAILY.   losartan 100 MG tablet Commonly known as:  COZAAR Take 1 tablet (100 mg total) by mouth daily.   ONE TOUCH ULTRA 2 w/Device Kit Use to check blood sugar 3 times per day dx code E11.65       Allergies:  Allergies  Allergen Reactions  . Penicillins Hives    Back of neck and upper back.    Past Medical History:  Diagnosis Date  . Abdominal pain   . Anemia   . B12 deficiency   . Biliary colic   . Constipation    occasionally not chronic per pt. -no medicines for this   . Diabetes mellitus 12 yrs ago   . Hyperlipidemia   . Hypertension   . Pernicious anemia   . Unexplained weight loss   . Vomiting   . Weakness     Past Surgical History:  Procedure Laterality Date  . CESAREAN SECTION  11/29/1990, 04/06/1994  . COLONOSCOPY  09/01/2004   all normal - in epic  . KNEE SURGERY  11/2000   right  . MOUTH SURGERY  04/04/1987  . UPPER GASTROINTESTINAL ENDOSCOPY  09-01-04   gastric polyp- m.johnson- in epic  . WISDOM TOOTH EXTRACTION      Family History  Problem Relation Age of Onset  . Cancer Mother        breast  . Breast cancer Mother   . Diabetes Father   . Thyroid disease Sister   . Hypertension Neg Hx   . Colon cancer Neg Hx   . Colon polyps Neg Hx   . Rectal cancer Neg Hx   . Stomach cancer Neg Hx     Social History:  reports that she has never smoked. She has never used smokeless tobacco. She reports that she does not drink alcohol or use drugs.    Review of Systems     HYPERTENSION: blood pressure has been treated with Cozaar 146m   Her blood pressure tends to be higher in the office At  times   BP Readings from Last 3 Encounters:  12/04/16 112/84  11/30/16 130/68  09/08/16 130/70     Lipids:   She has been taking Lipitor that was prescribed in 12/2013  However LDL is higher than before, not clear if she is consistent with her diet   Lab Results  Component Value Date   CHOL 165 12/01/2016   HDL 41.60 12/01/2016   LDLCALC 102 (H) 12/01/2016   LDLDIRECT 175.6 07/28/2013   TRIG 104.0 12/01/2016   CHOLHDL 4 12/01/2016    Diabetic foot exam done in 6/17  Eye exam was normal in 3/17  She has vitamin B-12  deficiency and is on injectable treatment monthly from PCP   Physical Examination:  BP 112/84   Pulse 87   Ht _0  (1.651 m)   Wt 154 lb 12.8 oz (70.2 kg)   SpO2 98%   BMI 25.76 kg/m     ASSESSMENT/PLAN:   Diabetes type 2 See history of present illness for detailed discussion of  current management, blood sugar patterns and problems identified  She has had her A1c progressively the level being 8.3, previously has been below 7 This is with a multiple drug regimen of Janumet XR, Jardiance 25 mg and low-dose Amaryl Does not appear to have any significant changes in her diet or exercise level as well as her weight Most likely getting is some progression of her diabetes Also not clear how often she is checking her sugars and they're probably both high fasting and postprandial  Discussed need to use a more effective medication for better control and also review her meal planning and blood sugars more closely She is a good candidate for a GLP-1 drug  Discussed with the patient the nature of GLP-1 drugs, the action on various organ systems, how they benefit blood glucose control, as well as the benefit of weight loss and  increase satiety . Explained possible side effects, particularly nausea and vomiting that usually resolve over time; discussed safety information in package insert. Demonstrated the medication injection device and injection technique to  the patient.  Showed patient where to inject the medication. To start with 0.75 mg dosage weekly for the first 4 weeks Patient brochure on Trulicity with enclosed co-pay card given  Discussed timing and frequency of glucose monitoring, need to bring monitor for download on each visit She needs to exercise regularly Within the first week she may be able to to stop Amaryl as she is only taking a small dose once Trulicity is effective She will continue Jardiance When she finishes Janumet she can switch to metformin Other questions related to diabetes management and medications were answered  Hypertension: Blood pressure is controlled, she needs to continue periodically checking at home  LIPIDS: Discussed needs to cut back on saturated fats from dairy products and meats as LDL is going up without change in medication noncompliance with this  Patient Instructions  Start TRULICITYwith the pen as shown once weekly on the same day of the week.  You may inject in the stomach, thigh or arm as indicated in the brochure given.   You will feel fullness of the stomach with starting the medication and should try to keep the portions at meals small.   You may experience nausea in the first few days which usually gets better over time    If any questions or concerns are present call the office or the  [facility] at [phone]. Also visit [email or web address] website for more useful information  Check blood sugars on waking up  2-3/7  Also check blood sugars about 2 hours after a meal and do this after different meals by rotation  Recommended blood sugar levels on waking up is 90-130 and about 2 hours after meal is 130-160  Please bring your blood sugar monitor to each visit, thank you      Counseling time on subjects discussed above is over 50% of today's 25 minute visit    Delania Ferg 12/04/2016, 8:19 AM   Note: This office note was prepared with Dragon voice recognition system  technology. Any transcriptional errors that result from this process are  unintentional.

## 2016-12-04 ENCOUNTER — Ambulatory Visit (INDEPENDENT_AMBULATORY_CARE_PROVIDER_SITE_OTHER): Payer: BC Managed Care – PPO | Admitting: Endocrinology

## 2016-12-04 ENCOUNTER — Encounter: Payer: Self-pay | Admitting: Endocrinology

## 2016-12-04 ENCOUNTER — Other Ambulatory Visit: Payer: BC Managed Care – PPO

## 2016-12-04 VITALS — BP 112/84 | HR 87 | Ht 65.0 in | Wt 154.8 lb

## 2016-12-04 DIAGNOSIS — I10 Essential (primary) hypertension: Secondary | ICD-10-CM | POA: Diagnosis not present

## 2016-12-04 DIAGNOSIS — E7800 Pure hypercholesterolemia, unspecified: Secondary | ICD-10-CM

## 2016-12-04 DIAGNOSIS — E1165 Type 2 diabetes mellitus with hyperglycemia: Secondary | ICD-10-CM | POA: Diagnosis not present

## 2016-12-04 MED ORDER — DULAGLUTIDE 0.75 MG/0.5ML ~~LOC~~ SOAJ: SUBCUTANEOUS | 0 refills | Status: DC

## 2016-12-04 NOTE — Patient Instructions (Signed)
Start TRULICITYwith the pen as shown once weekly on the same day of the week.  You may inject in the stomach, thigh or arm as indicated in the brochure given.   You will feel fullness of the stomach with starting the medication and should try to keep the portions at meals small.   You may experience nausea in the first few days which usually gets better over time    If any questions or concerns are present call the office or the  [facility] at [phone]. Also visit [email or web address] website for more useful information  Check blood sugars on waking up  2-3/7  Also check blood sugars about 2 hours after a meal and do this after different meals by rotation  Recommended blood sugar levels on waking up is 90-130 and about 2 hours after meal is 130-160  Please bring your blood sugar monitor to each visit, thank you

## 2016-12-09 ENCOUNTER — Ambulatory Visit: Payer: BC Managed Care – PPO | Admitting: Endocrinology

## 2016-12-10 DIAGNOSIS — M79644 Pain in right finger(s): Secondary | ICD-10-CM | POA: Insufficient documentation

## 2016-12-10 DIAGNOSIS — M6740 Ganglion, unspecified site: Secondary | ICD-10-CM | POA: Insufficient documentation

## 2016-12-25 ENCOUNTER — Other Ambulatory Visit: Payer: Self-pay

## 2016-12-28 ENCOUNTER — Ambulatory Visit (INDEPENDENT_AMBULATORY_CARE_PROVIDER_SITE_OTHER): Payer: BC Managed Care – PPO

## 2016-12-28 DIAGNOSIS — E538 Deficiency of other specified B group vitamins: Secondary | ICD-10-CM

## 2016-12-28 NOTE — Progress Notes (Signed)
Patient received b12 in right arm subcutaneous.Patient tolerated well

## 2016-12-29 ENCOUNTER — Other Ambulatory Visit: Payer: Self-pay | Admitting: Endocrinology

## 2016-12-30 ENCOUNTER — Other Ambulatory Visit: Payer: Self-pay | Admitting: Endocrinology

## 2016-12-30 DIAGNOSIS — E1165 Type 2 diabetes mellitus with hyperglycemia: Secondary | ICD-10-CM

## 2017-01-01 ENCOUNTER — Encounter: Payer: BC Managed Care – PPO | Attending: Endocrinology | Admitting: Dietician

## 2017-01-01 ENCOUNTER — Encounter: Payer: Self-pay | Admitting: Dietician

## 2017-01-01 DIAGNOSIS — Z6825 Body mass index (BMI) 25.0-25.9, adult: Secondary | ICD-10-CM | POA: Insufficient documentation

## 2017-01-01 DIAGNOSIS — Z713 Dietary counseling and surveillance: Secondary | ICD-10-CM | POA: Insufficient documentation

## 2017-01-01 DIAGNOSIS — IMO0002 Reserved for concepts with insufficient information to code with codable children: Secondary | ICD-10-CM

## 2017-01-01 DIAGNOSIS — E118 Type 2 diabetes mellitus with unspecified complications: Secondary | ICD-10-CM

## 2017-01-01 DIAGNOSIS — E1165 Type 2 diabetes mellitus with hyperglycemia: Secondary | ICD-10-CM | POA: Diagnosis not present

## 2017-01-01 NOTE — Progress Notes (Signed)
Diabetes Self-Management Education  Visit Type: First/Initial  Appt. Start Time: 26 (late) Appt. End Time: 1010  01/01/2017  [name], identified by name and date of birth, is a 56 y.o. female with a diagnosis of Diabetes: Type 2. Other hx includes HTN, high cholesterol, vitamin B-12 deficiency.  Her weight is stable.  Lowest weight 125 lbs years ago and highest weight 173 lbs.  Medications include Lipitor, Vitamin B-12 injections, glimepiride, Trulicity, Jardiance, Janumet.    Patient lives with her husband.  Her adult son and daughter are also there.  Her daughter and her share the shopping and cooking.  Her daughter eats healthfully.  Her husband is a Administrator and is out of town often and eats much differently.  She is working as a Optometrist long hours at times.  She does not feel that she has time or energy to exercise when she is working and will only start on school breaks.  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    ASSESSMENT  Height 5\' 5"  (1.651 m), weight 153 lb (69.4 kg). Body mass index is 25.46 kg/m.      Diabetes Self-Management Education - 01/01/17 0834      Visit Information   Visit Type First/Initial     Initial Visit   Diabetes Type Type 2   Are you currently following a meal plan? Yes   What type of meal plan do you follow? increased fiber, protein, and carbohydrate modified   Are you taking your medications as prescribed? Yes   Date Diagnosed 2002     Health Coping   How would you rate your overall health? Good     Psychosocial Assessment   Patient Belief/Attitude about Diabetes Motivated to manage diabetes   Self-care barriers None   Self-management support Doctor's office;Family   Other persons present Patient   Patient Concerns Nutrition/Meal planning;Glycemic Control   Special Needs None   Preferred Learning Style No preference indicated   Learning Readiness Ready   How often do you need  to have someone help you when you read instructions, pamphlets, or other written materials from your doctor or pharmacy? 1 - Never   What is the last grade level you completed in school? 4 years college     Pre-Education Assessment   Patient understands the diabetes disease and treatment process. Needs Review   Patient understands incorporating nutritional management into lifestyle. Needs Review   Patient undertands incorporating physical activity into lifestyle. Needs Review   Patient understands using medications safely. Needs Review   Patient understands monitoring blood glucose, interpreting and using results Needs Review   Patient understands prevention, detection, and treatment of acute complications. Needs Review   Patient understands prevention, detection, and treatment of chronic complications. Needs Review   Patient understands how to develop strategies to address psychosocial issues. Needs Review   Patient understands how to develop strategies to promote health/change behavior. Needs Review     Complications   Last HgB A1C per patient/outside source 8.3 %  12/01/16 increased from 7.6% 09/05/16   How often do you check your blood sugar? 0 times/day (not testing)  thumb surgery and was unable to manipulate the lancing device but will restart this week   Number of hypoglycemic episodes per month 3  she thinks but was unable to test   Can you tell when your blood sugar is low? Yes   What do you do if your blood sugar  is low? eat a piece of fruit or peppermints   Have you had a dilated eye exam in the past 12 months? Yes   Have you had a dental exam in the past 12 months? Yes   Are you checking your feet? No     Dietary Intake   Breakfast cheerios, 1% milk, occasional bananana OR plain instant oatmeal and milk OR egg and toast OR OR PB and toast OR banana   Snack (morning) banana or sliced apples occasionally and usually if no breakfast at home   Lunch homemade PB crackers (4),  apple, nutrigrain bar and occasional peanuts OR school cafeteria   Snack (afternoon) none or apple or banana or handful of chips OR cheese dip, salsa and tortilla chips   Dinner salmon, asparagus, potatoes OR chicken, potatoes,, vegetables OR [employer] spaghetti with lean meat sauce OR grilled ribs or steak and vegetables  6-8   Snack (evening) none   Beverage(s) water, crystal light, coolade (makes with less sugar) and diluted, rare sweet tea, occasional diet soda     Exercise   Exercise Type Light (walking / raking leaves)  walking in park, walk away the pounds video, stationary bike   How many days per week to you exercise? 5   How many minutes per day do you exercise? 45   Total minutes per week of exercise 225     Patient Education   Previous Diabetes Education Yes (please comment)  when diagnosed   Disease state  Definition of diabetes, type 1 and 2, and the diagnosis of diabetes  review   Nutrition management  Role of diet in the treatment of diabetes and the relationship between the three main macronutrients and blood glucose level;Food label reading, portion sizes and measuring food.;Meal options for control of blood glucose level and chronic complications.;Information on hints to eating out and maintain blood glucose control.   Physical activity and exercise  Role of exercise on diabetes management, blood pressure control and cardiac health.   Medications Reviewed patients medication for diabetes, action, purpose, timing of dose and side effects.   Monitoring Identified appropriate SMBG and/or A1C goals.;Daily foot exams;Yearly dilated eye exam   Acute complications Taught treatment of hypoglycemia - the 15 rule.   Psychosocial adjustment Worked with patient to identify barriers to care and solutions;Role of stress on diabetes;Identified and addressed patients feelings and concerns about diabetes   Personal strategies to promote health Lifestyle issues that need to be addressed for better  diabetes care     Individualized Goals (developed by patient)   Nutrition General guidelines for healthy choices and portions discussed   Physical Activity Exercise 5-7 days per week;30 minutes per day   Medications take my medication as prescribed   Monitoring  test my blood glucose as discussed   Problem Solving working exercise into life   Reducing Risk examine blood glucose patterns;do foot checks daily;increase portions of nuts and seeds   Health Coping discuss diabetes with (comment)  MD/RD     Post-Education Assessment   Patient understands the diabetes disease and treatment process. Needs Review   Patient understands incorporating nutritional management into lifestyle. Needs Review   Patient undertands incorporating physical activity into lifestyle. Needs Review   Patient understands using medications safely. Needs Review   Patient understands monitoring blood glucose, interpreting and using results Needs Review   Patient understands prevention, detection, and treatment of acute complications. Needs Review   Patient understands prevention, detection, and treatment of chronic complications.  Needs Review   Patient understands how to develop strategies to address psychosocial issues. Needs Review   Patient understands how to develop strategies to promote health/change behavior. Needs Review     Outcomes   Expected Outcomes Demonstrated interest in learning. Expect positive outcomes   Future DMSE PRN   Program Status Completed      Individualized Plan for Diabetes Self-Management Training:   Learning Objective:  Patient will have a greater understanding of diabetes self-management. Patient education plan is to attend individual and/or group sessions per assessed needs and concerns.   Plan:   Patient Instructions  Be active most days.  Aim for 30 minutes. Read the labels for portion size and amount of carbohydrate. Choose foods that are not processed most often. Increase  non starchy vegetable intake. Have a small portion of protein with each meal and snack.  Aim for 3 Carb Choices per meal (45 grams) +/- 1 either way  Aim for 0-1 Carbs per snack if hungry  Resume checking your blood sugar.  Take your meter with you so you can check if your blood sugar is low.  Per Dr. Ronnie Derby note:  When you are finished with the Janumet, stop taking this and begin Metformin.  Cookbook:  The [place] for People with Diabetes by [employer], RD and Rocky Crafts, MS, RD (from the American Diabetes Association)   Expected Outcomes:  Demonstrated interest in learning. Expect positive outcomes  Education material provided: Living Well with Diabetes, Food label handouts, A1C conversion sheet, Meal plan card, My Plate and Snack sheet, Type 2 Diabetes and your feet, Diabetes Self Management magazine, Dining out with diabetes  If problems or questions, patient to contact team via:  Phone  Future DSME appointment: PRN

## 2017-01-01 NOTE — Patient Instructions (Addendum)
Be active most days.  Aim for 30 minutes. Read the labels for portion size and amount of carbohydrate. Choose foods that are not processed most often. Increase non starchy vegetable intake. Have a small portion of protein with each meal and snack.  Aim for 3 Carb Choices per meal (45 grams) +/- 1 either way  Aim for 0-1 Carbs per snack if hungry  Resume checking your blood sugar.  Take your meter with you so you can check if your blood sugar is low.  Per Dr. Ronnie Derby note:  When you are finished with the Janumet, stop taking this and begin Metformin.  Cookbook:  The [place] for People with Diabetes by [employer], RD and Rocky Crafts, MS, RD (from the American Diabetes Association)

## 2017-01-06 ENCOUNTER — Other Ambulatory Visit (INDEPENDENT_AMBULATORY_CARE_PROVIDER_SITE_OTHER): Payer: BC Managed Care – PPO

## 2017-01-06 DIAGNOSIS — E1165 Type 2 diabetes mellitus with hyperglycemia: Secondary | ICD-10-CM

## 2017-01-06 LAB — GLUCOSE, RANDOM: GLUCOSE: 124 mg/dL — AB (ref 70–99)

## 2017-01-07 ENCOUNTER — Other Ambulatory Visit: Payer: Self-pay | Admitting: Endocrinology

## 2017-01-07 LAB — FRUCTOSAMINE: Fructosamine: 289 umol/L — ABNORMAL HIGH (ref 0–285)

## 2017-01-08 ENCOUNTER — Other Ambulatory Visit: Payer: Self-pay | Admitting: Endocrinology

## 2017-01-12 ENCOUNTER — Ambulatory Visit (INDEPENDENT_AMBULATORY_CARE_PROVIDER_SITE_OTHER): Payer: BC Managed Care – PPO | Admitting: Endocrinology

## 2017-01-12 ENCOUNTER — Encounter: Payer: Self-pay | Admitting: Endocrinology

## 2017-01-12 VITALS — BP 136/82 | HR 89 | Ht 65.0 in | Wt 155.0 lb

## 2017-01-12 DIAGNOSIS — E1165 Type 2 diabetes mellitus with hyperglycemia: Secondary | ICD-10-CM

## 2017-01-12 NOTE — Progress Notes (Signed)
Patient ID: Sherry Hart, female   DOB: [birth year], 56 y.o.   MRN: [id number]   Reason for Appointment : Followup of diabetes   History of Present Illness          Problem 1: Type 2 diabetes mellitus, date of diagnosis: 2002       Past history: She had been treated with metformin since diagnosis and previous records are not available for review. She thinks that when she was taking her blood sugar regularly there were usually below 150 in the morning In 2012 because of lack of insurance she did not take her medications or check her sugar for about a year In 6/13 she was evaluated in the emergency room for abdominal pain and was found to have an A1c of 14% with very high blood sugars. Her only symptoms were weight loss. She was given Lantus, unknown dose for about 2 months but she went off this on her own since she did not want to continue this while going to work. Also was presumably given glipizide at that time but no details available She was seen for initial consultation in 1/15 and at that time her glucose was 431 with A1c of 13.1 Initially treated with insulin but this was later replaced with oral agents She started taking Kombiglyze XR on 08/08/13 and had gone up to 2 tablets daily Because of tendency to relatively higher fasting readings she was given low-dose Amaryl in 3/15 in addition  Recent history:   Oral hypoglycemic drugs the patient is taking are:  Glimeperide 1 mg acs, Janumet XR 100/1000 daily, Jardiance 25 mg at dinner  She has been on Trulicity 2.56 mg weekly since 12/05/16  Prior to this her A1c was 8.3 Her fructosamine is 289   Current blood sugars at home and management:  She did have some nausea initially when starting Trulicity for a week or 2 but she is well adjusted to this now  She does find that she is smaller portions with this  However has not lost any weight  She is again checking blood sugars very sporadically  She has a couple of  fasting readings of 101 and 114 and nonfasting readings are generally lower  Lowest blood sugar was 47 when she had an early dinner and took her Jardiance and Amaryl without much food in the evening  Lab glucose was 124 fasting  Her glucose readings prior to starting Trulicity were about 389 the morning and as high as 179 postprandial  She is trying to walk fairly regularly   Side effects from medications have been:  GI side effects from 2000 mg metformin ER  Glucose monitoring:  done infrequently, mostly in the morning        Glucometer: Freestyle    Blood Glucose readings by download as above, overall average of 88 in the last 2 weeks    Glycemic control:   Lab Results  Component Value Date   HGBA1C 8.3 (H) 12/01/2016   HGBA1C 7.6 (H) 09/02/2016   HGBA1C 6.9 (H) 03/03/2016   Lab Results  Component Value Date   MICROALBUR 1.1 09/02/2016   LDLCALC 102 (H) 12/01/2016   CREATININE 1.19 12/01/2016   Self-care: The diet that the patient has been following is:  usually low fat, low carbs, Variable.   Breakfast may be  occasionally cereal or peanut butter crackers Usually has fruit for snacks   Avoiding drinks with sugar, dinner is at 5-7 pm  Exercise: She is trying to do some walking more regularly     Dietician visit: Most recent:  01/01/17              Compliance with the medical regimen: fair  Weight history: 125 upto 173, her lowest weight was in 11/2011  Wt Readings from Last 3 Encounters:  01/12/17 155 lb (70.3 kg)  01/01/17 153 lb (69.4 kg)  12/04/16 154 lb 12.8 oz (70.2 kg)   Lab on 01/06/2017  Component Date Value Ref Range Status  . Fructosamine 01/06/2017 289* 0 - 285 umol/L Final   Comment: Published reference interval for apparently healthy subjects between age 62 and 9 is 85 - 285 umol/L and in a poorly controlled diabetic population is 228 - 563 umol/L with a mean of 396 umol/L.   Marland Kitchen Glucose, Bld 01/06/2017 124* 70 - 99 mg/dL Final   Other  active problems: See review of systems     Allergies as of 01/12/2017      Reactions   Penicillins Hives   Back of neck and upper back.      Medication List       Accurate as of 01/12/17  9:13 PM. Always use your most recent med list.          aspirin 81 MG tablet Take 81 mg by mouth daily.   atorvastatin 10 MG tablet Commonly known as:  LIPITOR TAKE 1 TABLET (10 MG TOTAL) BY MOUTH DAILY.   freestyle lancets Use as instructed to check blood sugars 3 times per day   glimepiride 1 MG tablet Commonly known as:  AMARYL TAKE 1 TABLET WITH SUPPER EVERY DAY   JANUMET XR [phone] MG Tb24 Generic drug:  SitaGLIPtin-MetFORMIN HCl TAKE 1 TABLET BY MOUTH EVERY DAY   JARDIANCE 25 MG Tabs tablet Generic drug:  empagliflozin TAKE 25 MG BY MOUTH DAILY.   losartan 100 MG tablet Commonly known as:  COZAAR Take 1 tablet (100 mg total) by mouth daily.   ONE TOUCH ULTRA 2 w/Device Kit Use to check blood sugar 3 times per day dx code E11.65   TRULICITY 0.75 MG/0.5ML Sopn Generic drug:  Dulaglutide INJECT IN THE ABDOMINAL SKIN AS DIRECTED ONCE A WEEK       Allergies:  Allergies  Allergen Reactions  . Penicillins Hives    Back of neck and upper back.    Past Medical History:  Diagnosis Date  . Abdominal pain   . Anemia   . B12 deficiency   . Biliary colic   . Constipation    occasionally not chronic per pt. -no medicines for this   . Diabetes mellitus 12 yrs ago   . Hyperlipidemia   . Hypertension   . Pernicious anemia   . Unexplained weight loss   . Vomiting   . Weakness     Past Surgical History:  Procedure Laterality Date  . CESAREAN SECTION  11/29/1990, 04/06/1994  . COLONOSCOPY  09/01/2004   all normal - in epic  . KNEE SURGERY  11/2000   right  . MOUTH SURGERY  04/04/1987  . UPPER GASTROINTESTINAL ENDOSCOPY  09-01-04   gastric polyp- m.johnson- in epic  . WISDOM TOOTH EXTRACTION      Family History  Problem Relation Age of Onset  . Cancer Mother          breast  . Breast cancer Mother   . Diabetes Father   . Thyroid disease Sister   . Hypertension Neg Hx   .  Colon cancer Neg Hx   . Colon polyps Neg Hx   . Rectal cancer Neg Hx   . Stomach cancer Neg Hx     Social History:  reports that she has never smoked. She has never used smokeless tobacco. She reports that she does not drink alcohol or use drugs.    Review of Systems     HYPERTENSION: blood pressure has been treated with Cozaar 155m   Her blood pressure tends to be higher in the office at times It appears to be well controlled consistently now  BP Readings from Last 3 Encounters:  01/12/17 136/82  12/04/16 112/84  11/30/16 130/68     Lipids:   She has been taking Lipitor that was prescribed in 12/2013  Last LDL 102    Lab Results  Component Value Date   CHOL 165 12/01/2016   HDL 41.60 12/01/2016   LDLCALC 102 (H) 12/01/2016   LDLDIRECT 175.6 07/28/2013   TRIG 104.0 12/01/2016   CHOLHDL 4 12/01/2016    Diabetic foot exam done in 6/17  Eye exam was normal in 3/17  She has vitamin B-12 deficiency and is on injectable treatment monthly from PCP   Physical Examination:  BP 136/82   Pulse 89   Ht _0  (1.651 m)   Wt 155 lb (70.3 kg)   SpO2 98%   BMI 25.79 kg/m     ASSESSMENT/PLAN:   Diabetes type 2 See history of present illness for  discussion of  current management, blood sugar patterns and problems identified  She appears to be doing well with adding Trulicity to her previous regimen Now tolerating this well after initial period of nausea Fructosamine is 289 which compares well to her baseline A1c of 8.3 She did have one episode of hypoglycemia mostly related to change in her meal routine and taking evening medications without significant amount of food However since she may tend to get hypoglycemia with continuing use of Trulicity will have her take only half tablet of glimepiride If she is continues to have low normal or low sugars she  can stop this  Currently since she is tolerating metformin the form of Janumet fairly well will continue this also A1c to be done in 2 months  Total visit time = 15 minutes   Patient Instructions  Take 1/2 Glimeperide and if still getting low stop    Sherry Hart 01/12/2017, 9:13 PM   Note: This office note was prepared with Dragon voice recognition system technology. Any transcriptional errors that result from this process are unintentional.

## 2017-01-12 NOTE — Patient Instructions (Addendum)
Take 1/2 Glimeperide and if still getting low stop

## 2017-01-14 LAB — HM DIABETES EYE EXAM

## 2017-01-15 ENCOUNTER — Other Ambulatory Visit: Payer: Self-pay

## 2017-01-15 MED ORDER — GLIMEPIRIDE 1 MG PO TABS: ORAL_TABLET | ORAL | 2 refills | Status: DC

## 2017-01-29 ENCOUNTER — Ambulatory Visit (INDEPENDENT_AMBULATORY_CARE_PROVIDER_SITE_OTHER): Payer: BC Managed Care – PPO

## 2017-01-29 DIAGNOSIS — D519 Vitamin B12 deficiency anemia, unspecified: Secondary | ICD-10-CM

## 2017-01-29 DIAGNOSIS — E538 Deficiency of other specified B group vitamins: Secondary | ICD-10-CM

## 2017-01-29 NOTE — Progress Notes (Signed)
Patient received b12 injection in right arm. Patient tolerated well

## 2017-02-01 ENCOUNTER — Telehealth: Payer: Self-pay | Admitting: Endocrinology

## 2017-02-01 ENCOUNTER — Other Ambulatory Visit: Payer: Self-pay

## 2017-02-01 MED ORDER — DULAGLUTIDE 0.75 MG/0.5ML ~~LOC~~ SOAJ: SUBCUTANEOUS | 4 refills | Status: DC

## 2017-02-01 NOTE — Telephone Encounter (Signed)
Patient called in reference to taking last trulicity pen on Sunday 01/31/17. Patient is unsure if she is suppose to get a refill. Please call patient and advise. OK to leave message.

## 2017-02-01 NOTE — Telephone Encounter (Signed)
Continue same dose of Trulicity

## 2017-02-01 NOTE — Telephone Encounter (Signed)
Called patient and left a voice message to let her know to continue her Trulicity and that I have sent in a refill in to [facility] [address].

## 2017-02-01 NOTE — Telephone Encounter (Signed)
See below . Thanks

## 2017-02-01 NOTE — Telephone Encounter (Signed)
I see a note in last office visit. Please confirm if patient is to continue on Trulicity so I can refill for her.

## 2017-03-01 ENCOUNTER — Other Ambulatory Visit: Payer: BC Managed Care – PPO

## 2017-03-01 ENCOUNTER — Ambulatory Visit (INDEPENDENT_AMBULATORY_CARE_PROVIDER_SITE_OTHER): Payer: BC Managed Care – PPO | Admitting: Family Medicine

## 2017-03-01 DIAGNOSIS — E538 Deficiency of other specified B group vitamins: Secondary | ICD-10-CM | POA: Diagnosis not present

## 2017-03-11 ENCOUNTER — Other Ambulatory Visit (INDEPENDENT_AMBULATORY_CARE_PROVIDER_SITE_OTHER): Payer: BC Managed Care – PPO

## 2017-03-11 DIAGNOSIS — E1165 Type 2 diabetes mellitus with hyperglycemia: Secondary | ICD-10-CM | POA: Diagnosis not present

## 2017-03-11 LAB — COMPREHENSIVE METABOLIC PANEL
ALBUMIN: 4.3 g/dL (ref 3.5–5.2)
ALK PHOS: 72 U/L (ref 39–117)
ALT: 12 U/L (ref 0–35)
AST: 17 U/L (ref 0–37)
BUN: 18 mg/dL (ref 6–23)
CO2: 28 mEq/L (ref 19–32)
CREATININE: 1.06 mg/dL (ref 0.40–1.20)
Calcium: 10 mg/dL (ref 8.4–10.5)
Chloride: 104 mEq/L (ref 96–112)
GFR: 68.69 mL/min (ref >60.00)
GLUCOSE: 111 mg/dL — AB (ref 70–99)
POTASSIUM: 4.2 meq/L (ref 3.5–5.1)
SODIUM: 139 meq/L (ref 135–145)
TOTAL PROTEIN: 8.1 g/dL (ref 6.0–8.3)
Total Bilirubin: 0.5 mg/dL (ref 0.2–1.2)

## 2017-03-11 LAB — LIPID PANEL
CHOLESTEROL: 170 mg/dL (ref 0–200)
HDL: 45.1 mg/dL (ref >39.00)
LDL CALC: 111 mg/dL — AB (ref 0–99)
NONHDL: 124.51
Total CHOL/HDL Ratio: 4
Triglycerides: 67 mg/dL (ref 0.0–149.0)
VLDL: 13.4 mg/dL (ref 0.0–40.0)

## 2017-03-11 LAB — HEMOGLOBIN A1C: HEMOGLOBIN A1C: 6.8 % — AB (ref 4.6–6.5)

## 2017-03-15 ENCOUNTER — Ambulatory Visit (INDEPENDENT_AMBULATORY_CARE_PROVIDER_SITE_OTHER): Payer: BC Managed Care – PPO | Admitting: Endocrinology

## 2017-03-15 ENCOUNTER — Encounter: Payer: Self-pay | Admitting: Endocrinology

## 2017-03-15 VITALS — BP 150/90 | HR 94 | Ht 65.0 in | Wt 154.0 lb

## 2017-03-15 DIAGNOSIS — Z23 Encounter for immunization: Secondary | ICD-10-CM | POA: Diagnosis not present

## 2017-03-15 DIAGNOSIS — E1165 Type 2 diabetes mellitus with hyperglycemia: Secondary | ICD-10-CM

## 2017-03-15 MED ORDER — ONETOUCH ULTRASOFT LANCETS MISC: 12 refills | Status: DC

## 2017-03-15 MED ORDER — GLUCOSE BLOOD VI STRP: ORAL_STRIP | 12 refills | Status: DC

## 2017-03-15 MED ORDER — ATORVASTATIN CALCIUM 20 MG PO TABS: 20.0000 mg | ORAL_TABLET | Freq: Every day | ORAL | 1 refills | Status: DC

## 2017-03-15 NOTE — Progress Notes (Signed)
Patient ID: Sherry Hart, female   DOB: 08/04/1960, 56 y.o.   MRN: [id number]   Reason for Appointment : Followup of diabetes   History of Present Illness          Problem 1: Type 2 diabetes mellitus, date of diagnosis: 2002       Past history: She had been treated with metformin since diagnosis and previous records are not available for review. She thinks that when she was taking her blood sugar regularly there were usually below 150 in the morning In 2012 because of lack of insurance she did not take her medications or check her sugar for about a year In 6/13 she was evaluated in the emergency room for abdominal pain and was found to have an A1c of 14% with very high blood sugars. Her only symptoms were weight loss. She was given Lantus, unknown dose for about 2 months but she went off this on her own since she did not want to continue this while going to work. Also was presumably given glipizide at that time but no details available She was seen for initial consultation in 1/15 and at that time her glucose was 431 with A1c of 13.1 Initially treated with insulin but this was later replaced with oral agents She started taking Kombiglyze XR on 08/08/13 and had gone up to 2 tablets daily Because of tendency to relatively higher fasting readings she was given low-dose Amaryl in 3/15 in addition  Recent history:   Oral hypoglycemic drugs the patient is taking are:  Glimeperide 0.5 mg acs, Janumet XR 100/1000 daily, Jardiance 25 mg at dinner  She has been on Trulicity 0.15 mg weekly since 12/05/16  Prior to this her A1c was 8.3, A1c is now 6.8   Current blood sugars at home and management:  FASTING blood sugar range 100-1 28, suppertime 72  Has only high readings in the last month  She is trying to keep her portions small, also her Amaryl was reduced to half tablet on her last visit  Although she is generally doing fairly well on her diet recently she has been eating more  cold cuts and canned food  Also has not found the time to exercise as usual She does find that she is smaller portions with this  Her glucose readings prior to starting Trulicity were about 615 the morning and as high as 179 postprandial  Her weight is about the same   Side effects from medications have been:  GI side effects from 2000 mg metformin ER  Glucose monitoring:  done infrequently, mostly in the morning        Glucometer: Freestyle    Blood Glucose readings by download as above    Glycemic control:   Lab Results  Component Value Date   HGBA1C 6.8 (H) 03/11/2017   HGBA1C 8.3 (H) 12/01/2016   HGBA1C 7.6 (H) 09/02/2016   Lab Results  Component Value Date   MICROALBUR 1.1 09/02/2016   LDLCALC 111 (H) 03/11/2017   CREATININE 1.06 03/11/2017   Self-care: The diet that the patient has been following is:  usually low fat, low carbs, Variable.   Breakfast may be  occasionally cereal or peanut butter crackers Usually has fruit for snacks   Avoiding drinks with sugar, dinner is at 5-7 pm            Exercise: She is trying to do some walking, not regularly     Dietician visit: Most recent:  01/01/17              Compliance with the medical regimen: fair  Weight history: 125 upto 173, her lowest weight was in 11/2011  Wt Readings from Last 3 Encounters:  03/15/17 154 lb (69.9 kg)  01/12/17 155 lb (70.3 kg)  01/01/17 153 lb (69.4 kg)   Lab on 03/11/2017  Component Date Value Ref Range Status  . Hgb A1c MFr Bld 03/11/2017 6.8* 4.6 - 6.5 % Final   Glycemic Control Guidelines for People with Diabetes:Non Diabetic:  <6%Goal of Therapy: <7%Additional Action Suggested:  >8%   . Sodium 03/11/2017 139  135 - 145 mEq/L Final  . Potassium 03/11/2017 4.2  3.5 - 5.1 mEq/L Final  . Chloride 03/11/2017 104  96 - 112 mEq/L Final  . CO2 03/11/2017 28  19 - 32 mEq/L Final  . Glucose, Bld 03/11/2017 111* 70 - 99 mg/dL Final  . BUN 03/11/2017 18  6 - 23 mg/dL Final  . Creatinine, Ser  03/11/2017 1.06  0.40 - 1.20 mg/dL Final  . Total Bilirubin 03/11/2017 0.5  0.2 - 1.2 mg/dL Final  . Alkaline Phosphatase 03/11/2017 72  39 - 117 U/L Final  . AST 03/11/2017 17  0 - 37 U/L Final  . ALT 03/11/2017 12  0 - 35 U/L Final  . Total Protein 03/11/2017 8.1  6.0 - 8.3 g/dL Final  . Albumin 03/11/2017 4.3  3.5 - 5.2 g/dL Final  . Calcium 03/11/2017 10.0  8.4 - 10.5 mg/dL Final  . GFR 03/11/2017 68.69  >60.00 mL/min Final  . Cholesterol 03/11/2017 170  0 - 200 mg/dL Final   ATP III Classification       Desirable:  < 200 mg/dL               Borderline High:  200 - 239 mg/dL          High:  > = 240 mg/dL  . Triglycerides 03/11/2017 67.0  0.0 - 149.0 mg/dL Final   Normal:  <150 mg/dLBorderline High:  150 - 199 mg/dL  . HDL 03/11/2017 45.10  >39.00 mg/dL Final  . VLDL 03/11/2017 13.4  0.0 - 40.0 mg/dL Final  . LDL Cholesterol 03/11/2017 111* 0 - 99 mg/dL Final  . Total CHOL/HDL Ratio 03/11/2017 4   Final                  Men          Women1/2 Average Risk     3.4          3.3Average Risk          5.0          4.42X Average Risk          9.6          7.13X Average Risk          15.0          11.0                      . NonHDL 03/11/2017 124.51   Final   NOTE:  Non-HDL goal should be 30 mg/dL higher than patient's LDL goal (i.e. LDL goal of < 70 mg/dL, would have non-HDL goal of < 100 mg/dL)   Other active problems: See review of systems     Allergies as of 03/15/2017      Reactions   Penicillins Hives   Back of neck and upper back.  Medication List       Accurate as of 03/15/17  8:25 PM. Always use your most recent med list.          aspirin 81 MG tablet Take 81 mg by mouth daily.   atorvastatin 20 MG tablet Commonly known as:  LIPITOR Take 1 tablet (20 mg total) by mouth daily.   Dulaglutide 0.75 MG/0.5ML Sopn Commonly known as:  TRULICITY INJECT IN THE ABDOMINAL SKIN AS DIRECTED ONCE A WEEK   glimepiride 1 MG tablet Commonly known as:  AMARYL TAKE 1 TABLET  WITH SUPPER EVERY DAY   glucose blood test strip Use as instructed   JANUMET XR [id number] MG Tb24 Generic drug:  SitaGLIPtin-MetFORMIN HCl TAKE 1 TABLET BY MOUTH EVERY DAY   JARDIANCE 25 MG Tabs tablet Generic drug:  empagliflozin TAKE 25 MG BY MOUTH DAILY.   losartan 100 MG tablet Commonly known as:  COZAAR Take 1 tablet (100 mg total) by mouth daily.   ONE TOUCH ULTRA 2 w/Device Kit Use to check blood sugar 3 times per day dx code E11.65   onetouch ultrasoft lancets Use as instructed       Allergies:  Allergies  Allergen Reactions  . Penicillins Hives    Back of neck and upper back.    Past Medical History:  Diagnosis Date  . Abdominal pain   . Anemia   . B12 deficiency   . Biliary colic   . Constipation    occasionally not chronic per pt. -no medicines for this   . Diabetes mellitus 12 yrs ago   . Hyperlipidemia   . Hypertension   . Pernicious anemia   . Unexplained weight loss   . Vomiting   . Weakness     Past Surgical History:  Procedure Laterality Date  . CESAREAN SECTION  11/29/1990, 04/06/1994  . COLONOSCOPY  09/01/2004   all normal - in epic  . KNEE SURGERY  11/2000   right  . MOUTH SURGERY  04/04/1987  . UPPER GASTROINTESTINAL ENDOSCOPY  09-01-04   gastric polyp- m.johnson- in epic  . WISDOM TOOTH EXTRACTION      Family History  Problem Relation Age of Onset  . Cancer Mother        breast  . Breast cancer Mother   . Diabetes Father   . Thyroid disease Sister   . Hypertension Neg Hx   . Colon cancer Neg Hx   . Colon polyps Neg Hx   . Rectal cancer Neg Hx   . Stomach cancer Neg Hx     Social History:  reports that she has never smoked. She has never used smokeless tobacco. She reports that she does not drink alcohol or use drugs.    Review of Systems     HYPERTENSION: blood pressure has been treated with Cozaar 162m   Her blood pressure tends to be higher in the office at times including today  She has not checked readings at  home lately  BP Readings from Last 3 Encounters:  03/15/17 (!) 150/90  01/12/17 136/82  12/04/16 112/84     Lipids:   She has been taking Lipitor that was prescribed in 12/2013  Last LDL 102 And this is now on 11 She may be getting some higher fat foods in the diet occasionally including cold cuts   Lab Results  Component Value Date   CHOL 170 03/11/2017   HDL 45.10 03/11/2017   LDLCALC 111 (H) 03/11/2017   LDLDIRECT 175.6 07/28/2013  TRIG 67.0 03/11/2017   CHOLHDL 4 03/11/2017    She has vitamin B-12 deficiency and is on injectable treatment monthly from PCP   Physical Examination:  BP (!) 150/90   Pulse 94   Ht '5\' 5"'  (1.651 m)   Wt 154 lb (69.9 kg)   SpO2 98%   BMI 25.63 kg/m    Repeat blood pressure 148/88  ASSESSMENT/PLAN:   Diabetes type 2 See history of present illness for  discussion of  current management, blood sugar patterns and problems identified  Her A1c is significantly better at 6.8 with adding Trulicity to her Jardiance and Janumet XR and low-dose Amaryl She is however checking infrequently and not clear if she is having any high readings after meals Also she can do better with exercise and consistent diet as discussed above  HYPERLIPIDEMIA: Her LDL is still not at target and somewhat worse Since her diet is not consistently high saturated fat Will increase her Lipitor to 20 mg  Hypertension: Not clear blood pressure is higher consistently and she needs to check home before we increase her medications She will also check with PCP next month   Patient Instructions  Check blood sugars on waking up  2/7 days  Also check blood sugars about 2 hours after a meal and do this after different meals by rotation  Recommended blood sugar levels on waking up is 90-130 and about 2 hours after meal is 130-160  Please bring your blood sugar monitor to each visit, thank you  Take 49m of Lipitor  Walk daily  Check BP  weekly    Sherry Hart 03/15/2017, 8:25 PM   Note: This office note was prepared with DEstate agent Any transcriptional errors that result from this process are unintentional.

## 2017-03-15 NOTE — Patient Instructions (Addendum)
Check blood sugars on waking up  2/7 days  Also check blood sugars about 2 hours after a meal and do this after different meals by rotation  Recommended blood sugar levels on waking up is 90-130 and about 2 hours after meal is 130-160  Please bring your blood sugar monitor to each visit, thank you  Take 20mg  of Lipitor  Walk daily  Check BP weekly

## 2017-03-31 ENCOUNTER — Other Ambulatory Visit: Payer: Self-pay | Admitting: Endocrinology

## 2017-04-01 ENCOUNTER — Other Ambulatory Visit: Payer: Self-pay | Admitting: Endocrinology

## 2017-04-01 ENCOUNTER — Ambulatory Visit (INDEPENDENT_AMBULATORY_CARE_PROVIDER_SITE_OTHER): Payer: BC Managed Care – PPO | Admitting: *Deleted

## 2017-04-01 DIAGNOSIS — E538 Deficiency of other specified B group vitamins: Secondary | ICD-10-CM

## 2017-04-09 ENCOUNTER — Other Ambulatory Visit: Payer: Self-pay | Admitting: Endocrinology

## 2017-04-13 ENCOUNTER — Other Ambulatory Visit: Payer: Self-pay | Admitting: Endocrinology

## 2017-04-28 ENCOUNTER — Telehealth: Payer: Self-pay | Admitting: Endocrinology

## 2017-04-28 NOTE — Telephone Encounter (Signed)
Patient saw that her medication Losartan (generic for Covaar) has been recalled. She is calling her Dr. Because the news told her to, before she stops taking the medication. Please call patient at ph# [phone] and leave a detailed message on what she should do.Patient will also call her pharmacy to check on status of recall.

## 2017-04-28 NOTE — Telephone Encounter (Signed)
Noted  

## 2017-04-28 NOTE — Telephone Encounter (Signed)
Patient called her pharmacy-her medication is NOT part of the recall-so disregard previous message

## 2017-05-03 ENCOUNTER — Ambulatory Visit (INDEPENDENT_AMBULATORY_CARE_PROVIDER_SITE_OTHER): Payer: BC Managed Care – PPO

## 2017-05-03 DIAGNOSIS — E538 Deficiency of other specified B group vitamins: Secondary | ICD-10-CM

## 2017-05-03 DIAGNOSIS — D519 Vitamin B12 deficiency anemia, unspecified: Secondary | ICD-10-CM | POA: Diagnosis not present

## 2017-05-03 NOTE — Progress Notes (Signed)
Patient was seen in office to receive a b12 injection.Patietn received injection in left arm subcutaneous.Patient tolerated well

## 2017-05-12 ENCOUNTER — Other Ambulatory Visit: Payer: Self-pay | Admitting: Endocrinology

## 2017-05-23 ENCOUNTER — Other Ambulatory Visit: Payer: Self-pay | Admitting: Endocrinology

## 2017-06-02 ENCOUNTER — Ambulatory Visit (INDEPENDENT_AMBULATORY_CARE_PROVIDER_SITE_OTHER): Payer: BC Managed Care – PPO | Admitting: Family Medicine

## 2017-06-02 DIAGNOSIS — E538 Deficiency of other specified B group vitamins: Secondary | ICD-10-CM

## 2017-06-12 ENCOUNTER — Other Ambulatory Visit: Payer: Self-pay | Admitting: Endocrinology

## 2017-06-14 ENCOUNTER — Other Ambulatory Visit: Payer: Self-pay

## 2017-06-14 ENCOUNTER — Other Ambulatory Visit (INDEPENDENT_AMBULATORY_CARE_PROVIDER_SITE_OTHER): Payer: BC Managed Care – PPO

## 2017-06-14 ENCOUNTER — Telehealth: Payer: Self-pay | Admitting: Endocrinology

## 2017-06-14 DIAGNOSIS — E1165 Type 2 diabetes mellitus with hyperglycemia: Secondary | ICD-10-CM | POA: Diagnosis not present

## 2017-06-14 LAB — LIPID PANEL
CHOLESTEROL: 152 mg/dL (ref 0–200)
HDL: 42.1 mg/dL (ref >39.00)
LDL CALC: 95 mg/dL (ref 0–99)
NonHDL: 110.11
TRIGLYCERIDES: 74 mg/dL (ref 0.0–149.0)
Total CHOL/HDL Ratio: 4
VLDL: 14.8 mg/dL (ref 0.0–40.0)

## 2017-06-14 LAB — COMPREHENSIVE METABOLIC PANEL
ALT: 12 U/L (ref 0–35)
AST: 18 U/L (ref 0–37)
Albumin: 4.3 g/dL (ref 3.5–5.2)
Alkaline Phosphatase: 67 U/L (ref 39–117)
BILIRUBIN TOTAL: 0.5 mg/dL (ref 0.2–1.2)
BUN: 15 mg/dL (ref 6–23)
CALCIUM: 9.7 mg/dL (ref 8.4–10.5)
CO2: 28 meq/L (ref 19–32)
Chloride: 104 mEq/L (ref 96–112)
Creatinine, Ser: 1.04 mg/dL (ref 0.40–1.20)
GFR: 70.15 mL/min (ref >60.00)
Glucose, Bld: 106 mg/dL — ABNORMAL HIGH (ref 70–99)
Potassium: 4.5 mEq/L (ref 3.5–5.1)
SODIUM: 139 meq/L (ref 135–145)
Total Protein: 8.2 g/dL (ref 6.0–8.3)

## 2017-06-14 LAB — HEMOGLOBIN A1C: Hgb A1c MFr Bld: 6.6 % — ABNORMAL HIGH (ref 4.6–6.5)

## 2017-06-14 LAB — MICROALBUMIN / CREATININE URINE RATIO
CREATININE, U: 94.9 mg/dL
MICROALB/CREAT RATIO: 1.1 mg/g (ref 0.0–30.0)
Microalb, Ur: 1 mg/dL (ref 0.0–1.9)

## 2017-06-14 MED ORDER — SITAGLIP PHOS-METFORMIN HCL ER 100-1000 MG PO TB24: 1.0000 | ORAL_TABLET | Freq: Every day | ORAL | 3 refills | Status: DC

## 2017-06-14 MED ORDER — DULAGLUTIDE 0.75 MG/0.5ML ~~LOC~~ SOAJ: SUBCUTANEOUS | 4 refills | Status: DC

## 2017-06-14 NOTE — Telephone Encounter (Signed)
Patient needs Rx for Janumet XR 100-1,000 MG [facility] [address] May need Trulicity also

## 2017-06-14 NOTE — Telephone Encounter (Signed)
These have been refilled [facility] [address].

## 2017-06-16 ENCOUNTER — Ambulatory Visit: Payer: BC Managed Care – PPO | Admitting: Endocrinology

## 2017-06-16 ENCOUNTER — Encounter: Payer: Self-pay | Admitting: Endocrinology

## 2017-06-16 VITALS — BP 140/84 | HR 100 | Ht 65.0 in | Wt 154.0 lb

## 2017-06-16 DIAGNOSIS — E1165 Type 2 diabetes mellitus with hyperglycemia: Secondary | ICD-10-CM | POA: Diagnosis not present

## 2017-06-16 NOTE — Progress Notes (Signed)
Patient ID: Sherry Hart, female   DOB: [birth year], 57 y.o.   MRN: [id number]   Reason for Appointment : Followup of diabetes   History of Present Illness          Problem 1: Type 2 diabetes mellitus, date of diagnosis: 2002       Past history: She had been treated with metformin since diagnosis and previous records are not available for review. She thinks that when she was taking her blood sugar regularly there were usually below 150 in the morning In 2012 because of lack of insurance she did not take her medications or check her sugar for about a year In 6/13 she was evaluated in the emergency room for abdominal pain and was found to have an A1c of 14% with very high blood sugars. Her only symptoms were weight loss. She was given Lantus, unknown dose for about 2 months but she went off this on her own since she did not want to continue this while going to work. Also was presumably given glipizide at that time but no details available She was seen for initial consultation in 1/15 and at that time her glucose was 431 with A1c of 13.1 Initially treated with insulin but this was later replaced with oral agents She started taking Kombiglyze XR on 08/08/13 and had gone up to 2 tablets daily Because of tendency to relatively higher fasting readings she was given low-dose Amaryl in 3/15 in addition  Recent history:   Oral hypoglycemic drugs: Trulicity 5.40 mg weekly, Glimeperide 0.5 mg acs, Janumet XR 100/1000 daily, Jardiance 25 mg at dinner  She has been on Trulicity 0.86 mg weekly since 12/05/16 when her A1c was 8.3  A1c is now improved further at 6.6, her blood sugars at home are relatively lower   Current blood sugars at home and management:  FASTING blood sugar has been checked only about twice with readings of 120 and 116, 106 in the lab  Also she has only about 3 readings in the evenings ranging from 80-1 22 before or after supper  She thinks that she may feel a little  week when her blood sugar is around 80 which is not usual  Has no past postprandial hyperglycemia but does not check these enough  Overall she is trying to be more active and exercise somewhat more regularly since her last visit and has generally been able to watch her diet  Weight has not changed   Side effects from medications have been:  GI side effects from 2000 mg metformin ER  Glucose monitoring:  done infrequently, mostly in the morning        Glucometer: Freestyle     Blood Glucose readings by review of monitor as above    Glycemic control:   Lab Results  Component Value Date   HGBA1C 6.6 (H) 06/14/2017   HGBA1C 6.8 (H) 03/11/2017   HGBA1C 8.3 (H) 12/01/2016   Lab Results  Component Value Date   MICROALBUR 1.0 06/14/2017   [place] 95 06/14/2017   CREATININE 1.04 06/14/2017   Self-care: The diet that the patient has been following is:  usually low fat, low carbs, Variable.   Breakfast may be  occasionally cereal or peanut butter crackers Usually has fruit for snacks   Avoiding drinks with sugar, dinner is at 5-7 pm            Exercise: She is trying to do some more biking/ walking  Dietician visit: Most recent:  01/01/17              Compliance with the medical regimen: fair  Weight history: 125 upto 173, her lowest weight was in 11/2011  Wt Readings from Last 3 Encounters:  06/16/17 154 lb (69.9 kg)  03/15/17 154 lb (69.9 kg)  01/12/17 155 lb (70.3 kg)   Lab on 06/14/2017  Component Date Value Ref Range Status  . Cholesterol 06/14/2017 152  0 - 200 mg/dL Final   ATP III Classification       Desirable:  < 200 mg/dL               Borderline High:  200 - 239 mg/dL          High:  > = 240 mg/dL  . Triglycerides 06/14/2017 74.0  0.0 - 149.0 mg/dL Final   Normal:  <150 mg/dLBorderline High:  150 - 199 mg/dL  . HDL 06/14/2017 42.10  >39.00 mg/dL Final  . VLDL 06/14/2017 14.8  0.0 - 40.0 mg/dL Final  . LDL Cholesterol 06/14/2017 95  0 - 99 mg/dL Final  . Total  CHOL/HDL Ratio 06/14/2017 4   Final                  Men          Women1/2 Average Risk     3.4          3.3Average Risk          5.0          4.42X Average Risk          9.6          7.13X Average Risk          15.0          11.0                      . NonHDL 06/14/2017 110.11   Final   NOTE:  Non-HDL goal should be 30 mg/dL higher than patient's LDL goal (i.e. LDL goal of < 70 mg/dL, would have non-HDL goal of < 100 mg/dL)  . Microalb, Ur 06/14/2017 1.0  0.0 - 1.9 mg/dL Final  . Creatinine,U 06/14/2017 94.9  mg/dL Final  . Microalb Creat Ratio 06/14/2017 1.1  0.0 - 30.0 mg/g Final  . Sodium 06/14/2017 139  135 - 145 mEq/L Final  . Potassium 06/14/2017 4.5  3.5 - 5.1 mEq/L Final  . Chloride 06/14/2017 104  96 - 112 mEq/L Final  . CO2 06/14/2017 28  19 - 32 mEq/L Final  . Glucose, Bld 06/14/2017 106* 70 - 99 mg/dL Final  . BUN 06/14/2017 15  6 - 23 mg/dL Final  . Creatinine, Ser 06/14/2017 1.04  0.40 - 1.20 mg/dL Final  . Total Bilirubin 06/14/2017 0.5  0.2 - 1.2 mg/dL Final  . Alkaline Phosphatase 06/14/2017 67  39 - 117 U/L Final  . AST 06/14/2017 18  0 - 37 U/L Final  . ALT 06/14/2017 12  0 - 35 U/L Final  . Total Protein 06/14/2017 8.2  6.0 - 8.3 g/dL Final  . Albumin 06/14/2017 4.3  3.5 - 5.2 g/dL Final  . Calcium 06/14/2017 9.7  8.4 - 10.5 mg/dL Final  . GFR 06/14/2017 70.15  >60.00 mL/min Final  . Hgb A1c MFr Bld 06/14/2017 6.6* 4.6 - 6.5 % Final   Glycemic Control Guidelines for People with Diabetes:Non Diabetic:  <6%Goal of Therapy: <7%Additional Action  Suggested:  >8%    Other active problems: See review of systems     Allergies as of 06/16/2017      Reactions   Penicillins Hives   Back of neck and upper back.      Medication List        Accurate as of 06/16/17  4:20 PM. Always use your most recent med list.          aspirin 81 MG tablet Take 81 mg by mouth daily.   atorvastatin 20 MG tablet Commonly known as:  LIPITOR Take 1 tablet (20 mg total) by mouth  daily.   Dulaglutide 0.75 MG/0.5ML Sopn Commonly known as:  TRULICITY INJECT IN THE ABDOMINAL SKIN AS DIRECTED ONCE A WEEK   glimepiride 1 MG tablet Commonly known as:  AMARYL TAKE 1 TABLET WITH SUPPER EVERY DAY   glucose blood test strip Use as instructed   JARDIANCE 25 MG Tabs tablet Generic drug:  empagliflozin TAKE 1 TABLET BY MOUTH EVERY DAY   losartan 100 MG tablet Commonly known as:  COZAAR TAKE 1 TABLET BY MOUTH DAILY   ONE TOUCH ULTRA 2 w/Device Kit Use to check blood sugar 3 times per day dx code E11.65   onetouch ultrasoft lancets Use as instructed   SitaGLIPtin-MetFORMIN HCl [phone] MG Tb24 Commonly known as:  JANUMET XR Take 1 tablet by mouth daily.       Allergies:  Allergies  Allergen Reactions  . Penicillins Hives    Back of neck and upper back.    Past Medical History:  Diagnosis Date  . Abdominal pain   . Anemia   . B12 deficiency   . Biliary colic   . Constipation    occasionally not chronic per pt. -no medicines for this   . Diabetes mellitus 12 yrs ago   . Hyperlipidemia   . Hypertension   . Pernicious anemia   . Unexplained weight loss   . Vomiting   . Weakness     Past Surgical History:  Procedure Laterality Date  . CESAREAN SECTION  11/29/1990, 04/06/1994  . COLONOSCOPY  09/01/2004   all normal - in epic  . KNEE SURGERY  11/2000   right  . MOUTH SURGERY  04/04/1987  . UPPER GASTROINTESTINAL ENDOSCOPY  09-01-04   gastric polyp- m.johnson- in epic  . WISDOM TOOTH EXTRACTION      Family History  Problem Relation Age of Onset  . Cancer Mother        breast  . Breast cancer Mother   . Diabetes Father   . Thyroid disease Sister   . Hypertension Neg Hx   . Colon cancer Neg Hx   . Colon polyps Neg Hx   . Rectal cancer Neg Hx   . Stomach cancer Neg Hx     Social History:  reports that  has never smoked. she has never used smokeless tobacco. She reports that she does not drink alcohol or use drugs.    Review of Systems      HYPERTENSION: blood pressure has been treated with Cozaar 161m   Her blood pressure tends to be higher in the office at times including the first reading today   She has not checked readings at home   BP Readings from Last 3 Encounters:  06/16/17 140/84  03/15/17 (!) 150/90  01/12/17 136/82     Lipids:   She has been taking Lipitor from PCP This was however increased on her last visit here in October  because of LDL of 111 LDL is now better     Lab Results  Component Value Date   CHOL 152 06/14/2017   HDL 42.10 06/14/2017   LDLCALC 95 06/14/2017   LDLDIRECT 175.6 07/28/2013   TRIG 74.0 06/14/2017   CHOLHDL 4 06/14/2017    She has vitamin B-12 deficiency and is on injectable treatment monthly from PCP   Physical Examination:  BP 140/84   Pulse 100   Ht '5\' 5"'  (1.651 m)   Wt 154 lb (69.9 kg)   SpO2 98%   BMI 25.63 kg/m    Repeat blood pressure 138/76  ASSESSMENT/PLAN:   Diabetes type 2 See history of present illness for  discussion of  current management, blood sugar patterns and problems identified  Her A1c is significantly better at 6.6 and she is benefiting from 4.73 mg Trulicity Also on Jardiance and Janumet XR and low-dose Amaryl She is however checking infrequently again Blood sugars are probably lower than expected from her A1c and discussed that her A1c is at a good level for her now  She is also doing better with her diet and exercise regimen and is quite motivated  HYPERLIPIDEMIA: Her LDL is at target with increasing her Lipitor Since her diet is is also fairly good she will continue her 20 mg dose  Hypertension: Well controlled  She will also check blood pressure with PCP office next month  Follow-up in 4 months  Patient Instructions  Check blood sugars on waking up 2-3/7   Also check blood sugars about 2 hours after a meal and do this after different meals by rotation  Recommended blood sugar levels on waking up is 90-130 and about  2 hours after meal is 130-160  Please bring your blood sugar monitor to each visit, thank you     Elayne Snare 06/16/2017, 4:20 PM   Note: This office note was prepared with Dragon voice recognition system technology. Any transcriptional errors that result from this process are unintentional.

## 2017-06-16 NOTE — Patient Instructions (Addendum)
Check blood sugars on waking up  2-3/7  Also check blood sugars about 2 hours after a meal and do this after different meals by rotation  Recommended blood sugar levels on waking up is 90-130 and about 2 hours after meal is 130-160  Please bring your blood sugar monitor to each visit, thank you   

## 2017-07-05 ENCOUNTER — Ambulatory Visit (INDEPENDENT_AMBULATORY_CARE_PROVIDER_SITE_OTHER): Payer: BC Managed Care – PPO | Admitting: Family Medicine

## 2017-07-05 DIAGNOSIS — E538 Deficiency of other specified B group vitamins: Secondary | ICD-10-CM | POA: Diagnosis not present

## 2017-07-19 ENCOUNTER — Other Ambulatory Visit: Payer: Self-pay | Admitting: Family Medicine

## 2017-07-19 DIAGNOSIS — Z1231 Encounter for screening mammogram for malignant neoplasm of breast: Secondary | ICD-10-CM

## 2017-08-05 ENCOUNTER — Other Ambulatory Visit: Payer: Self-pay

## 2017-08-05 ENCOUNTER — Ambulatory Visit (INDEPENDENT_AMBULATORY_CARE_PROVIDER_SITE_OTHER): Payer: BC Managed Care – PPO | Admitting: *Deleted

## 2017-08-05 DIAGNOSIS — E538 Deficiency of other specified B group vitamins: Secondary | ICD-10-CM

## 2017-08-25 ENCOUNTER — Other Ambulatory Visit: Payer: Self-pay | Admitting: Endocrinology

## 2017-08-30 ENCOUNTER — Ambulatory Visit
Admission: RE | Admit: 2017-08-30 | Discharge: 2017-08-30 | Disposition: A | Discharge status: Home / Self Care | Payer: BC Managed Care – PPO | Source: Ambulatory Visit | Attending: Family Medicine | Admitting: Family Medicine

## 2017-08-30 DIAGNOSIS — Z1231 Encounter for screening mammogram for malignant neoplasm of breast: Secondary | ICD-10-CM

## 2017-09-02 ENCOUNTER — Ambulatory Visit (INDEPENDENT_AMBULATORY_CARE_PROVIDER_SITE_OTHER): Payer: BC Managed Care – PPO | Admitting: *Deleted

## 2017-09-02 DIAGNOSIS — E538 Deficiency of other specified B group vitamins: Secondary | ICD-10-CM

## 2017-09-02 NOTE — Progress Notes (Signed)
Patient seen in office for Vitamin B 12 injection.   Tolerated IM administration well.  

## 2017-09-10 ENCOUNTER — Other Ambulatory Visit: Payer: Self-pay | Admitting: Endocrinology

## 2017-09-26 ENCOUNTER — Other Ambulatory Visit: Payer: Self-pay | Admitting: Endocrinology

## 2017-10-11 ENCOUNTER — Other Ambulatory Visit (INDEPENDENT_AMBULATORY_CARE_PROVIDER_SITE_OTHER): Payer: BC Managed Care – PPO

## 2017-10-11 DIAGNOSIS — E1165 Type 2 diabetes mellitus with hyperglycemia: Secondary | ICD-10-CM

## 2017-10-11 LAB — COMPREHENSIVE METABOLIC PANEL
ALK PHOS: 71 U/L (ref 39–117)
ALT: 9 U/L (ref 0–35)
AST: 15 U/L (ref 0–37)
Albumin: 4.3 g/dL (ref 3.5–5.2)
BUN: 19 mg/dL (ref 6–23)
CO2: 30 mEq/L (ref 19–32)
Calcium: 10.2 mg/dL (ref 8.4–10.5)
Chloride: 102 mEq/L (ref 96–112)
Creatinine, Ser: 1.09 mg/dL (ref 0.40–1.20)
GFR: 66.37 mL/min (ref >60.00)
GLUCOSE: 115 mg/dL — AB (ref 70–99)
POTASSIUM: 4.5 meq/L (ref 3.5–5.1)
SODIUM: 140 meq/L (ref 135–145)
TOTAL PROTEIN: 8.3 g/dL (ref 6.0–8.3)
Total Bilirubin: 0.5 mg/dL (ref 0.2–1.2)

## 2017-10-11 LAB — HEMOGLOBIN A1C: Hgb A1c MFr Bld: 6.4 % (ref 4.6–6.5)

## 2017-10-14 ENCOUNTER — Encounter: Payer: Self-pay | Admitting: Endocrinology

## 2017-10-14 ENCOUNTER — Ambulatory Visit: Payer: BC Managed Care – PPO | Admitting: Endocrinology

## 2017-10-14 VITALS — BP 136/84 | HR 89 | Ht 65.0 in | Wt 151.0 lb

## 2017-10-14 DIAGNOSIS — E119 Type 2 diabetes mellitus without complications: Secondary | ICD-10-CM | POA: Diagnosis not present

## 2017-10-14 NOTE — Progress Notes (Signed)
Patient ID: Sherry Hart, female   DOB: 30-Apr-1961, 57 y.o.   MRN: [id number]   Reason for Appointment : Followup of diabetes   History of Present Illness          Problem 1: Type 2 diabetes mellitus, date of diagnosis: 2002       Past history: She had been treated with metformin since diagnosis and previous records are not available for review. She thinks that when she was taking her blood sugar regularly there were usually below 150 in the morning In 2012 because of lack of insurance she did not take her medications or check her sugar for about a year In 6/13 she was evaluated in the emergency room for abdominal pain and was found to have an A1c of 14% with very high blood sugars. Her only symptoms were weight loss. She was given Lantus, unknown dose for about 2 months but she went off this on her own since she did not want to continue this while going to work. Also was presumably given glipizide at that time but no details available She was seen for initial consultation in 1/15 and at that time her glucose was 431 with A1c of 13.1 Initially treated with insulin but this was later replaced with oral agents She started taking Kombiglyze XR on 08/08/13 and had gone up to 2 tablets daily Because of tendency to relatively higher fasting readings she was given low-dose Amaryl in 3/15 in addition  Recent history:   Oral hypoglycemic drugs: Trulicity 7.34 mg weekly, Glimeperide 0.5 mg acs, Janumet XR 100/1000 daily, Jardiance 25 mg at dinner  She has been on Trulicity 2.87 mg weekly since 12/05/16 when her A1c was 8.3  A1c is now improved further at 6.6 and gradually improving    Current blood sugars at home and management:  Blood sugars were reviewed from her monitor and she does not have very many readings  Most of her readings are either morning or before supper and in the last month has only one unusually high reading of 199 possibly after late lunch  She did have also  have a reading of 59 before supper when she likely did not have much at lunchtime  AVERAGE blood sugar at home 99  She is not doing much formal exercise because of her busy schedule  She thinks she is usually watching her diet well with the help of her daughter and avoiding high carbohydrate and high fat foods  She is still taking Janumet along with her Trulicity, previously had some side effects from metformin ER  Weight has come down   Side effects from medications have been:  GI side effects from 2000 mg metformin ER  Glucose monitoring:  done infrequently, mostly in the morning        Glucometer:       Blood Glucose readings by review of monitor as above    Glycemic control:   Lab Results  Component Value Date   HGBA1C 6.4 10/11/2017   HGBA1C 6.6 (H) 06/14/2017   HGBA1C 6.8 (H) 03/11/2017   Lab Results  Component Value Date   MICROALBUR 1.0 06/14/2017   [place] 95 06/14/2017   CREATININE 1.09 10/11/2017   Self-care: The diet that the patient has been following is:  usually low fat, low carbs, Variable.    Usually has fruit for snacks   Avoiding drinks with sugar, dinner is at 5-7 pm  Exercise: She is trying to do some walking but mostly at work    Dietician visit: Most recent:  01/01/17              Compliance with the medical regimen: fair  Weight history: 125 upto 173, her lowest weight was in 11/2011  Wt Readings from Last 3 Encounters:  10/14/17 151 lb (68.5 kg)  06/16/17 154 lb (69.9 kg)  03/15/17 154 lb (69.9 kg)   Lab on 10/11/2017  Component Date Value Ref Range Status  . Sodium 10/11/2017 140  135 - 145 mEq/L Final  . Potassium 10/11/2017 4.5  3.5 - 5.1 mEq/L Final  . Chloride 10/11/2017 102  96 - 112 mEq/L Final  . CO2 10/11/2017 30  19 - 32 mEq/L Final  . Glucose, Bld 10/11/2017 115* 70 - 99 mg/dL Final  . BUN 10/11/2017 19  6 - 23 mg/dL Final  . Creatinine, Ser 10/11/2017 1.09  0.40 - 1.20 mg/dL Final  . Total Bilirubin 10/11/2017  0.5  0.2 - 1.2 mg/dL Final  . Alkaline Phosphatase 10/11/2017 71  39 - 117 U/L Final  . AST 10/11/2017 15  0 - 37 U/L Final  . ALT 10/11/2017 9  0 - 35 U/L Final  . Total Protein 10/11/2017 8.3  6.0 - 8.3 g/dL Final  . Albumin 10/11/2017 4.3  3.5 - 5.2 g/dL Final  . Calcium 10/11/2017 10.2  8.4 - 10.5 mg/dL Final  . GFR 10/11/2017 66.37  >60.00 mL/min Final  . Hgb A1c MFr Bld 10/11/2017 6.4  4.6 - 6.5 % Final   Glycemic Control Guidelines for People with Diabetes:Non Diabetic:  <6%Goal of Therapy: <7%Additional Action Suggested:  >8%    Other active problems: See review of systems     Allergies as of 10/14/2017      Reactions   Penicillins Hives   Back of neck and upper back.      Medication List        Accurate as of 10/14/17 11:59 PM. Always use your most recent med list.          aspirin 81 MG tablet Take 81 mg by mouth daily.   atorvastatin 20 MG tablet Commonly known as:  LIPITOR TAKE 1 TABLET BY MOUTH EVERY DAY   Dulaglutide 0.75 MG/0.5ML Sopn Commonly known as:  TRULICITY INJECT IN THE ABDOMINAL SKIN AS DIRECTED ONCE A WEEK   glimepiride 1 MG tablet Commonly known as:  AMARYL TAKE 1 TABLET WITH SUPPER EVERY DAY   glucose blood test strip Use as instructed   JARDIANCE 25 MG Tabs tablet Generic drug:  empagliflozin TAKE 1 TABLET BY MOUTH EVERY DAY   losartan 100 MG tablet Commonly known as:  COZAAR TAKE 1 TABLET BY MOUTH DAILY   ONE TOUCH ULTRA 2 w/Device Kit Use to check blood sugar 3 times per day dx code E11.65   onetouch ultrasoft lancets Use as instructed   SitaGLIPtin-MetFORMIN HCl [phone] MG Tb24 Commonly known as:  JANUMET XR Take 1 tablet by mouth daily.       Allergies:  Allergies  Allergen Reactions  . Penicillins Hives    Back of neck and upper back.    Past Medical History:  Diagnosis Date  . Abdominal pain   . Anemia   . B12 deficiency   . Biliary colic   . Constipation    occasionally not chronic per pt. -no medicines  for this   . Diabetes mellitus 12 yrs ago   .  Hyperlipidemia   . Hypertension   . Pernicious anemia   . Unexplained weight loss   . Vomiting   . Weakness     Past Surgical History:  Procedure Laterality Date  . CESAREAN SECTION  11/29/1990, 04/06/1994  . COLONOSCOPY  09/01/2004   all normal - in epic  . KNEE SURGERY  11/2000   right  . MOUTH SURGERY  04/04/1987  . UPPER GASTROINTESTINAL ENDOSCOPY  09-01-04   gastric polyp- m.johnson- in epic  . WISDOM TOOTH EXTRACTION      Family History  Problem Relation Age of Onset  . Cancer Mother        breast  . Breast cancer Mother 68  . Diabetes Father   . Thyroid disease Sister   . Hypertension Neg Hx   . Colon cancer Neg Hx   . Colon polyps Neg Hx   . Rectal cancer Neg Hx   . Stomach cancer Neg Hx     Social History:  reports that she has never smoked. She has never used smokeless tobacco. She reports that she does not drink alcohol or use drugs.    Review of Systems     HYPERTENSION: blood pressure has been treated with Cozaar 175m   Her blood pressure tends to be higher in the office at times including the first reading today   She has not checked readings at home despite reminders  BP Readings from Last 3 Encounters:  10/14/17 136/84  06/16/17 140/84  03/15/17 (!) 150/90     Lipids:   She has been taking Lipitor from PCP LDL is now controlled with the 20 mg dosage     Lab Results  Component Value Date   CHOL 152 06/14/2017   HDL 42.10 06/14/2017   LDLCALC 95 06/14/2017   LDLDIRECT 175.6 07/28/2013   TRIG 74.0 06/14/2017   CHOLHDL 4 06/14/2017    She has vitamin B-12 deficiency and is on injectable treatment monthly from PCP   Physical Examination:  BP 136/84   Pulse 89   Ht _0  (1.651 m)   Wt 151 lb (68.5 kg)   SpO2 96%   BMI 25.13 kg/m    Diabetic Foot Exam - Simple   Simple Foot Form Diabetic Foot exam was performed with the following findings:  Yes   Visual Inspection No  deformities, no ulcerations, no other skin breakdown bilaterally:  Yes Sensation Testing Intact to touch and monofilament testing bilaterally:  Yes Pulse Check Posterior Tibialis and Dorsalis pulse intact bilaterally:  Yes Comments      ASSESSMENT/PLAN:   Diabetes type 2, nonobese  See history of present illness for  discussion of  current management, blood sugar patterns and problems identified  Her A1c is progressively improving and now excellent six 6.4 She is doing better since she started Trulicity Although she is still on low-dose Amaryl she is only having occasional low sugars before suppertime  Most likely she can try and stop the Amaryl and see if her control is still adequate otherwise including overnight Also recommended that she try to walk more regularly for exercise Currently she is taking Janumet and although she can probably switch to metformin ER she may have possible side effects and the Janumet is still quite affordable She will check blood sugars rotating fasting and 2 hours after meals  No evidence of neuropathy  Hypertension: Well controlled, tends to have whitecoat syndrome at times  Encouraged her to check blood pressure at home  Follow-up in  4 months  Patient Instructions  LEAVE OFF GLIMEPERIDE.  Check blood sugars on waking up    Also check blood sugars about 2 hours after a meal and do this after different meals by rotation  Recommended blood sugar levels on waking up is 90-120 and about 2 hours after meal is 130-160  Please bring your blood sugar monitor to each visit, thank you     Elayne Snare 10/15/2017, 2:10 PM   Note: This office note was prepared with Dragon voice recognition system technology. Any transcriptional errors that result from this process are unintentional.

## 2017-10-14 NOTE — Patient Instructions (Addendum)
LEAVE OFF GLIMEPERIDE.  Check blood sugars on waking up    Also check blood sugars about 2 hours after a meal and do this after different meals by rotation  Recommended blood sugar levels on waking up is 90-120 and about 2 hours after meal is 130-160  Please bring your blood sugar monitor to each visit, thank you

## 2017-10-15 ENCOUNTER — Ambulatory Visit (INDEPENDENT_AMBULATORY_CARE_PROVIDER_SITE_OTHER): Payer: BC Managed Care – PPO

## 2017-10-15 ENCOUNTER — Ambulatory Visit: Payer: BC Managed Care – PPO | Admitting: Endocrinology

## 2017-10-15 DIAGNOSIS — E538 Deficiency of other specified B group vitamins: Secondary | ICD-10-CM | POA: Diagnosis not present

## 2017-10-15 NOTE — Progress Notes (Signed)
Patient was in office to receive a b12 injection. Patient received injection in her right arm subcutaneous. Patient tolerate well

## 2017-10-20 LAB — HM DIABETES EYE EXAM

## 2017-10-21 ENCOUNTER — Encounter: Payer: Self-pay | Admitting: Endocrinology

## 2017-10-26 ENCOUNTER — Encounter: Payer: Self-pay | Admitting: *Deleted

## 2017-10-29 ENCOUNTER — Other Ambulatory Visit: Payer: Self-pay | Admitting: Endocrinology

## 2017-11-15 ENCOUNTER — Ambulatory Visit (INDEPENDENT_AMBULATORY_CARE_PROVIDER_SITE_OTHER): Payer: BC Managed Care – PPO

## 2017-11-15 DIAGNOSIS — E538 Deficiency of other specified B group vitamins: Secondary | ICD-10-CM | POA: Diagnosis not present

## 2017-11-15 NOTE — Progress Notes (Signed)
Patient was in office for b12 injection.Patietn received the injection in her left arm subcutaneous.Patient tolerated well.

## 2017-12-20 ENCOUNTER — Ambulatory Visit (INDEPENDENT_AMBULATORY_CARE_PROVIDER_SITE_OTHER): Payer: BC Managed Care – PPO | Admitting: *Deleted

## 2017-12-20 DIAGNOSIS — E538 Deficiency of other specified B group vitamins: Secondary | ICD-10-CM | POA: Diagnosis not present

## 2017-12-20 NOTE — Progress Notes (Signed)
Patient seen in office for Vitamin B 12 injection.   Tolerated IM administration well in Right Deltoid.

## 2018-01-20 ENCOUNTER — Ambulatory Visit (INDEPENDENT_AMBULATORY_CARE_PROVIDER_SITE_OTHER): Payer: BC Managed Care – PPO | Admitting: Family Medicine

## 2018-01-20 ENCOUNTER — Other Ambulatory Visit: Payer: BC Managed Care – PPO

## 2018-01-20 DIAGNOSIS — E538 Deficiency of other specified B group vitamins: Secondary | ICD-10-CM | POA: Diagnosis not present

## 2018-01-20 DIAGNOSIS — E119 Type 2 diabetes mellitus without complications: Secondary | ICD-10-CM

## 2018-01-20 DIAGNOSIS — Z0000 Encounter for general adult medical examination without abnormal findings: Secondary | ICD-10-CM

## 2018-01-20 DIAGNOSIS — I10 Essential (primary) hypertension: Secondary | ICD-10-CM

## 2018-01-20 DIAGNOSIS — Z79899 Other long term (current) drug therapy: Secondary | ICD-10-CM

## 2018-01-21 LAB — COMPREHENSIVE METABOLIC PANEL
AG Ratio: 1.2 (calc) (ref 1.0–2.5)
ALT: 9 U/L (ref 6–29)
AST: 15 U/L (ref 10–35)
Albumin: 4.2 g/dL (ref 3.6–5.1)
Alkaline phosphatase (APISO): 76 U/L (ref 33–130)
BUN / CREAT RATIO: 16 (calc) (ref 6–22)
BUN: 17 mg/dL (ref 7–25)
CO2: 27 mmol/L (ref 20–32)
CREATININE: 1.08 mg/dL — AB (ref 0.50–1.05)
Calcium: 9.5 mg/dL (ref 8.6–10.4)
Chloride: 105 mmol/L (ref 98–110)
GLUCOSE: 99 mg/dL (ref 65–99)
Globulin: 3.6 g/dL (calc) (ref 1.9–3.7)
Potassium: 4.7 mmol/L (ref 3.5–5.3)
Sodium: 142 mmol/L (ref 135–146)
Total Bilirubin: 0.6 mg/dL (ref 0.2–1.2)
Total Protein: 7.8 g/dL (ref 6.1–8.1)

## 2018-01-21 LAB — CBC WITH DIFFERENTIAL/PLATELET
BASOS ABS: 51 {cells}/uL (ref 0–200)
BASOS PCT: 0.9 %
EOS ABS: 68 {cells}/uL (ref 15–500)
EOS PCT: 1.2 %
HCT: 34.5 % — ABNORMAL LOW (ref 35.0–45.0)
Hemoglobin: 10.9 g/dL — ABNORMAL LOW (ref 11.7–15.5)
Lymphs Abs: 1647 cells/uL (ref 850–3900)
MCH: 26 pg — AB (ref 27.0–33.0)
MCHC: 31.6 g/dL — AB (ref 32.0–36.0)
MCV: 82.1 fL (ref 80.0–100.0)
MPV: 10.8 fL (ref 7.5–12.5)
Monocytes Relative: 7.4 %
Neutro Abs: 3511 cells/uL (ref 1500–7800)
Neutrophils Relative %: 61.6 %
Platelets: 307 10*3/uL (ref 140–400)
RBC: 4.2 10*6/uL (ref 3.80–5.10)
RDW: 14.4 % (ref 11.0–15.0)
TOTAL LYMPHOCYTE: 28.9 %
WBC mixed population: 422 cells/uL (ref 200–950)
WBC: 5.7 10*3/uL (ref 3.8–10.8)

## 2018-01-21 LAB — LIPID PANEL
CHOL/HDL RATIO: 3.5 (calc) (ref <5.0)
Cholesterol: 148 mg/dL (ref <200)
HDL: 42 mg/dL — AB (ref >50)
LDL Cholesterol (Calc): 91 mg/dL (calc)
NON-HDL CHOLESTEROL (CALC): 106 mg/dL (ref <130)
TRIGLYCERIDES: 66 mg/dL (ref <150)

## 2018-01-21 LAB — TSH: TSH: 2.02 mIU/L (ref 0.40–4.50)

## 2018-01-21 LAB — VITAMIN B12

## 2018-01-24 ENCOUNTER — Encounter: Payer: Self-pay | Admitting: Family Medicine

## 2018-01-24 ENCOUNTER — Ambulatory Visit (INDEPENDENT_AMBULATORY_CARE_PROVIDER_SITE_OTHER): Payer: BC Managed Care – PPO | Admitting: Family Medicine

## 2018-01-24 VITALS — BP 134/80 | HR 94 | Temp 98.1°F | Resp 12 | Ht 65.0 in | Wt 152.0 lb

## 2018-01-24 DIAGNOSIS — D649 Anemia, unspecified: Secondary | ICD-10-CM | POA: Diagnosis not present

## 2018-01-24 DIAGNOSIS — Z23 Encounter for immunization: Secondary | ICD-10-CM | POA: Diagnosis not present

## 2018-01-24 DIAGNOSIS — D510 Vitamin B12 deficiency anemia due to intrinsic factor deficiency: Secondary | ICD-10-CM

## 2018-01-24 DIAGNOSIS — Z0000 Encounter for general adult medical examination without abnormal findings: Secondary | ICD-10-CM | POA: Diagnosis not present

## 2018-01-24 DIAGNOSIS — I10 Essential (primary) hypertension: Secondary | ICD-10-CM

## 2018-01-24 DIAGNOSIS — E782 Mixed hyperlipidemia: Secondary | ICD-10-CM

## 2018-01-24 LAB — IRON: Iron: 51 ug/dL (ref 45–160)

## 2018-01-24 NOTE — Progress Notes (Signed)
Subjective:    Patient ID: Sherry Hart, female    DOB: 01-03-1961, 57 y.o.   MRN: [id number]  HPI Here for CPE.  Mammogram was performed 08/2017 and was normal.  Pap smear was performed in 2017 and was normal.  Colonoscopy was performed in 2017 and was normal.  She is seeing Dr. Dwyane Dee to manage her diabetes and her HgA1c is reportedly around 6.5!.  Diabetic eye exam and diabetic foot exam is up to date.  Most recent labs are listed below: Lab on 01/20/2018  Component Date Value Ref Range Status  . WBC 01/20/2018 5.7  3.8 - 10.8 Thousand/uL Final  . RBC 01/20/2018 4.20  3.80 - 5.10 Million/uL Final  . Hemoglobin 01/20/2018 10.9* 11.7 - 15.5 g/dL Final  . HCT 01/20/2018 34.5* 35.0 - 45.0 % Final  . MCV 01/20/2018 82.1  80.0 - 100.0 fL Final  . MCH 01/20/2018 26.0* 27.0 - 33.0 pg Final  . MCHC 01/20/2018 31.6* 32.0 - 36.0 g/dL Final  . RDW 01/20/2018 14.4  11.0 - 15.0 % Final  . Platelets 01/20/2018 307  140 - 400 Thousand/uL Final  . MPV 01/20/2018 10.8  7.5 - 12.5 fL Final  . Neutro Abs 01/20/2018 3,511  1,500 - 7,800 cells/uL Final  . Lymphs Abs 01/20/2018 1,647  850 - 3,900 cells/uL Final  . WBC mixed population 01/20/2018 422  200 - 950 cells/uL Final  . Eosinophils Absolute 01/20/2018 68  15 - 500 cells/uL Final  . Basophils Absolute 01/20/2018 51  0 - 200 cells/uL Final  . Neutrophils Relative % 01/20/2018 61.6  % Final  . Total Lymphocyte 01/20/2018 28.9  % Final  . Monocytes Relative 01/20/2018 7.4  % Final  . Eosinophils Relative 01/20/2018 1.2  % Final  . Basophils Relative 01/20/2018 0.9  % Final  . Glucose, Bld 01/20/2018 99  65 - 99 mg/dL Final   Comment: .            Fasting reference interval .   . BUN 01/20/2018 17  7 - 25 mg/dL Final  . Creat 01/20/2018 1.08* 0.50 - 1.05 mg/dL Final   Comment: For patients >61 years of age, the reference limit for Creatinine is approximately 13% higher for people identified as African-American. .   Havery Moros Ratio  01/20/2018 16  6 - 22 (calc) Final  . Sodium 01/20/2018 142  135 - 146 mmol/L Final  . Potassium 01/20/2018 4.7  3.5 - 5.3 mmol/L Final  . Chloride 01/20/2018 105  98 - 110 mmol/L Final  . CO2 01/20/2018 27  20 - 32 mmol/L Final  . Calcium 01/20/2018 9.5  8.6 - 10.4 mg/dL Final  . Total Protein 01/20/2018 7.8  6.1 - 8.1 g/dL Final  . Albumin 01/20/2018 4.2  3.6 - 5.1 g/dL Final  . Globulin 01/20/2018 3.6  1.9 - 3.7 g/dL (calc) Final  . AG Ratio 01/20/2018 1.2  1.0 - 2.5 (calc) Final  . Total Bilirubin 01/20/2018 0.6  0.2 - 1.2 mg/dL Final  . Alkaline phosphatase (APISO) 01/20/2018 76  33 - 130 U/L Final  . AST 01/20/2018 15  10 - 35 U/L Final  . ALT 01/20/2018 9  6 - 29 U/L Final  . Cholesterol 01/20/2018 148  <200 mg/dL Final  . HDL 01/20/2018 42* >50 mg/dL Final  . Triglycerides 01/20/2018 66  <150 mg/dL Final  . LDL Cholesterol (Calc) 01/20/2018 91  mg/dL (calc) Final   Comment: Reference range: <100 . Desirable range <100  mg/dL for primary prevention;   <70 mg/dL for patients with CHD or diabetic patients  with > or = 2 CHD risk factors. Marland Kitchen LDL-C is now calculated using the Martin-Hopkins  calculation, which is a validated novel method providing  better accuracy than the Friedewald equation in the  estimation of LDL-C.  Cresenciano Genre et al. Annamaria Helling. 1610;960(45): 2061-2068  (http://education.[facility].com/faq/FAQ164)   . Total CHOL/HDL Ratio 01/20/2018 3.5  <5.0 (calc) Final  . Non-HDL Cholesterol (Calc) 01/20/2018 106  <130 mg/dL (calc) Final   Comment: For patients with diabetes plus 1 major ASCVD risk  factor, treating to a non-HDL-C goal of <100 mg/dL  (LDL-C of <70 mg/dL) is considered a therapeutic  option.   . TSH 01/20/2018 2.02  0.40 - 4.50 mIU/L Final  . Vitamin B-12 01/20/2018 >2,000* 200 - 1,100 pg/mL Final   Significant for mild anemia and low HDL  Immunization record is listed below, she is due for pneumovax 23: Immunization History  Administered Date(s)  Administered  . Influenza,inj,Quad PF,6+ Mos 03/27/2015, 03/06/2016, 03/15/2017  . Influenza-Unspecified 04/12/2014  . Tdap 08/13/2014   Past Medical History:  Diagnosis Date  . Abdominal pain   . Anemia   . B12 deficiency   . Biliary colic   . Constipation    occasionally not chronic per pt. -no medicines for this   . Diabetes mellitus 12 yrs ago   . Hyperlipidemia   . Hypertension   . Pernicious anemia   . Unexplained weight loss   . Vomiting   . Weakness    Past Surgical History:  Procedure Laterality Date  . CESAREAN SECTION  11/29/1990, 04/06/1994  . COLONOSCOPY  09/01/2004   all normal - in epic  . KNEE SURGERY  11/2000   right  . MOUTH SURGERY  04/04/1987  . UPPER GASTROINTESTINAL ENDOSCOPY  09-01-04   gastric polyp- m.johnson- in epic  . WISDOM TOOTH EXTRACTION     Current Outpatient Medications on File Prior to Visit  Medication Sig Dispense Refill  . aspirin 81 MG tablet Take 81 mg by mouth daily.    Marland Kitchen atorvastatin (LIPITOR) 20 MG tablet TAKE 1 TABLET BY MOUTH EVERY DAY 90 tablet 1  . Blood Glucose Monitoring Suppl (ONE TOUCH ULTRA 2) W/DEVICE KIT Use to check blood sugar 3 times per day dx code E11.65 1 each 0  . glimepiride (AMARYL) 1 MG tablet TAKE 1 TABLET WITH SUPPER EVERY DAY (Patient taking differently: TAKE 1/2 TABLET WITH SUPPER EVERY DAY) 30 tablet 2  . glucose blood test strip Use as instructed 100 each 12  . JARDIANCE 25 MG TABS tablet TAKE 1 TABLET BY MOUTH EVERY DAY 90 tablet 1  . Lancets (ONETOUCH ULTRASOFT) lancets Use as instructed 100 each 12  . losartan (COZAAR) 100 MG tablet TAKE 1 TABLET BY MOUTH DAILY 30 tablet 4  . SitaGLIPtin-MetFORMIN HCl (JANUMET XR) [phone] MG TB24 Take 1 tablet by mouth daily. 30 tablet 3  . TRULICITY 4.09 WJ/1.9JY SOPN INJECT IN THE ABDOMINAL SKIN AS DIRECTED ONCE A WEEK 4 pen 4   Current Facility-Administered Medications on File Prior to Visit  Medication Dose Route Frequency Provider Last Rate Last Dose  .  cyanocobalamin ((VITAMIN B-12)) injection 1,000 mcg  1,000 mcg Subcutaneous Q30 days Susy Frizzle, MD   1,000 mcg at 05/03/17 1717   Allergies  Allergen Reactions  . Penicillins Hives    Back of neck and upper back.   Social History   Socioeconomic History  . Marital  status: Married    Spouse name: Not on file  . Number of children: Not on file  . Years of education: Not on file  . Highest education level: Not on file  Occupational History  . Not on file  Social Needs  . Financial resource strain: Not on file  . Food insecurity:    Worry: Not on file    Inability: Not on file  . Transportation needs:    Medical: Not on file    Non-medical: Not on file  Tobacco Use  . Smoking status: Never Smoker  . Smokeless tobacco: Never Used  Substance and Sexual Activity  . Alcohol use: No    Alcohol/week: 0.0 standard drinks  . Drug use: No  . Sexual activity: Not on file  Lifestyle  . Physical activity:    Days per week: Not on file    Minutes per session: Not on file  . Stress: Not on file  Relationships  . Social connections:    Talks on phone: Not on file    Gets together: Not on file    Attends religious service: Not on file    Active member of club or organization: Not on file    Attends meetings of clubs or organizations: Not on file    Relationship status: Not on file  . Intimate partner violence:    Fear of current or ex partner: Not on file    Emotionally abused: Not on file    Physically abused: Not on file    Forced sexual activity: Not on file  Other Topics Concern  . Not on file  Social History Narrative  . Not on file   Family History  Problem Relation Age of Onset  . Cancer Mother        breast  . Breast cancer Mother 61  . Diabetes Father   . Thyroid disease Sister   . Hypertension Neg Hx   . Colon cancer Neg Hx   . Colon polyps Neg Hx   . Rectal cancer Neg Hx   . Stomach cancer Neg Hx       Review of Systems  All other systems  reviewed and are negative.      Objective:   Physical Exam  Constitutional: She is oriented to person, place, and time. She appears well-developed and well-nourished.  HENT:  Head: Normocephalic and atraumatic.  Right Ear: External ear normal.  Left Ear: External ear normal.  Nose: Nose normal.  Mouth/Throat: Oropharynx is clear and moist. No oropharyngeal exudate.  Eyes: Pupils are equal, round, and reactive to light. Conjunctivae and EOM are normal. Right eye exhibits no discharge. Left eye exhibits no discharge. No scleral icterus.  Neck: Normal range of motion. Neck supple. No JVD present. No tracheal deviation present. No thyromegaly present.  Cardiovascular: Normal rate, regular rhythm, normal heart sounds and intact distal pulses. Exam reveals no gallop and no friction rub.  No murmur heard. Pulmonary/Chest: Effort normal and breath sounds normal. No stridor. No respiratory distress. She has no wheezes. She has no rales. She exhibits no tenderness.  Abdominal: Soft. Bowel sounds are normal. She exhibits no distension and no mass. There is no tenderness. There is no rebound and no guarding. No hernia.  Musculoskeletal: Normal range of motion. She exhibits no edema, tenderness or deformity.  Lymphadenopathy:    She has no cervical adenopathy.  Neurological: She is alert and oriented to person, place, and time. She displays normal reflexes. No cranial nerve deficit  or sensory deficit. She exhibits normal muscle tone. Coordination normal.  Skin: Skin is warm and dry. Capillary refill takes less than 2 seconds. No rash noted. No erythema. No pallor.  Psychiatric: She has a normal mood and affect.  Vitals reviewed.         Assessment & Plan:  Anemia, unspecified type - Plan: Fecal Globin By Immunochemistry, Iron  Vitamin B12 deficiency anemia due to intrinsic factor deficiency  Essential hypertension, benign  General medical exam  Mixed hyperlipidemia  To work up her  anemia, I will check an iron level and stool test.  I believe she can transition to B12, 100 mcg poqday for her b12 deficiency.  Diabetes is managed by endocrinology.  LDL is excellent.  Increase aerobic exercise to 30 min a day 5 days a week for low HDL. She received pneumovax 23.  Diabetic eye and foot exams are UTD.  Cancer screening is utd.  Colonoscopy is due 2022, Mammogram in 2020, and pap in 2020.

## 2018-01-25 ENCOUNTER — Other Ambulatory Visit: Payer: BC Managed Care – PPO

## 2018-01-25 NOTE — Addendum Note (Signed)
Addended by: Shary Decamp B on: 01/25/2018 08:20 AM   Modules accepted: Orders

## 2018-01-26 ENCOUNTER — Other Ambulatory Visit: Payer: Self-pay | Admitting: Endocrinology

## 2018-01-26 ENCOUNTER — Other Ambulatory Visit: Payer: Self-pay | Admitting: Family Medicine

## 2018-01-26 DIAGNOSIS — D539 Nutritional anemia, unspecified: Secondary | ICD-10-CM

## 2018-01-26 DIAGNOSIS — D649 Anemia, unspecified: Secondary | ICD-10-CM

## 2018-01-26 LAB — FECAL GLOBIN BY IMMUNOCHEMISTRY
FECAL GLOBIN RESULT: NOT DETECTED
MICRO NUMBER:: 90960338
SPECIMEN QUALITY:: ADEQUATE

## 2018-01-30 ENCOUNTER — Other Ambulatory Visit: Payer: Self-pay | Admitting: Endocrinology

## 2018-02-19 ENCOUNTER — Other Ambulatory Visit: Payer: Self-pay | Admitting: Endocrinology

## 2018-02-19 DIAGNOSIS — E1165 Type 2 diabetes mellitus with hyperglycemia: Secondary | ICD-10-CM

## 2018-02-21 ENCOUNTER — Other Ambulatory Visit (INDEPENDENT_AMBULATORY_CARE_PROVIDER_SITE_OTHER): Payer: BC Managed Care – PPO

## 2018-02-21 DIAGNOSIS — E1165 Type 2 diabetes mellitus with hyperglycemia: Secondary | ICD-10-CM | POA: Diagnosis not present

## 2018-02-21 LAB — BASIC METABOLIC PANEL
BUN: 18 mg/dL (ref 6–23)
CHLORIDE: 102 meq/L (ref 96–112)
CO2: 27 mEq/L (ref 19–32)
Calcium: 10 mg/dL (ref 8.4–10.5)
Creatinine, Ser: 1.34 mg/dL — ABNORMAL HIGH (ref 0.40–1.20)
GFR: 52.23 mL/min — AB (ref >60.00)
GLUCOSE: 110 mg/dL — AB (ref 70–99)
Potassium: 4.8 mEq/L (ref 3.5–5.1)
SODIUM: 138 meq/L (ref 135–145)

## 2018-02-21 LAB — HEMOGLOBIN A1C: Hgb A1c MFr Bld: 6.6 % — ABNORMAL HIGH (ref 4.6–6.5)

## 2018-02-21 LAB — URINALYSIS, ROUTINE W REFLEX MICROSCOPIC
Bilirubin Urine: NEGATIVE
Hgb urine dipstick: NEGATIVE
KETONES UR: NEGATIVE
Leukocytes, UA: NEGATIVE
Nitrite: NEGATIVE
RBC / HPF: NONE SEEN (ref 0–?)
SPECIFIC GRAVITY, URINE: 1.02 (ref 1.000–1.030)
TOTAL PROTEIN, URINE-UPE24: NEGATIVE
UROBILINOGEN UA: 0.2 (ref 0.0–1.0)
pH: 5.5 (ref 5.0–8.0)

## 2018-02-21 LAB — MICROALBUMIN / CREATININE URINE RATIO
Creatinine,U: 79.3 mg/dL
MICROALB UR: 1.3 mg/dL (ref 0.0–1.9)
Microalb Creat Ratio: 1.6 mg/g (ref 0.0–30.0)

## 2018-02-24 ENCOUNTER — Ambulatory Visit: Payer: BC Managed Care – PPO | Admitting: Endocrinology

## 2018-02-24 ENCOUNTER — Encounter: Payer: Self-pay | Admitting: Endocrinology

## 2018-02-24 VITALS — BP 132/86 | HR 95 | Ht 65.0 in | Wt 150.0 lb

## 2018-02-24 DIAGNOSIS — Z23 Encounter for immunization: Secondary | ICD-10-CM | POA: Diagnosis not present

## 2018-02-24 DIAGNOSIS — E1165 Type 2 diabetes mellitus with hyperglycemia: Secondary | ICD-10-CM

## 2018-02-24 NOTE — Progress Notes (Signed)
Patient ID: Sherry Hart, female   DOB: June 15, 1961, 57 y.o.   MRN: [id number]   Reason for Appointment : Followup of diabetes   History of Present Illness          Problem 1: Type 2 diabetes mellitus, date of diagnosis: 2002       Past history: She had been treated with metformin since diagnosis and previous records are not available for review. She thinks that when she was taking her blood sugar regularly there were usually below 150 in the morning In 2012 because of lack of insurance she did not take her medications or check her sugar for about a year In 6/13 she was evaluated in the emergency room for abdominal pain and was found to have an A1c of 14% with very high blood sugars. Her only symptoms were weight loss, treated with insulin for short-term. She was seen for initial consultation in 1/15 and at that time her glucose was 431 with A1c of 13.1 Initially treated with insulin but this was later replaced with oral agents She started taking Kombiglyze XR on 08/08/13 and had gone up to 2 tablets daily Because of tendency to relatively higher fasting readings she was given low-dose Amaryl in 3/15 in addition  She has been on SGLT2 drugs, Invokana or Jardiance since 10/2014  She has been on Trulicity 6.81 mg weekly since 12/05/16 when her A1c was 8.3  Recent history:   Oral hypoglycemic drugs: Trulicity 2.75 mg weekly, Glimeperide 0.5 mg acs, Janumet XR 100/1000 daily, Jardiance 25 mg at dinner  A1c is now 6.6 compared to 6.4 previously and generally stable  Current blood sugars at home and management:  She has been able to walk and be more active this summer although her weight is down only a pound or so  She does not like to check her blood sugars and has only 5 readings in the last month, asking about switching to freestyle libre today  Also does not check readings after meals  Her lab glucose was 110  Again no side effects from any of her medications and no  hypoglycemia with Amaryl  She does tolerate metformin better in the form of Janumet   Side effects from medications have been:  GI side effects from 2000 mg metformin ER  Glucose monitoring:  done infrequently, mostly in the morning        Glucometer:  One Touch      Blood Glucose readings by review of monitor shows range of 91-132 with only 5 readings mostly in the morning    Glycemic control:   Lab Results  Component Value Date   HGBA1C 6.6 (H) 02/21/2018   HGBA1C 6.4 10/11/2017   HGBA1C 6.6 (H) 06/14/2017   Lab Results  Component Value Date   MICROALBUR 1.3 02/21/2018   LDLCALC 91 01/20/2018   CREATININE 1.34 (H) 02/21/2018   Self-care: The diet that the patient has been following is:  usually low fat, low carbs, Variable.    Usually has fruit for snacks   Avoiding drinks with sugar, dinner is at 5-7 pm            Exercise: She is trying to do some walking but mostly at work    Dietician visit: Most recent:  01/01/17              Compliance with the medical regimen: fair  Weight history: 125 upto 173, her lowest weight was in 11/2011  Wt Readings  from Last 3 Encounters:  02/24/18 150 lb (68 kg)  01/24/18 152 lb (68.9 kg)  10/14/17 151 lb (68.5 kg)   Lab on 02/21/2018  Component Date Value Ref Range Status  . Color, Urine 02/21/2018 YELLOW  Yellow;Lt. Yellow Final  . APPearance 02/21/2018 CLEAR  Clear Final  . Specific Gravity, Urine 02/21/2018 1.020  1.000 - 1.030 Final  . pH 02/21/2018 5.5  5.0 - 8.0 Final  . Total Protein, Urine 02/21/2018 NEGATIVE  Negative Final  . Urine Glucose 02/21/2018 >=1000* Negative Final  . Ketones, ur 02/21/2018 NEGATIVE  Negative Final  . Bilirubin Urine 02/21/2018 NEGATIVE  Negative Final  . Hgb urine dipstick 02/21/2018 NEGATIVE  Negative Final  . Urobilinogen, UA 02/21/2018 0.2  0.0 - 1.0 Final  . Leukocytes, UA 02/21/2018 NEGATIVE  Negative Final  . Nitrite 02/21/2018 NEGATIVE  Negative Final  . WBC, UA 02/21/2018 0-2/hpf   0-2/hpf Final  . RBC / HPF 02/21/2018 none seen  0-2/hpf Final  . Squamous Epithelial / LPF 02/21/2018 Rare(0-4/hpf)  Rare(0-4/hpf) Final  . Microalb, Ur 02/21/2018 1.3  0.0 - 1.9 mg/dL Final  . Creatinine,U 02/21/2018 79.3  mg/dL Final  . Microalb Creat Ratio 02/21/2018 1.6  0.0 - 30.0 mg/g Final  . Sodium 02/21/2018 138  135 - 145 mEq/L Final  . Potassium 02/21/2018 4.8  3.5 - 5.1 mEq/L Final  . Chloride 02/21/2018 102  96 - 112 mEq/L Final  . CO2 02/21/2018 27  19 - 32 mEq/L Final  . Glucose, Bld 02/21/2018 110* 70 - 99 mg/dL Final  . BUN 02/21/2018 18  6 - 23 mg/dL Final  . Creatinine, Ser 02/21/2018 1.34* 0.40 - 1.20 mg/dL Final  . Calcium 02/21/2018 10.0  8.4 - 10.5 mg/dL Final  . GFR 02/21/2018 52.23* >60.00 mL/min Final  . Hgb A1c MFr Bld 02/21/2018 6.6* 4.6 - 6.5 % Final   Glycemic Control Guidelines for People with Diabetes:Non Diabetic:  <6%Goal of Therapy: <7%Additional Action Suggested:  >8%    Other active problems: See review of systems     Allergies as of 02/24/2018      Reactions   Penicillins Hives   Back of neck and upper back.      Medication List        Accurate as of 02/24/18  4:11 PM. Always use your most recent med list.          aspirin 81 MG tablet Take 81 mg by mouth daily.   atorvastatin 20 MG tablet Commonly known as:  LIPITOR TAKE 1 TABLET BY MOUTH EVERY DAY   glimepiride 1 MG tablet Commonly known as:  AMARYL TAKE 1 TABLET WITH SUPPER EVERY DAY   glucose blood test strip Use as instructed   JANUMET XR [phone] MG Tb24 Generic drug:  SitaGLIPtin-MetFORMIN HCl TAKE 1 TABLET BY MOUTH EVERY DAY   JARDIANCE 25 MG Tabs tablet Generic drug:  empagliflozin TAKE 1 TABLET BY MOUTH EVERY DAY   losartan 100 MG tablet Commonly known as:  COZAAR TAKE 1 TABLET BY MOUTH DAILY   ONE TOUCH ULTRA 2 w/Device Kit Use to check blood sugar 3 times per day dx code E11.65   onetouch ultrasoft lancets Use as instructed   TRULICITY 1.77 LT/9.0ZE  Sopn Generic drug:  Dulaglutide INJECT IN THE ABDOMINAL SKIN AS DIRECTED ONCE A WEEK       Allergies:  Allergies  Allergen Reactions  . Penicillins Hives    Back of neck and upper back.  Past Medical History:  Diagnosis Date  . Abdominal pain   . Anemia   . B12 deficiency   . Biliary colic   . Constipation    occasionally not chronic per pt. -no medicines for this   . Diabetes mellitus 12 yrs ago   . Hyperlipidemia   . Hypertension   . Pernicious anemia   . Unexplained weight loss   . Vomiting   . Weakness     Past Surgical History:  Procedure Laterality Date  . CESAREAN SECTION  11/29/1990, 04/06/1994  . COLONOSCOPY  09/01/2004   all normal - in epic  . KNEE SURGERY  11/2000   right  . MOUTH SURGERY  04/04/1987  . UPPER GASTROINTESTINAL ENDOSCOPY  09-01-04   gastric polyp- m.johnson- in epic  . WISDOM TOOTH EXTRACTION      Family History  Problem Relation Age of Onset  . Cancer Mother        breast  . Breast cancer Mother 21  . Diabetes Father   . Thyroid disease Sister   . Hypertension Neg Hx   . Colon cancer Neg Hx   . Colon polyps Neg Hx   . Rectal cancer Neg Hx   . Stomach cancer Neg Hx     Social History:  reports that she has never smoked. She has never used smokeless tobacco. She reports that she does not drink alcohol or use drugs.    Review of Systems     HYPERTENSION: blood pressure has been treated with Cozaar 134m   Her blood pressure tends to be higher in the office at times including the first reading today   She has checked readings at home, recently 130/76-83  BP Readings from Last 3 Encounters:  02/24/18 132/86  01/24/18 134/80  10/14/17 136/84   RENAL function: This is slightly worse She does not take any ibuprofen or Aleve OTC No new blood pressure medications  Lab Results  Component Value Date   CREATININE 1.34 (H) 02/21/2018   CREATININE 1.08 (H) 01/20/2018   CREATININE 1.09 10/11/2017     Lipids:   She has  been taking Lipitor from PCP LDL is now controlled with the 20 mg dosage    Lab Results  Component Value Date   CHOL 148 01/20/2018   HDL 42 (L) 01/20/2018   LDLCALC 91 01/20/2018   LDLDIRECT 175.6 07/28/2013   TRIG 66 01/20/2018   CHOLHDL 3.5 01/20/2018    She has vitamin B-12 deficiency and is on treatment monthly from PCP Also has significant anemia and some iron deficiency   Physical Examination:  BP 132/86   Pulse 95   Ht _0  (1.651 m)   Wt 150 lb (68 kg)   SpO2 96%   BMI 24.96 kg/m      ASSESSMENT/PLAN:   Diabetes type 2, nonobese  See history of present illness for  discussion of  current management, blood sugar patterns and problems identified  Her A1c is excellent and now 6.6 although slightly higher than before  She is on multiple medications including Trulicity and Jardiance and low-dose Amaryl Generally trying to do well with exercise and diet However she wants to use freestyle libre for monitoring as she does not like to do fingersticks, not clear if this would be covered by insurance  RENAL dysfunction: Etiology is unclear since she has been on no nephrotoxic drugs and Jardiance has been continued for quite some time with her losartan  For now we will have her  try taking only half a tablet of Jardiance and have follow-up labs done with her PCP next month  Hypertension: Well controlled, may have slightly high readings in the office  Follow-up in 3 months  There are no Patient Instructions on file for this visit.   Elayne Snare 02/24/2018, 4:11 PM   Note: This office note was prepared with Dragon voice recognition system technology. Any transcriptional errors that result from this process are unintentional.

## 2018-02-24 NOTE — Patient Instructions (Addendum)
Check blood sugars on waking up  3/7  Also check blood sugars about 2 hours after a meal and do this after different meals by rotation  Recommended blood sugar levels on waking up is 90-130 and about 2 hours after meal is 130-160  Please bring your blood sugar monitor to each visit, thank you  Cut jardiance in 1/2   Increase fluids

## 2018-03-11 ENCOUNTER — Ambulatory Visit: Payer: BC Managed Care – PPO | Admitting: Family Medicine

## 2018-03-16 ENCOUNTER — Telehealth: Payer: Self-pay | Admitting: Family Medicine

## 2018-03-16 NOTE — Telephone Encounter (Signed)
Patient wants to know if she needs to fast for her blood work for her upcoming appointment 03/28/18? If so, she would like like to reschedule for an early morning appointment please.  Also patient is wanting to know if Dr.Kumar has reached out to Dr.Pickard regarding "checking her kidneys with her blood work."  Cb#: [phone]

## 2018-03-18 NOTE — Telephone Encounter (Signed)
No she does not need to be fasting - lipid not due til 11/19 and yes Kumar sent [place] a note - pt aware of all on vm.

## 2018-03-19 ENCOUNTER — Other Ambulatory Visit: Payer: Self-pay | Admitting: Endocrinology

## 2018-03-25 ENCOUNTER — Other Ambulatory Visit: Payer: Self-pay | Admitting: Endocrinology

## 2018-03-28 ENCOUNTER — Encounter: Payer: Self-pay | Admitting: Family Medicine

## 2018-03-28 ENCOUNTER — Ambulatory Visit (INDEPENDENT_AMBULATORY_CARE_PROVIDER_SITE_OTHER): Payer: BC Managed Care – PPO | Admitting: Family Medicine

## 2018-03-28 VITALS — BP 124/62 | HR 78 | Temp 97.9°F | Resp 14 | Ht 65.0 in | Wt 150.0 lb

## 2018-03-28 DIAGNOSIS — D509 Iron deficiency anemia, unspecified: Secondary | ICD-10-CM

## 2018-03-28 DIAGNOSIS — N183 Chronic kidney disease, stage 3 unspecified: Secondary | ICD-10-CM

## 2018-03-28 LAB — CBC WITH DIFFERENTIAL/PLATELET
BASOS PCT: 0.7 %
Basophils Absolute: 39 cells/uL (ref 0–200)
EOS ABS: 90 {cells}/uL (ref 15–500)
Eosinophils Relative: 1.6 %
HCT: 31.4 % — ABNORMAL LOW (ref 35.0–45.0)
HEMOGLOBIN: 10.2 g/dL — AB (ref 11.7–15.5)
LYMPHS ABS: 1674 {cells}/uL (ref 850–3900)
MCH: 26.6 pg — AB (ref 27.0–33.0)
MCHC: 32.5 g/dL (ref 32.0–36.0)
MCV: 81.8 fL (ref 80.0–100.0)
MPV: 11.4 fL (ref 7.5–12.5)
Monocytes Relative: 8.7 %
NEUTROS ABS: 3310 {cells}/uL (ref 1500–7800)
Neutrophils Relative %: 59.1 %
Platelets: 269 10*3/uL (ref 140–400)
RBC: 3.84 10*6/uL (ref 3.80–5.10)
RDW: 14.3 % (ref 11.0–15.0)
Total Lymphocyte: 29.9 %
WBC mixed population: 487 cells/uL (ref 200–950)
WBC: 5.6 10*3/uL (ref 3.8–10.8)

## 2018-03-28 NOTE — Progress Notes (Signed)
Subjective:    Patient ID: Sherry Hart, female    DOB: 04/28/1961, 57 y.o.   MRN: [id number]  HPI When I saw the patient in August, her creatinine was 1.08.  When she saw her endocrinologist a month later it had risen to 1.36.  He is requesting that we recheck her creatinine to monitor her renal function.  She is on [place], Janumet, and losartan.  She admits that she has not been drinking as much water as she should.  She denies any dysuria, urgency, frequency, or hematuria.  She denies any back pain.  She was also found to have a drop in her hemoglobin down to 10.9.  Stool study was negative for blood.  MCV was normal range however the patient is empirically taking iron given her history of iron deficiency anemia.  She is here today to recheck her hemoglobin. Past Medical History:  Diagnosis Date  . Abdominal pain   . Anemia   . B12 deficiency   . Biliary colic   . Constipation    occasionally not chronic per pt. -no medicines for this   . Diabetes mellitus 12 yrs ago   . Hyperlipidemia   . Hypertension   . Pernicious anemia   . Unexplained weight loss   . Vomiting   . Weakness    Past Surgical History:  Procedure Laterality Date  . CESAREAN SECTION  11/29/1990, 04/06/1994  . COLONOSCOPY  09/01/2004   all normal - in epic  . KNEE SURGERY  11/2000   right  . MOUTH SURGERY  04/04/1987  . UPPER GASTROINTESTINAL ENDOSCOPY  09-01-04   gastric polyp- m.johnson- in epic  . WISDOM TOOTH EXTRACTION     Current Outpatient Medications on File Prior to Visit  Medication Sig Dispense Refill  . aspirin 81 MG tablet Take 81 mg by mouth daily.    Marland Kitchen atorvastatin (LIPITOR) 20 MG tablet TAKE 1 TABLET BY MOUTH EVERY DAY 90 tablet 1  . Blood Glucose Monitoring Suppl (ONE TOUCH ULTRA 2) W/DEVICE KIT Use to check blood sugar 3 times per day dx code E11.65 1 each 0  . glimepiride (AMARYL) 1 MG tablet TAKE 1 TABLET WITH SUPPER EVERY DAY (Patient taking differently: TAKE 1/2 TABLET WITH SUPPER  EVERY DAY) 30 tablet 2  . glucose blood test strip Use as instructed 100 each 12  . JANUMET XR [phone] MG TB24 TAKE 1 TABLET BY MOUTH EVERY DAY 30 tablet 3  . JARDIANCE 25 MG TABS tablet TAKE 1 TABLET BY MOUTH EVERY DAY (Patient taking differently: 12.5 mg. ) 90 tablet 1  . Lancets (ONETOUCH ULTRASOFT) lancets Use as instructed 100 each 12  . losartan (COZAAR) 100 MG tablet TAKE 1 TABLET BY MOUTH DAILY 30 tablet 4  . TRULICITY 9.29 VF/4.7BU SOPN INJECT IN THE ABDOMINAL SKIN AS DIRECTED ONCE A WEEK 4 pen 4   Current Facility-Administered Medications on File Prior to Visit  Medication Dose Route Frequency Provider Last Rate Last Dose  . cyanocobalamin ((VITAMIN B-12)) injection 1,000 mcg  1,000 mcg Subcutaneous Q30 days Susy Frizzle, MD   1,000 mcg at 05/03/17 1717   Allergies  Allergen Reactions  . Penicillins Hives    Back of neck and upper back.   Social History   Socioeconomic History  . Marital status: Married    Spouse name: Not on file  . Number of children: Not on file  . Years of education: Not on file  . Highest education level: Not on file  Occupational History  . Not on file  Social Needs  . Financial resource strain: Not on file  . Food insecurity:    Worry: Not on file    Inability: Not on file  . Transportation needs:    Medical: Not on file    Non-medical: Not on file  Tobacco Use  . Smoking status: Never Smoker  . Smokeless tobacco: Never Used  Substance and Sexual Activity  . Alcohol use: No    Alcohol/week: 0.0 standard drinks  . Drug use: No  . Sexual activity: Not on file  Lifestyle  . Physical activity:    Days per week: Not on file    Minutes per session: Not on file  . Stress: Not on file  Relationships  . Social connections:    Talks on phone: Not on file    Gets together: Not on file    Attends religious service: Not on file    Active member of club or organization: Not on file    Attends meetings of clubs or organizations: Not on  file    Relationship status: Not on file  . Intimate partner violence:    Fear of current or ex partner: Not on file    Emotionally abused: Not on file    Physically abused: Not on file    Forced sexual activity: Not on file  Other Topics Concern  . Not on file  Social History Narrative  . Not on file   Family History  Problem Relation Age of Onset  . Cancer Mother        breast  . Breast cancer Mother 67  . Diabetes Father   . Thyroid disease Sister   . Hypertension Neg Hx   . Colon cancer Neg Hx   . Colon polyps Neg Hx   . Rectal cancer Neg Hx   . Stomach cancer Neg Hx       Review of Systems  All other systems reviewed and are negative.      Objective:   Physical Exam  Constitutional: She is oriented to person, place, and time. She appears well-developed and well-nourished.  HENT:  Head: Normocephalic and atraumatic.  Right Ear: External ear normal.  Left Ear: External ear normal.  Nose: Nose normal.  Mouth/Throat: Oropharynx is clear and moist. No oropharyngeal exudate.  Eyes: Pupils are equal, round, and reactive to light. Conjunctivae and EOM are normal. Right eye exhibits no discharge. Left eye exhibits no discharge. No scleral icterus.  Neck: Normal range of motion. Neck supple. No JVD present. No tracheal deviation present. No thyromegaly present.  Cardiovascular: Normal rate, regular rhythm, normal heart sounds and intact distal pulses. Exam reveals no gallop and no friction rub.  No murmur heard. Pulmonary/Chest: Effort normal and breath sounds normal. No stridor. No respiratory distress. She has no wheezes. She has no rales. She exhibits no tenderness.  Abdominal: Soft. Bowel sounds are normal. She exhibits no distension and no mass. There is no tenderness. There is no rebound and no guarding. No hernia.  Musculoskeletal: Normal range of motion. She exhibits no edema, tenderness or deformity.  Lymphadenopathy:    She has no cervical adenopathy.    Neurological: She is alert and oriented to person, place, and time. She displays normal reflexes. No cranial nerve deficit or sensory deficit. She exhibits normal muscle tone. Coordination normal.  Skin: Skin is warm and dry. Capillary refill takes less than 2 seconds. No rash noted. No erythema. No pallor.  Psychiatric:  She has a normal mood and affect.  Vitals reviewed.         Assessment & Plan:  Iron deficiency anemia, unspecified iron deficiency anemia type - Plan: CBC with Differential/Platelet, BASIC METABOLIC PANEL WITH GFR, Anemia panel  CKD (chronic kidney disease) stage 3, GFR 30-59 ml/min (HCC)  Recheck anemia panel today monitoring her iron and B12 levels now that the patient has been on oral iron and oral B12 for almost 2 months.  Fecal study was negative for blood.  Also recheck a BMP to monitor her creatinine.  If kidney function is declining, patient would may need a renal ultrasound and also may need to consider switching away from [place].  Await the results of the lab work

## 2018-04-01 LAB — TEST AUTHORIZATION

## 2018-04-01 LAB — ANEMIA PROFILE B
%SAT: 18 % (ref 16–45)
ABS Retic: 51090 cells/uL (ref 20000–8000)
BASOS PCT: 0.7 %
Basophils Absolute: 39 cells/uL (ref 0–200)
EOS ABS: 90 {cells}/uL (ref 15–500)
Eosinophils Relative: 1.6 %
FERRITIN: 28 ng/mL (ref 16–232)
Folate: 23.3 ng/mL
HCT: 31.4 % — ABNORMAL LOW (ref 35.0–45.0)
Hemoglobin: 10.2 g/dL — ABNORMAL LOW (ref 11.7–15.5)
IRON: 59 ug/dL (ref 45–160)
LYMPHS ABS: 1674 {cells}/uL (ref 850–3900)
MCH: 26.6 pg — ABNORMAL LOW (ref 27.0–33.0)
MCHC: 32.5 g/dL (ref 32.0–36.0)
MCV: 81.8 fL (ref 80.0–100.0)
MPV: 11.4 fL (ref 7.5–12.5)
Monocytes Relative: 8.7 %
NEUTROS ABS: 3310 {cells}/uL (ref 1500–7800)
Neutrophils Relative %: 59.1 %
Platelets: 269 10*3/uL (ref 140–400)
RBC: 3.84 10*6/uL (ref 3.80–5.10)
RDW: 14.3 % (ref 11.0–15.0)
Retic Ct Pct: 1.3 %
TIBC: 337 ug/dL (ref 250–450)
TOTAL LYMPHOCYTE: 29.9 %
Vitamin B-12: 753 pg/mL (ref 200–1100)
WBC: 5.6 10*3/uL (ref 3.8–10.8)
WBCMIX: 487 {cells}/uL (ref 200–950)

## 2018-04-01 LAB — BASIC METABOLIC PANEL WITH GFR
BUN / CREAT RATIO: 13 (calc) (ref 6–22)
BUN: 16 mg/dL (ref 7–25)
CO2: 28 mmol/L (ref 20–32)
CREATININE: 1.23 mg/dL — AB (ref 0.50–1.05)
Calcium: 9.4 mg/dL (ref 8.6–10.4)
Chloride: 102 mmol/L (ref 98–110)
GFR, EST AFRICAN AMERICAN: 56 mL/min/{1.73_m2} — AB (ref >=60)
GFR, Est Non African American: 48 mL/min/{1.73_m2} — ABNORMAL LOW (ref >=60)
Glucose, Bld: 118 mg/dL — ABNORMAL HIGH (ref 65–99)
Potassium: 4.7 mmol/L (ref 3.5–5.3)
Sodium: 137 mmol/L (ref 135–146)

## 2018-04-04 ENCOUNTER — Encounter: Payer: Self-pay | Admitting: *Deleted

## 2018-04-11 ENCOUNTER — Telehealth: Payer: Self-pay | Admitting: Endocrinology

## 2018-04-11 NOTE — Telephone Encounter (Signed)
Patient is stating they received kidney lab  results on the 14th and would like to know if they can stop taking the jardiance half a tablet. She would like to have her A1C checked and the Kidney function again come December to see if not taking the medication will help. Please Advise with patient.

## 2018-04-12 NOTE — Telephone Encounter (Signed)
Pt call back still have 1 bottle jardiance which taking half of tablet daily and also notified her with Dr. Maretta Bees instructions.

## 2018-04-12 NOTE — Telephone Encounter (Signed)
LVM--regarding medication and to call the office back

## 2018-04-12 NOTE — Telephone Encounter (Signed)
Please advise 

## 2018-04-12 NOTE — Telephone Encounter (Signed)
Please let her know she can continue Jardiance . Her GFR is 56 . If it goes below 45 then she needs to stop it

## 2018-04-16 ENCOUNTER — Other Ambulatory Visit: Payer: Self-pay | Admitting: Endocrinology

## 2018-04-25 ENCOUNTER — Other Ambulatory Visit: Payer: Self-pay | Admitting: Endocrinology

## 2018-05-18 ENCOUNTER — Other Ambulatory Visit: Payer: Self-pay | Admitting: Endocrinology

## 2018-05-18 DIAGNOSIS — E1165 Type 2 diabetes mellitus with hyperglycemia: Secondary | ICD-10-CM

## 2018-05-23 ENCOUNTER — Other Ambulatory Visit (INDEPENDENT_AMBULATORY_CARE_PROVIDER_SITE_OTHER): Payer: BC Managed Care – PPO

## 2018-05-23 DIAGNOSIS — E1165 Type 2 diabetes mellitus with hyperglycemia: Secondary | ICD-10-CM

## 2018-05-23 LAB — COMPREHENSIVE METABOLIC PANEL
ALBUMIN: 4.2 g/dL (ref 3.5–5.2)
ALK PHOS: 77 U/L (ref 39–117)
ALT: 11 U/L (ref 0–35)
AST: 17 U/L (ref 0–37)
BILIRUBIN TOTAL: 0.5 mg/dL (ref 0.2–1.2)
BUN: 15 mg/dL (ref 6–23)
CO2: 30 mEq/L (ref 19–32)
Calcium: 9.9 mg/dL (ref 8.4–10.5)
Chloride: 102 mEq/L (ref 96–112)
Creatinine, Ser: 1.14 mg/dL (ref 0.40–1.20)
GFR: 62.89 mL/min (ref >60.00)
GLUCOSE: 121 mg/dL — AB (ref 70–99)
Potassium: 4.9 mEq/L (ref 3.5–5.1)
Sodium: 138 mEq/L (ref 135–145)
TOTAL PROTEIN: 8.1 g/dL (ref 6.0–8.3)

## 2018-05-23 LAB — HEMOGLOBIN A1C: HEMOGLOBIN A1C: 6.8 % — AB (ref 4.6–6.5)

## 2018-05-26 ENCOUNTER — Ambulatory Visit: Payer: BC Managed Care – PPO | Admitting: Endocrinology

## 2018-05-26 ENCOUNTER — Encounter: Payer: Self-pay | Admitting: Endocrinology

## 2018-05-26 VITALS — BP 132/70 | HR 92 | Ht 65.0 in | Wt 153.6 lb

## 2018-05-26 DIAGNOSIS — E1165 Type 2 diabetes mellitus with hyperglycemia: Secondary | ICD-10-CM | POA: Diagnosis not present

## 2018-05-26 LAB — GLUCOSE, POCT (MANUAL RESULT ENTRY): POC Glucose: 156 mg/dl — AB (ref 70–99)

## 2018-05-26 NOTE — Progress Notes (Signed)
Patient ID: Sherry Hart, female   DOB: 04-14-1961, 57 y.o.   MRN: [id number]   Reason for Appointment : Followup of diabetes   History of Present Illness          Problem 1: Type 2 diabetes mellitus, date of diagnosis: 2002       Past history: She had been treated with metformin since diagnosis and previous records are not available for review. She thinks that when she was taking her blood sugar regularly there were usually below 150 in the morning In 2012 because of lack of insurance she did not take her medications or check her sugar for about a year In 6/13 she was evaluated in the emergency room for abdominal pain and was found to have an A1c of 14% with very high blood sugars. Her only symptoms were weight loss, treated with insulin for short-term. She was seen for initial consultation in 1/15 and at that time her glucose was 431 with A1c of 13.1 Initially treated with insulin but this was later replaced with oral agents She started taking Kombiglyze XR on 08/08/13 and had gone up to 2 tablets daily Because of tendency to relatively higher fasting readings she was given low-dose Amaryl in 3/15 in addition  She has been on SGLT2 drugs, Invokana or Jardiance since 10/2014  She has been on Trulicity 6.76 mg weekly since 12/05/16 when her A1c was 8.3  Recent history:   Oral hypoglycemic drugs: Trulicity 1.95 mg weekly, Glimeperide 0.5 mg acs, Janumet XR 100/1000 daily, Jardiance 12.5 mg at dinner  A1c is now 6.8, previous 6.6 in September  Current blood sugars at home and management:  Her Vania Rea was reduced to half a tablet previously because of increasing creatinine  However difficult to assess her home blood sugar control since she is only rarely monitoring her blood sugars  Fasting glucose was 121 in the lab  She has not been exercising much as she gets tired after work  Has been fairly compliant with her medication regimen including Trulicity which has not  caused any nausea  Her weight is trending higher    Side effects from medications have been:  GI side effects from 2000 mg metformin ER  Glucose monitoring:  done infrequently, does not remember readings      Glucometer:  One Touch      Blood Glucose readings n/a   Glycemic control:   Lab Results  Component Value Date   HGBA1C 6.8 (H) 05/23/2018   HGBA1C 6.6 (H) 02/21/2018   HGBA1C 6.4 10/11/2017   Lab Results  Component Value Date   MICROALBUR 1.3 02/21/2018   LDLCALC 91 01/20/2018   CREATININE 1.14 05/23/2018   Self-care: The diet that the patient has been following is:  usually low fat, low carbs, Variable.    Usually has fruit for snacks   Avoiding drinks with sugar, dinner is at 5-7 pm            Exercise: A little walking,  mostly at work    Dietician visit: Most recent:  01/01/17              Compliance with the medical regimen: fair  Weight history: 125 upto 173, her lowest weight was in 11/2011  Wt Readings from Last 3 Encounters:  05/26/18 153 lb 9.6 oz (69.7 kg)  03/28/18 150 lb (68 kg)  02/24/18 150 lb (68 kg)   Office Visit on 05/26/2018  Component Date Value Ref Range Status  .  POC Glucose 05/26/2018 156* 70 - 99 mg/dl Final  Lab on 05/23/2018  Component Date Value Ref Range Status  . Sodium 05/23/2018 138  135 - 145 mEq/L Final  . Potassium 05/23/2018 4.9  3.5 - 5.1 mEq/L Final  . Chloride 05/23/2018 102  96 - 112 mEq/L Final  . CO2 05/23/2018 30  19 - 32 mEq/L Final  . Glucose, Bld 05/23/2018 121* 70 - 99 mg/dL Final  . BUN 05/23/2018 15  6 - 23 mg/dL Final  . Creatinine, Ser 05/23/2018 1.14  0.40 - 1.20 mg/dL Final  . Total Bilirubin 05/23/2018 0.5  0.2 - 1.2 mg/dL Final  . Alkaline Phosphatase 05/23/2018 77  39 - 117 U/L Final  . AST 05/23/2018 17  0 - 37 U/L Final  . ALT 05/23/2018 11  0 - 35 U/L Final  . Total Protein 05/23/2018 8.1  6.0 - 8.3 g/dL Final  . Albumin 05/23/2018 4.2  3.5 - 5.2 g/dL Final  . Calcium 05/23/2018 9.9  8.4 -  10.5 mg/dL Final  . GFR 05/23/2018 62.89  >60.00 mL/min Final  . Hgb A1c MFr Bld 05/23/2018 6.8* 4.6 - 6.5 % Final   Glycemic Control Guidelines for People with Diabetes:Non Diabetic:  <6%Goal of Therapy: <7%Additional Action Suggested:  >8%    Other active problems: See review of systems     Allergies as of 05/26/2018      Reactions   Penicillins Hives   Back of neck and upper back.      Medication List       Accurate as of May 26, 2018  3:47 PM. Always use your most recent med list.        aspirin 81 MG tablet Take 81 mg by mouth daily.   atorvastatin 20 MG tablet Commonly known as:  LIPITOR TAKE 1 TABLET BY MOUTH EVERY DAY   ferrous sulfate 325 (65 FE) MG EC tablet Take 325 mg by mouth 3 (three) times daily with meals. Take 1 tablet by mouth daily.   JANUMET XR [phone] MG Tb24 Generic drug:  SitaGLIPtin-MetFORMIN HCl TAKE 1 TABLET BY MOUTH EVERY DAY   JARDIANCE 25 MG Tabs tablet Generic drug:  empagliflozin TAKE 1 TABLET BY MOUTH EVERY DAY   losartan 100 MG tablet Commonly known as:  COZAAR TAKE 1 TABLET BY MOUTH DAILY   ONE TOUCH ULTRA 2 w/Device Kit Use to check blood sugar 3 times per day dx code E11.65   onetouch ultrasoft lancets Use as instructed   TRULICITY 6.43 PI/9.5JO Sopn Generic drug:  Dulaglutide INJECT IN THE ABDOMINAL SKIN AS DIRECTED ONCE A WEEK   vitamin B-12 500 MCG tablet Commonly known as:  CYANOCOBALAMIN Take 1,000 mcg by mouth daily. Take 1 tablet by mouth daily.       Allergies:  Allergies  Allergen Reactions  . Penicillins Hives    Back of neck and upper back.    Past Medical History:  Diagnosis Date  . Abdominal pain   . Anemia   . B12 deficiency   . Biliary colic   . Constipation    occasionally not chronic per pt. -no medicines for this   . Diabetes mellitus 12 yrs ago   . Hyperlipidemia   . Hypertension   . Pernicious anemia   . Unexplained weight loss   . Vomiting   . Weakness     Past Surgical  History:  Procedure Laterality Date  . CESAREAN SECTION  11/29/1990, 04/06/1994  . COLONOSCOPY  09/01/2004  all normal - in epic  . KNEE SURGERY  11/2000   right  . MOUTH SURGERY  04/04/1987  . UPPER GASTROINTESTINAL ENDOSCOPY  09-01-04   gastric polyp- m.johnson- in epic  . WISDOM TOOTH EXTRACTION      Family History  Problem Relation Age of Onset  . Cancer Mother        breast  . Breast cancer Mother 71  . Diabetes Father   . Thyroid disease Sister   . Hypertension Neg Hx   . Colon cancer Neg Hx   . Colon polyps Neg Hx   . Rectal cancer Neg Hx   . Stomach cancer Neg Hx     Social History:  reports that she has never smoked. She has never used smokeless tobacco. She reports that she does not drink alcohol or use drugs.    Review of Systems     HYPERTENSION: blood pressure has been treated with Cozaar 144m   Her blood pressure tends to be higher in the office at times but recently excellent   She has checked readings at home, recently blood pressure 130/76-83  BP Readings from Last 3 Encounters:  05/26/18 132/70  03/28/18 124/62  02/24/18 132/86   RENAL function: This is improved with reducing Jardiance, losartan has not been reduced because of maintaining blood pressure control   Lab Results  Component Value Date   CREATININE 1.14 05/23/2018   CREATININE 1.23 (H) 03/28/2018   CREATININE 1.34 (H) 02/21/2018     Lipids:   She has been taking Lipitor from PCP LDL is controlled with the 20 mg dosage    Lab Results  Component Value Date   CHOL 148 01/20/2018   HDL 42 (L) 01/20/2018   LDLCALC 91 01/20/2018   LDLDIRECT 175.6 07/28/2013   TRIG 66 01/20/2018   CHOLHDL 3.5 01/20/2018       Physical Examination:  BP 132/70 (BP Location: Left Arm, Patient Position: Sitting, Cuff Size: Normal)   Pulse 92   Ht _0  (1.651 m)   Wt 153 lb 9.6 oz (69.7 kg)   SpO2 98%   BMI 25.56 kg/m      ASSESSMENT/PLAN:   Diabetes type 2, nonobese  See history  of present illness for  discussion of  current management, blood sugar patterns and problems identified  Her A1c is gradually going up and now 6.8  However she thinks that she can do a little better with diet and also start walking regularly for exercise Since Jardiance 25 mg had apparently tended to increase her creatinine will not increase this now May however benefit from higher dose of Trulicity especially with recent weight gain She wants to wait for the next visit to do this Excellent and now 6.6 although slightly higher than before Encouraged her to check a few blood sugars more often especially after meals  RENAL dysfunction: Resolved with reducing Jardiance No microalbuminuria   Hypertension: Well controlled, stay on Jardiance and losartan  Follow-up in 3 months  There are no Patient Instructions on file for this visit.   AElayne Snare12/05/2018, 3:47 PM   Note: This office note was prepared with Dragon voice recognition system technology. Any transcriptional errors that result from this process are unintentional.

## 2018-05-26 NOTE — Patient Instructions (Addendum)
Take 1/2 Jardiance  Check blood sugars on waking up days 2 a week  Also check blood sugars about 2 hours after meals and do this after different meals by rotation  Recommended blood sugar levels on waking up are 90-130 and about 2 hours after meal is 130-160  Please bring your blood sugar monitor to each visit, thank you  Walk daily  INDOOR EXERCISE IDEAS   Use the following examples for a creative indoor workout (perform each move for 2-3 minutes):   Warm up. Put on some music that makes you feel like moving, and dance around the living room.  Watch exercise shows on TV and move along with them. There are tons of free cable channels that have daily exercise shows on them for all levels - beginner through advanced.   You can easily find a number of exercise videos but use one that will suit your liking and exercise level; you can do these on your own schedule.  Walk up and down the steps.  Do dumbbell curls and presses (if you don't have weights, use full water bottles).  Do assisted squats, keeping your back on a fitness ball against the wall or using the back of the couch for support.  Shadow box: Lift and lower the left leg; jab with the right arm, then the left; then lift and lower the right leg.  Fence (you don't even need swords). Pretend you're holding a sword in each hand. Create an X pattern standing still, then moving forward and back.  Hop on your exercise bike or treadmill -- or, for something different, use a weighted hula hoop. If you don't have any of those, just go back to dancing.  Do abdominal crunches (hold a weighted ball for added resistance).  Cool down with [employer] "I Feel Good" -- or whatever tune makes you feel good

## 2018-06-06 ENCOUNTER — Other Ambulatory Visit: Payer: Self-pay | Admitting: Endocrinology

## 2018-07-16 ENCOUNTER — Other Ambulatory Visit: Payer: Self-pay | Admitting: Endocrinology

## 2018-08-03 ENCOUNTER — Other Ambulatory Visit: Payer: Self-pay | Admitting: Family Medicine

## 2018-08-03 DIAGNOSIS — Z1231 Encounter for screening mammogram for malignant neoplasm of breast: Secondary | ICD-10-CM

## 2018-08-29 ENCOUNTER — Other Ambulatory Visit: Payer: Self-pay

## 2018-08-29 ENCOUNTER — Other Ambulatory Visit (INDEPENDENT_AMBULATORY_CARE_PROVIDER_SITE_OTHER): Payer: BC Managed Care – PPO

## 2018-08-29 DIAGNOSIS — E1165 Type 2 diabetes mellitus with hyperglycemia: Secondary | ICD-10-CM

## 2018-08-29 LAB — BASIC METABOLIC PANEL
BUN: 19 mg/dL (ref 6–23)
CALCIUM: 9.9 mg/dL (ref 8.4–10.5)
CO2: 29 mEq/L (ref 19–32)
Chloride: 100 mEq/L (ref 96–112)
Creatinine, Ser: 1.29 mg/dL — ABNORMAL HIGH (ref 0.40–1.20)
GFR: 51.26 mL/min — ABNORMAL LOW (ref >60.00)
Glucose, Bld: 116 mg/dL — ABNORMAL HIGH (ref 70–99)
Potassium: 5 mEq/L (ref 3.5–5.1)
Sodium: 136 mEq/L (ref 135–145)

## 2018-08-29 LAB — HEMOGLOBIN A1C: Hgb A1c MFr Bld: 6.8 % — ABNORMAL HIGH (ref 4.6–6.5)

## 2018-09-01 ENCOUNTER — Other Ambulatory Visit: Payer: Self-pay

## 2018-09-01 ENCOUNTER — Ambulatory Visit: Payer: BC Managed Care – PPO | Admitting: Endocrinology

## 2018-09-01 ENCOUNTER — Encounter: Payer: Self-pay | Admitting: Endocrinology

## 2018-09-01 VITALS — BP 142/82 | HR 86 | Ht 65.0 in | Wt 152.4 lb

## 2018-09-01 DIAGNOSIS — E7800 Pure hypercholesterolemia, unspecified: Secondary | ICD-10-CM

## 2018-09-01 DIAGNOSIS — E1165 Type 2 diabetes mellitus with hyperglycemia: Secondary | ICD-10-CM

## 2018-09-01 DIAGNOSIS — I10 Essential (primary) hypertension: Secondary | ICD-10-CM | POA: Diagnosis not present

## 2018-09-01 MED ORDER — DULAGLUTIDE 0.75 MG/0.5ML ~~LOC~~ SOAJ: SUBCUTANEOUS | 4 refills | Status: DC

## 2018-09-01 NOTE — Patient Instructions (Addendum)
Check blood sugars on waking up days 2-3 a week  Also check blood sugars about 2 hours after meals and do this after different meals by rotation  Recommended blood sugar levels on waking up are 90-130 and about 2 hours after meal is 130-160  Please bring your blood sugar monitor to each visit, thank you  Check BP weekly

## 2018-09-01 NOTE — Progress Notes (Signed)
Patient ID: Sherry Hart, female   DOB: 1961/02/15, 58 y.o.   MRN: [id number]   Reason for Appointment : Followup of diabetes   History of Present Illness          Problem 1: Type 2 diabetes mellitus, date of diagnosis: 2002       Past history: She had been treated with metformin since diagnosis and previous records are not available for review. She thinks that when she was taking her blood sugar regularly there were usually below 150 in the morning In 2012 because of lack of insurance she did not take her medications or check her sugar for about a year In 6/13 she was evaluated in the emergency room for abdominal pain and was found to have an A1c of 14% with very high blood sugars. Her only symptoms were weight loss, treated with insulin for short-term. She was seen for initial consultation in 1/15 and at that time her glucose was 431 with A1c of 13.1 Initially treated with insulin but this was later replaced with oral agents She started taking Kombiglyze XR on 08/08/13 and had gone up to 2 tablets daily Because of tendency to relatively higher fasting readings she was given low-dose Amaryl in 3/15 in addition  She has been on SGLT2 drugs, Invokana or Jardiance since 10/2014  She has been on Trulicity 6.26 mg weekly since 12/05/16 when her A1c was 8.3  Recent history:   Non-insulin hypoglycemic drugs: Trulicity 9.48 mg weekly, Glimeperide 0.5 mg acs, Janumet XR 100/1000 daily, Jardiance 12.5 mg at dinner  A1c is again 6.8, previous 6.6 in September 2019  Current blood sugars at home and management:  Her Vania Rea was reduced to half a tablet previously because of increasing creatinine  Although her A1c is 6.8 her blood sugars are much lower than expected  Fasting glucose was 116 in the lab  She has been exercising fairly regularly since [facility] at the senior center  However recently because of closing of the senior center she has not done as much  Usually not  checking readings after meals  Her weight is about the same    Side effects from medications have been:  GI side effects from 2000 mg metformin ER  Glucose monitoring:  done infrequently, does not remember readings      Glucometer:  One Touch     Blood Glucose readings recently range 72-131 with only 4 readings, mostly before noon   Glycemic control:   Lab Results  Component Value Date   HGBA1C 6.8 (H) 08/29/2018   HGBA1C 6.8 (H) 05/23/2018   HGBA1C 6.6 (H) 02/21/2018   Lab Results  Component Value Date   MICROALBUR 1.3 02/21/2018   LDLCALC 91 01/20/2018   CREATININE 1.29 (H) 08/29/2018   Self-care: The diet that the patient has been following is:  usually low fat, low carbs, Variable.    Usually has fruit for snacks   Avoiding drinks with sugar, dinner is at 5-7 pm            Exercise: About 3 times a week at the senior center until recently    Dietician visit: Most recent:  01/01/17              Compliance with the medical regimen: fair  Weight history: 125 upto 173, her lowest weight was in 11/2011  Wt Readings from Last 3 Encounters:  09/01/18 152 lb 6.4 oz (69.1 kg)  05/26/18 153 lb 9.6 oz (69.7 kg)  03/28/18 150 lb (68 kg)   Lab on 08/29/2018  Component Date Value Ref Range Status  . Sodium 08/29/2018 136  135 - 145 mEq/L Final  . Potassium 08/29/2018 5.0  3.5 - 5.1 mEq/L Final  . Chloride 08/29/2018 100  96 - 112 mEq/L Final  . CO2 08/29/2018 29  19 - 32 mEq/L Final  . Glucose, Bld 08/29/2018 116* 70 - 99 mg/dL Final  . BUN 08/29/2018 19  6 - 23 mg/dL Final  . Creatinine, Ser 08/29/2018 1.29* 0.40 - 1.20 mg/dL Final  . Calcium 08/29/2018 9.9  8.4 - 10.5 mg/dL Final  . GFR 08/29/2018 51.26* >60.00 mL/min Final  . Hgb A1c MFr Bld 08/29/2018 6.8* 4.6 - 6.5 % Final   Glycemic Control Guidelines for People with Diabetes:Non Diabetic:  <6%Goal of Therapy: <7%Additional Action Suggested:  >8%    Other active problems: See review of systems     Allergies as  of 09/01/2018      Reactions   Penicillins Hives   Back of neck and upper back.      Medication List       Accurate as of September 01, 2018  3:42 PM. Always use your most recent med list.        aspirin 81 MG tablet Take 81 mg by mouth daily.   atorvastatin 20 MG tablet Commonly known as:  LIPITOR TAKE 1 TABLET BY MOUTH EVERY DAY   ferrous sulfate 325 (65 FE) MG EC tablet Take 325 mg by mouth 3 (three) times daily with meals. Take 1 tablet by mouth daily.   Janumet XR [phone] MG Tb24 Generic drug:  SitaGLIPtin-MetFORMIN HCl TAKE 1 TABLET BY MOUTH EVERY DAY   Jardiance 25 MG Tabs tablet Generic drug:  empagliflozin TAKE 1 TABLET BY MOUTH EVERY DAY   losartan 100 MG tablet Commonly known as:  COZAAR TAKE 1 TABLET BY MOUTH DAILY   ONE TOUCH ULTRA 2 w/Device Kit Use to check blood sugar 3 times per day dx code E11.65   onetouch ultrasoft lancets Use as instructed   Trulicity 7.32 KG/2.5KY Sopn Generic drug:  Dulaglutide INJECT IN THE ABDOMINAL SKIN AS DIRECTED ONCE A WEEK   vitamin B-12 500 MCG tablet Commonly known as:  CYANOCOBALAMIN Take 1,000 mcg by mouth daily. Take 1 tablet by mouth daily.       Allergies:  Allergies  Allergen Reactions  . Penicillins Hives    Back of neck and upper back.    Past Medical History:  Diagnosis Date  . Abdominal pain   . Anemia   . B12 deficiency   . Biliary colic   . Constipation    occasionally not chronic per pt. -no medicines for this   . Diabetes mellitus 12 yrs ago   . Hyperlipidemia   . Hypertension   . Pernicious anemia   . Unexplained weight loss   . Vomiting   . Weakness     Past Surgical History:  Procedure Laterality Date  . CESAREAN SECTION  11/29/1990, 04/06/1994  . COLONOSCOPY  09/01/2004   all normal - in epic  . KNEE SURGERY  11/2000   right  . MOUTH SURGERY  04/04/1987  . UPPER GASTROINTESTINAL ENDOSCOPY  09-01-04   gastric polyp- m.johnson- in epic  . WISDOM TOOTH EXTRACTION       Family History  Problem Relation Age of Onset  . Cancer Mother        breast  . Breast cancer Mother 48  .  Diabetes Father   . Thyroid disease Sister   . Hypertension Neg Hx   . Colon cancer Neg Hx   . Colon polyps Neg Hx   . Rectal cancer Neg Hx   . Stomach cancer Neg Hx     Social History:  reports that she has never smoked. She has never used smokeless tobacco. She reports that she does not drink alcohol or use drugs.    Review of Systems     HYPERTENSION: blood pressure has been treated with Cozaar 148m   Her blood pressure tends to be higher in the office at times   She has not checked readings at home recently and has not followed up with PCP  BP Readings from Last 3 Encounters:  09/01/18 (!) 142/82  05/26/18 132/70  03/28/18 124/62   RENAL function: This is been variable and now again creatinine is slightly above normal Previously had improved with cutting Jardiance in half   Lab Results  Component Value Date   CREATININE 1.29 (H) 08/29/2018   CREATININE 1.14 05/23/2018   CREATININE 1.23 (H) 03/28/2018     Lipids:   She has been taking Lipitor from PCP LDL is controlled with the 20 mg dosage    Lab Results  Component Value Date   CHOL 148 01/20/2018   HDL 42 (L) 01/20/2018   LDLCALC 91 01/20/2018   LDLDIRECT 175.6 07/28/2013   TRIG 66 01/20/2018   CHOLHDL 3.5 01/20/2018     Physical Examination:  BP (!) 142/82 (BP Location: Right Arm, Patient Position: Sitting, Cuff Size: Normal)   Pulse 86   Ht '5\' 5"'  (1.651 m)   Wt 152 lb 6.4 oz (69.1 kg)   SpO2 98%   BMI 25.36 kg/m      ASSESSMENT/PLAN:   Diabetes type 2, nonobese  See history of present illness for  discussion of  current management, blood sugar patterns and problems identified  Her A1c is stable at 6.8  Blood sugars again appear to be lower than expected for A1c She is not taking blood sugars readings enough Also recently has been out of her Trulicity since she did not get a  refill for some reason However she has been more consistent with exercise over the last 3 months More recently cannot go to the gym and encouraged her to walk regularly at least 3 days a week  RENAL dysfunction: Etiology unclear and will continue to follow, may possibly need to reduce losartan and add another drug in combination No microalbuminuria   Hypertension: Well controlled, tends to have whitecoat syndrome  Follow-up in 3 months  There are no Patient Instructions on file for this visit.   AElayne Snare3/19/2020, 3:42 PM   Note: This office note was prepared with Dragon voice recognition system technology. Any transcriptional errors that result from this process are unintentional.

## 2018-09-02 ENCOUNTER — Inpatient Hospital Stay: Admission: RE | Admit: 2018-09-02 | Payer: BC Managed Care – PPO | Source: Ambulatory Visit

## 2018-09-27 ENCOUNTER — Other Ambulatory Visit: Payer: Self-pay | Admitting: Endocrinology

## 2018-10-21 ENCOUNTER — Other Ambulatory Visit: Payer: Self-pay | Admitting: Endocrinology

## 2018-11-28 ENCOUNTER — Other Ambulatory Visit: Payer: Self-pay

## 2018-11-28 ENCOUNTER — Other Ambulatory Visit (INDEPENDENT_AMBULATORY_CARE_PROVIDER_SITE_OTHER): Payer: BC Managed Care – PPO

## 2018-11-28 DIAGNOSIS — E1165 Type 2 diabetes mellitus with hyperglycemia: Secondary | ICD-10-CM | POA: Diagnosis not present

## 2018-11-28 LAB — LIPID PANEL
Cholesterol: 143 mg/dL (ref 0–200)
HDL: 41 mg/dL (ref >39.00)
LDL Cholesterol: 80 mg/dL (ref 0–99)
NonHDL: 101.93
Total CHOL/HDL Ratio: 3
Triglycerides: 109 mg/dL (ref 0.0–149.0)
VLDL: 21.8 mg/dL (ref 0.0–40.0)

## 2018-11-28 LAB — COMPREHENSIVE METABOLIC PANEL
ALT: 12 U/L (ref 0–35)
AST: 16 U/L (ref 0–37)
Albumin: 4.4 g/dL (ref 3.5–5.2)
Alkaline Phosphatase: 81 U/L (ref 39–117)
BUN: 17 mg/dL (ref 6–23)
CO2: 28 mEq/L (ref 19–32)
Calcium: 10.1 mg/dL (ref 8.4–10.5)
Chloride: 103 mEq/L (ref 96–112)
Creatinine, Ser: 1.09 mg/dL (ref 0.40–1.20)
GFR: 62.2 mL/min (ref >60.00)
Glucose, Bld: 112 mg/dL — ABNORMAL HIGH (ref 70–99)
Potassium: 4.6 mEq/L (ref 3.5–5.1)
Sodium: 139 mEq/L (ref 135–145)
Total Bilirubin: 0.5 mg/dL (ref 0.2–1.2)
Total Protein: 8.2 g/dL (ref 6.0–8.3)

## 2018-11-28 LAB — HEMOGLOBIN A1C: Hgb A1c MFr Bld: 7.3 % — ABNORMAL HIGH (ref 4.6–6.5)

## 2018-12-01 ENCOUNTER — Other Ambulatory Visit: Payer: Self-pay

## 2018-12-01 ENCOUNTER — Telehealth: Payer: Self-pay | Admitting: Endocrinology

## 2018-12-01 ENCOUNTER — Ambulatory Visit (INDEPENDENT_AMBULATORY_CARE_PROVIDER_SITE_OTHER): Payer: BC Managed Care – PPO | Admitting: Endocrinology

## 2018-12-01 ENCOUNTER — Encounter: Payer: Self-pay | Admitting: Endocrinology

## 2018-12-01 DIAGNOSIS — E1165 Type 2 diabetes mellitus with hyperglycemia: Secondary | ICD-10-CM

## 2018-12-01 DIAGNOSIS — E7800 Pure hypercholesterolemia, unspecified: Secondary | ICD-10-CM

## 2018-12-01 NOTE — Telephone Encounter (Signed)
Called pt and completed pre-rooming process for todays MD to pt phone call visit.

## 2018-12-01 NOTE — Telephone Encounter (Signed)
Patient is returning call in regards to phone call visit this afternoon.

## 2018-12-01 NOTE — Progress Notes (Signed)
Patient ID: Sherry Hart, female   DOB: 04-20-1961, 58 y.o.   MRN: [id number]   Reason for Appointment : Followup of diabetes   Today's office visit was provided via telemedicine using a telephone call to the patient Patient has been explained the limitations of evaluation and management by telemedicine and the availability of in person appointments.  The patient understood the limitations and agreed to proceed. Patient also understood that the telehealth visit is billable. . Location of the patient: Home . Location of the provider: Office Only the patient and myself were participating in the encounter  History of Present Illness          Type 2 diabetes mellitus, date of diagnosis: 2002       Past history: She had been treated with metformin since diagnosis and previous records are not available for review. She thinks that when she was taking her blood sugar regularly there were usually below 150 in the morning In 2012 because of lack of insurance she did not take her medications or check her sugar for about a year In 6/13 she was evaluated in the emergency room for abdominal pain and was found to have an A1c of 14% with very high blood sugars. Her only symptoms were weight loss, treated with insulin for short-term. She was seen for initial consultation in 1/15 and at that time her glucose was 431 with A1c of 13.1 Initially treated with insulin but this was later replaced with oral agents She started taking Kombiglyze XR on 08/08/13 and had gone up to 2 tablets daily Because of tendency to relatively higher fasting readings she was given low-dose Amaryl in 3/15 in addition  She has been on SGLT2 drugs, Invokana or Jardiance since 10/2014  She has been on Trulicity 5.42 mg weekly since 12/05/16 when her A1c was 8.3  Recent history:   Non-insulin hypoglycemic drugs: Trulicity 7.06 mg weekly, Glimeperide 0.5 mg acs, Janumet XR 100/1000 daily, Jardiance 12.5 mg at dinner  A1c  is higher at 7.3 Lowest reading previously 6.6  Current blood sugars at home and management:  Patient is not clear why her blood sugars are high  She said that she is eating mostly 1 meal a day  However as doing blood sugar testing irregularly and did not have any consistent blood sugar readings to review from a monitor which does not have a functioning battery currently  Highest blood sugar was 233 today after eating a store-bought pizza  Otherwise blood sugar range recently has been between 86 and 140 but not always checking after meals  She has not been able to exercise as much regularly except for last week and also when she is not working  She has been consistent with her medications as above  Her weight is about the same reportedly    Side effects from medications have been:  GI side effects from 2000 mg metformin ER  Glucose monitoring:  done infrequently, readings as above     Glucometer:  One Touch     Blood Glucose readings average not available   Glycemic control:   Lab Results  Component Value Date   HGBA1C 7.3 (H) 11/28/2018   HGBA1C 6.8 (H) 08/29/2018   HGBA1C 6.8 (H) 05/23/2018   Lab Results  Component Value Date   MICROALBUR 1.3 02/21/2018   [place] 80 11/28/2018   CREATININE 1.09 11/28/2018   Self-care: The diet that the patient has been following is:  usually low fat, low  carbs, Variable.    Usually has fruit for snacks   Avoiding drinks with sugar, dinner is at 5-7 pm           Dietician visit: Most recent:  01/01/17              Compliance with the medical regimen: fair  Weight history: 125 upto 173, her lowest weight was in 11/2011  Wt Readings from Last 3 Encounters:  09/01/18 152 lb 6.4 oz (69.1 kg)  05/26/18 153 lb 9.6 oz (69.7 kg)  03/28/18 150 lb (68 kg)   Lab on 11/28/2018  Component Date Value Ref Range Status  . Cholesterol 11/28/2018 143  0 - 200 mg/dL Final   ATP III Classification       Desirable:  < 200 mg/dL                Borderline High:  200 - 239 mg/dL          High:  > = 240 mg/dL  . Triglycerides 11/28/2018 109.0  0.0 - 149.0 mg/dL Final   Normal:  <150 mg/dLBorderline High:  150 - 199 mg/dL  . HDL 11/28/2018 41.00  >39.00 mg/dL Final  . VLDL 11/28/2018 21.8  0.0 - 40.0 mg/dL Final  . LDL Cholesterol 11/28/2018 80  0 - 99 mg/dL Final  . Total CHOL/HDL Ratio 11/28/2018 3   Final                  Men          Women1/2 Average Risk     3.4          3.3Average Risk          5.0          4.42X Average Risk          9.6          7.13X Average Risk          15.0          11.0                      . NonHDL 11/28/2018 101.93   Final   NOTE:  Non-HDL goal should be 30 mg/dL higher than patient's LDL goal (i.e. LDL goal of < 70 mg/dL, would have non-HDL goal of < 100 mg/dL)  . Sodium 11/28/2018 139  135 - 145 mEq/L Final  . Potassium 11/28/2018 4.6  3.5 - 5.1 mEq/L Final  . Chloride 11/28/2018 103  96 - 112 mEq/L Final  . CO2 11/28/2018 28  19 - 32 mEq/L Final  . Glucose, Bld 11/28/2018 112* 70 - 99 mg/dL Final  . BUN 11/28/2018 17  6 - 23 mg/dL Final  . Creatinine, Ser 11/28/2018 1.09  0.40 - 1.20 mg/dL Final  . Total Bilirubin 11/28/2018 0.5  0.2 - 1.2 mg/dL Final  . Alkaline Phosphatase 11/28/2018 81  39 - 117 U/L Final  . AST 11/28/2018 16  0 - 37 U/L Final  . ALT 11/28/2018 12  0 - 35 U/L Final  . Total Protein 11/28/2018 8.2  6.0 - 8.3 g/dL Final  . Albumin 11/28/2018 4.4  3.5 - 5.2 g/dL Final  . Calcium 11/28/2018 10.1  8.4 - 10.5 mg/dL Final  . GFR 11/28/2018 62.20  >60.00 mL/min Final  . Hgb A1c MFr Bld 11/28/2018 7.3* 4.6 - 6.5 % Final   Glycemic Control Guidelines for People with Diabetes:Non Diabetic:  <6%Goal of Therapy: <  7%Additional Action Suggested:  >8%    Other active problems: See review of systems     Allergies as of 12/01/2018      Reactions   Penicillins Hives   Back of neck and upper back.      Medication List       Accurate as of December 01, 2018  4:00 PM. If you have any  questions, ask your nurse or doctor.        aspirin 81 MG tablet Take 81 mg by mouth daily.   atorvastatin 20 MG tablet Commonly known as: LIPITOR TAKE 1 TABLET BY MOUTH EVERY DAY   Dulaglutide 0.75 MG/0.5ML Sopn Commonly known as: Advertising account planner IN THE ABDOMINAL SKIN AS DIRECTED ONCE A WEEK   ferrous sulfate 325 (65 FE) MG EC tablet Take 325 mg by mouth 3 (three) times daily with meals. Take 1 tablet by mouth daily.   Janumet XR [phone] MG Tb24 Generic drug: SitaGLIPtin-MetFORMIN HCl TAKE 1 TABLET BY MOUTH EVERY DAY   Jardiance 25 MG Tabs tablet Generic drug: empagliflozin TAKE 1 TABLET BY MOUTH EVERY DAY What changed:   how much to take  how to take this  when to take this   losartan 100 MG tablet Commonly known as: COZAAR TAKE 1 TABLET BY MOUTH DAILY   ONE TOUCH ULTRA 2 w/Device Kit Use to check blood sugar 3 times per day dx code E11.65   onetouch ultrasoft lancets Use as instructed   vitamin B-12 500 MCG tablet Commonly known as: CYANOCOBALAMIN Take 1,000 mcg by mouth daily. Take 1 tablet by mouth daily.       Allergies:  Allergies  Allergen Reactions  . Penicillins Hives    Back of neck and upper back.    Past Medical History:  Diagnosis Date  . Abdominal pain   . Anemia   . B12 deficiency   . Biliary colic   . Constipation    occasionally not chronic per pt. -no medicines for this   . Diabetes mellitus 12 yrs ago   . Hyperlipidemia   . Hypertension   . Pernicious anemia   . Unexplained weight loss   . Vomiting   . Weakness     Past Surgical History:  Procedure Laterality Date  . CESAREAN SECTION  11/29/1990, 04/06/1994  . COLONOSCOPY  09/01/2004   all normal - in epic  . KNEE SURGERY  11/2000   right  . MOUTH SURGERY  04/04/1987  . UPPER GASTROINTESTINAL ENDOSCOPY  09-01-04   gastric polyp- m.johnson- in epic  . WISDOM TOOTH EXTRACTION      Family History  Problem Relation Age of Onset  . Cancer Mother        breast  .  Breast cancer Mother 65  . Diabetes Father   . Thyroid disease Sister   . Hypertension Neg Hx   . Colon cancer Neg Hx   . Colon polyps Neg Hx   . Rectal cancer Neg Hx   . Stomach cancer Neg Hx     Social History:  reports that she has never smoked. She has never used smokeless tobacco. She reports that she does not drink alcohol or use drugs.    Review of Systems     HYPERTENSION: blood pressure has been treated with Cozaar 142m   Her blood pressure tends to be higher in the office at times   She has checked readings at home recently Her blood pressure recently was 123/73 and she has not  had any high readings, not clear how often she monitors   BP Readings from Last 3 Encounters:  09/01/18 (!) 142/82  05/26/18 132/70  03/28/18 124/62   RENAL function: This is been variable and now creatinine is back down  Previously a high creatinine level had improved with cutting Jardiance in half   Lab Results  Component Value Date   CREATININE 1.09 11/28/2018   CREATININE 1.29 (H) 08/29/2018   CREATININE 1.14 05/23/2018     Lipids:   She has been taking Lipitor from PCP LDL is controlled with the 20 mg dosage    Lab Results  Component Value Date   CHOL 143 11/28/2018   HDL 41.00 11/28/2018   LDLCALC 80 11/28/2018   LDLDIRECT 175.6 07/28/2013   TRIG 109.0 11/28/2018   CHOLHDL 3 11/28/2018     Physical Examination:  There were no vitals taken for this visit.     ASSESSMENT/PLAN:   Diabetes type 2, nonobese  See history of present illness for  discussion of  current management, blood sugar patterns and problems identified  Her A1c is 7.3, previously 6.8  Not clear why her blood sugars are higher but may be related to stress, staying at home more consistently because of the pandemic and eating certain kinds of foods that raise her blood sugar like pizza  Today discussed the need to start monitoring blood sugars by rotation after different meals to help  identify what makes her sugar go up She is not eating large portions or snacks Also fasting readings are not high except once  She can also benefit from more regular walking for exercise until her gym reopens  RENAL dysfunction: Relatively better and not clear why her creatinine shows fluctuation   Hypertension: Well controlled, monitored at home  Follow-up in 3 months  There are no Patient Instructions on file for this visit.   Elayne Snare 12/01/2018, 4:00 PM   Note: This office note was prepared with Dragon voice recognition system technology. Any transcriptional errors that result from this process are unintentional.

## 2018-12-28 ENCOUNTER — Other Ambulatory Visit: Payer: Self-pay | Admitting: Endocrinology

## 2019-01-02 ENCOUNTER — Other Ambulatory Visit: Payer: Self-pay

## 2019-01-02 DIAGNOSIS — E1165 Type 2 diabetes mellitus with hyperglycemia: Secondary | ICD-10-CM

## 2019-01-02 MED ORDER — ONETOUCH ULTRASOFT LANCETS MISC: 12 refills | Status: DC

## 2019-01-05 ENCOUNTER — Ambulatory Visit: Payer: BC Managed Care – PPO

## 2019-01-16 ENCOUNTER — Other Ambulatory Visit: Payer: Self-pay | Admitting: Endocrinology

## 2019-01-18 ENCOUNTER — Other Ambulatory Visit: Payer: Self-pay | Admitting: Endocrinology

## 2019-01-20 ENCOUNTER — Other Ambulatory Visit: Payer: Self-pay | Admitting: Endocrinology

## 2019-03-10 ENCOUNTER — Other Ambulatory Visit (INDEPENDENT_AMBULATORY_CARE_PROVIDER_SITE_OTHER): Payer: BC Managed Care – PPO

## 2019-03-10 ENCOUNTER — Other Ambulatory Visit: Payer: Self-pay

## 2019-03-10 DIAGNOSIS — E1165 Type 2 diabetes mellitus with hyperglycemia: Secondary | ICD-10-CM

## 2019-03-10 LAB — COMPREHENSIVE METABOLIC PANEL
ALT: 18 U/L (ref 0–35)
AST: 25 U/L (ref 0–37)
Albumin: 4.6 g/dL (ref 3.5–5.2)
Alkaline Phosphatase: 78 U/L (ref 39–117)
BUN: 20 mg/dL (ref 6–23)
CO2: 27 mEq/L (ref 19–32)
Calcium: 10.5 mg/dL (ref 8.4–10.5)
Chloride: 102 mEq/L (ref 96–112)
Creatinine, Ser: 1.2 mg/dL (ref 0.40–1.20)
GFR: 55.62 mL/min — ABNORMAL LOW (ref >60.00)
Glucose, Bld: 100 mg/dL — ABNORMAL HIGH (ref 70–99)
Potassium: 5.1 mEq/L (ref 3.5–5.1)
Sodium: 139 mEq/L (ref 135–145)
Total Bilirubin: 0.6 mg/dL (ref 0.2–1.2)
Total Protein: 8.6 g/dL — ABNORMAL HIGH (ref 6.0–8.3)

## 2019-03-10 LAB — MICROALBUMIN / CREATININE URINE RATIO
Creatinine,U: 82.9 mg/dL
Microalb Creat Ratio: 1.2 mg/g (ref 0.0–30.0)
Microalb, Ur: 1 mg/dL (ref 0.0–1.9)

## 2019-03-10 LAB — HEMOGLOBIN A1C: Hgb A1c MFr Bld: 7.1 % — ABNORMAL HIGH (ref 4.6–6.5)

## 2019-03-14 ENCOUNTER — Encounter: Payer: Self-pay | Admitting: Endocrinology

## 2019-03-14 ENCOUNTER — Other Ambulatory Visit: Payer: Self-pay

## 2019-03-14 ENCOUNTER — Ambulatory Visit: Payer: BC Managed Care – PPO | Admitting: Endocrinology

## 2019-03-14 VITALS — BP 142/80 | HR 102 | Temp 98.8°F | Wt 151.2 lb

## 2019-03-14 DIAGNOSIS — Z23 Encounter for immunization: Secondary | ICD-10-CM

## 2019-03-14 DIAGNOSIS — E7800 Pure hypercholesterolemia, unspecified: Secondary | ICD-10-CM

## 2019-03-14 DIAGNOSIS — E1165 Type 2 diabetes mellitus with hyperglycemia: Secondary | ICD-10-CM | POA: Diagnosis not present

## 2019-03-14 NOTE — Patient Instructions (Signed)
Check blood sugars on waking up 3 days a week  Also check blood sugars about 2 hours after meals and do this after different meals by rotation  Recommended blood sugar levels on waking up are 90-130 and about 2 hours after meal is 130-160  Please bring your blood sugar monitor to each visit, thank you   

## 2019-03-14 NOTE — Progress Notes (Signed)
Patient ID: Sherry Hart, female   DOB: July 03, 1960, 58 y.o.   MRN: [id number]   Reason for Appointment : Followup of diabetes    History of Present Illness          Type 2 diabetes mellitus, date of diagnosis: 2002       Past history: She had been treated with metformin since diagnosis and previous records are not available for review. She thinks that when she was taking her blood sugar regularly there were usually below 150 in the morning In 2012 because of lack of insurance she did not take her medications or check her sugar for about a year In 6/13 she was evaluated in the emergency room for abdominal pain and was found to have an A1c of 14% with very high blood sugars. Her only symptoms were weight loss, treated with insulin for short-term. She was seen for initial consultation in 1/15 and at that time her glucose was 431 with A1c of 13.1 Initially treated with insulin but this was later replaced with oral agents She started taking Kombiglyze XR on 08/08/13 and had gone up to 2 tablets daily Because of tendency to relatively higher fasting readings she was given low-dose Amaryl in 3/15 in addition  She has been on SGLT2 drugs, Invokana or Jardiance since 10/2014  She has been on Trulicity 5.80 mg weekly since 12/05/16 when her A1c was 8.3  Recent history:   Non-insulin hypoglycemic drugs: Trulicity 9.98 mg weekly,Janumet XR 100/1000 daily, Jardiance 12.5 mg at dinner  A1c is slightly better at 7.1  Lowest reading previously 6.6  Current blood sugars at home and management:  As before she monitors her blood sugars infrequently and difficult to pinpoint where her blood sugars are high  In June she has been not been consistent with her diet with sometimes eating higher fat foods as well as not exercising  She is started walking  Overall she thinks she is trying to eat healthy.  Not clear if her readings are all before meals are somewhat after meals especially in  the evenings  No side effects with Jardiance 12.5 mg and her creatinine is still in the normal range, may have been higher with 25 mg Jardiance  Weight is about the same    Side effects from medications have been:  GI side effects from 2000 mg metformin ER  Glucose monitoring:  As below    Glucometer:  One Touch     Blood Glucose readings     PRE-MEAL Fasting Lunch Dinner Bedtime Overall  Glucose range:  102-118   102, 108    Mean/median:      109   POST-MEAL PC Breakfast PC Lunch PC Dinner  Glucose range:    105, 126  Mean/median:       Glycemic control:   Lab Results  Component Value Date   HGBA1C 7.1 (H) 03/10/2019   HGBA1C 7.3 (H) 11/28/2018   HGBA1C 6.8 (H) 08/29/2018   Lab Results  Component Value Date   MICROALBUR 1.0 03/10/2019   LDLCALC 80 11/28/2018   CREATININE 1.20 03/10/2019   Self-care: The diet that the patient has been following is:  usually low fat, low carbs, Variable.    Usually has fruit for snacks   Avoiding drinks with sugar, dinner is at 5-7 pm           Dietician visit: Most recent:  01/01/17  Compliance with the medical regimen: fair  Weight history: 125 upto 173, her lowest weight was in 11/2011  Wt Readings from Last 3 Encounters:  03/14/19 151 lb 3.2 oz (68.6 kg)  09/01/18 152 lb 6.4 oz (69.1 kg)  05/26/18 153 lb 9.6 oz (69.7 kg)   Lab on 03/10/2019  Component Date Value Ref Range Status   Microalb, Ur 03/10/2019 1.0  0.0 - 1.9 mg/dL Final   Creatinine,U 03/10/2019 82.9  mg/dL Final   Microalb Creat Ratio 03/10/2019 1.2  0.0 - 30.0 mg/g Final   Sodium 03/10/2019 139  135 - 145 mEq/L Final   Potassium 03/10/2019 5.1  3.5 - 5.1 mEq/L Final   Chloride 03/10/2019 102  96 - 112 mEq/L Final   CO2 03/10/2019 27  19 - 32 mEq/L Final   Glucose, Bld 03/10/2019 100* 70 - 99 mg/dL Final   BUN 03/10/2019 20  6 - 23 mg/dL Final   Creatinine, Ser 03/10/2019 1.20  0.40 - 1.20 mg/dL Final   Total Bilirubin 03/10/2019 0.6   0.2 - 1.2 mg/dL Final   Alkaline Phosphatase 03/10/2019 78  39 - 117 U/L Final   AST 03/10/2019 25  0 - 37 U/L Final   ALT 03/10/2019 18  0 - 35 U/L Final   Total Protein 03/10/2019 8.6* 6.0 - 8.3 g/dL Final   Albumin 03/10/2019 4.6  3.5 - 5.2 g/dL Final   Calcium 03/10/2019 10.5  8.4 - 10.5 mg/dL Final   GFR 03/10/2019 55.62* >60.00 mL/min Final   Hgb A1c MFr Bld 03/10/2019 7.1* 4.6 - 6.5 % Final   Glycemic Control Guidelines for People with Diabetes:Non Diabetic:  <6%Goal of Therapy: <7%Additional Action Suggested:  >8%    Other active problems: See review of systems     Allergies as of 03/14/2019      Reactions   Penicillins Hives   Back of neck and upper back.      Medication List       Accurate as of March 14, 2019  8:48 PM. If you have any questions, ask your nurse or doctor.        aspirin 81 MG tablet Take 81 mg by mouth daily.   atorvastatin 20 MG tablet Commonly known as: LIPITOR TAKE 1 TABLET BY MOUTH EVERY DAY   ferrous sulfate 325 (65 FE) MG EC tablet Take 325 mg by mouth 3 (three) times daily with meals. Take 1 tablet by mouth daily.   Janumet XR [phone] MG Tb24 Generic drug: SitaGLIPtin-MetFORMIN HCl TAKE 1 TABLET BY MOUTH EVERY DAY   Jardiance 25 MG Tabs tablet Generic drug: empagliflozin TAKE 1 TABLET BY MOUTH EVERY DAY   losartan 100 MG tablet Commonly known as: COZAAR TAKE 1 TABLET BY MOUTH EVERY DAY   ONE TOUCH ULTRA 2 w/Device Kit Use to check blood sugar 3 times per day dx code E11.65   OneTouch Ultra test strip Generic drug: glucose blood USE AS DIRECTED   onetouch ultrasoft lancets Use as instructed   Trulicity 0.94 MH/6.8GS Sopn Generic drug: Dulaglutide INJECT IN THE ABDOMINAL SKIN AS DIRECTED ONCE A WEEK   vitamin B-12 500 MCG tablet Commonly known as: CYANOCOBALAMIN Take 1,000 mcg by mouth daily. Take 1 tablet by mouth daily.       Allergies:  Allergies  Allergen Reactions   Penicillins Hives    Back  of neck and upper back.    Past Medical History:  Diagnosis Date   Abdominal pain    Anemia  B12 deficiency    Biliary colic    Constipation    occasionally not chronic per pt. -no medicines for this    Diabetes mellitus 12 yrs ago    Hyperlipidemia    Hypertension    Pernicious anemia    Unexplained weight loss    Vomiting    Weakness     Past Surgical History:  Procedure Laterality Date   CESAREAN SECTION  11/29/1990, 04/06/1994   COLONOSCOPY  09/01/2004   all normal - in epic   KNEE SURGERY  11/2000   right   MOUTH SURGERY  04/04/1987   UPPER GASTROINTESTINAL ENDOSCOPY  09-01-04   gastric polyp- m.johnson- in epic   WISDOM TOOTH EXTRACTION      Family History  Problem Relation Age of Onset   Cancer Mother        breast   Breast cancer Mother 77   Diabetes Father    Thyroid disease Sister    Hypertension Neg Hx    Colon cancer Neg Hx    Colon polyps Neg Hx    Rectal cancer Neg Hx    Stomach cancer Neg Hx     Social History:  reports that she has never smoked. She has never used smokeless tobacco. She reports that she does not drink alcohol or use drugs.    Review of Systems     HYPERTENSION: blood pressure has been treated with Cozaar 182m   Her blood pressure tends to be higher in the office at times   She has checked readings at home regularly Her blood pressure [id number] She has not brought her home monitor for comparison As before blood pressure may be slightly higher in the office than at home   BP Readings from Last 3 Encounters:  03/14/19 (!) 142/80  09/01/18 (!) 142/82  05/26/18 132/70   RENAL function: This is been variable  Previously a high creatinine level had improved with cutting Jardiance in half   Lab Results  Component Value Date   CREATININE 1.20 03/10/2019   CREATININE 1.09 11/28/2018   CREATININE 1.29 (H) 08/29/2018   Also has borderline potassium  Lab Results  Component Value Date     K 5.1 03/10/2019     Lipids:   She has been taking Lipitor from PCP LDL is controlled with the 20 mg dosage    Lab Results  Component Value Date   CHOL 143 11/28/2018   HDL 41.00 11/28/2018   LDLCALC 80 11/28/2018   LDLDIRECT 175.6 07/28/2013   TRIG 109.0 11/28/2018   CHOLHDL 3 11/28/2018     Physical Examination:  BP (!) 142/80    Pulse (!) 102    Temp 98.8 F (37.1 C)    Wt 151 lb 3.2 oz (68.6 kg)    SpO2 96%    BMI 25.16 kg/m      ASSESSMENT/PLAN:   Diabetes type 2, nonobese  See history of present illness for  discussion of  current management, blood sugar patterns and problems identified  Her A1c is 7.1, previously 6.8  A1c is slightly better with consistently improved regimen with walking for exercise and generally better diet She has been asked to cut back on high fat foods  She can continue the same regimen including low-dose Trulicity She thinks he can exercise more now with being able to go to the gym  RENAL dysfunction: Relatively better and not clear why her creatinine shows fluctuation 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 No microalbuminuria  Hypertension:  Relatively well controlled, blood pressures seem lower at home but need to compare her home monitor to the office instrument for reliability  Follow-up in 4 months  Influenza vaccine given  Patient Instructions  Check blood sugars on waking up 3 days a week  Also check blood sugars about 2 hours after meals and do this after different meals by rotation  Recommended blood sugar levels on waking up are 90-130 and about 2 hours after meal is 130-160  Please bring your blood sugar monitor to each visit, thank you       Elayne Snare 03/14/2019, 8:48 PM   Note: This office note was prepared with Dragon voice recognition system technology. Any transcriptional errors that result from this process are unintentional.

## 2019-04-20 ENCOUNTER — Other Ambulatory Visit: Payer: Self-pay | Admitting: Endocrinology

## 2019-04-27 ENCOUNTER — Ambulatory Visit: Payer: BC Managed Care – PPO

## 2019-04-27 ENCOUNTER — Ambulatory Visit
Admission: RE | Admit: 2019-04-27 | Discharge: 2019-04-27 | Disposition: A | Discharge status: Home / Self Care | Payer: BC Managed Care – PPO | Source: Ambulatory Visit | Attending: Family Medicine | Admitting: Family Medicine

## 2019-04-27 ENCOUNTER — Other Ambulatory Visit: Payer: Self-pay

## 2019-04-27 DIAGNOSIS — Z1231 Encounter for screening mammogram for malignant neoplasm of breast: Secondary | ICD-10-CM

## 2019-05-30 ENCOUNTER — Other Ambulatory Visit: Payer: Self-pay | Admitting: Endocrinology

## 2019-06-27 ENCOUNTER — Other Ambulatory Visit: Payer: Self-pay | Admitting: Endocrinology

## 2019-07-13 ENCOUNTER — Other Ambulatory Visit: Payer: BC Managed Care – PPO

## 2019-07-14 ENCOUNTER — Other Ambulatory Visit: Payer: Self-pay

## 2019-07-14 ENCOUNTER — Other Ambulatory Visit: Payer: BC Managed Care – PPO

## 2019-07-14 DIAGNOSIS — E7800 Pure hypercholesterolemia, unspecified: Secondary | ICD-10-CM

## 2019-07-14 DIAGNOSIS — E1165 Type 2 diabetes mellitus with hyperglycemia: Secondary | ICD-10-CM | POA: Diagnosis not present

## 2019-07-14 LAB — COMPREHENSIVE METABOLIC PANEL
ALT: 19 U/L (ref 0–35)
AST: 23 U/L (ref 0–37)
Albumin: 4.6 g/dL (ref 3.5–5.2)
Alkaline Phosphatase: 76 U/L (ref 39–117)
BUN: 10 mg/dL (ref 6–23)
CO2: 30 mEq/L (ref 19–32)
Calcium: 10.1 mg/dL (ref 8.4–10.5)
Chloride: 102 mEq/L (ref 96–112)
Creatinine, Ser: 0.99 mg/dL (ref 0.40–1.20)
GFR: 69.36 mL/min (ref >60.00)
Glucose, Bld: 113 mg/dL — ABNORMAL HIGH (ref 70–99)
Potassium: 4.8 mEq/L (ref 3.5–5.1)
Sodium: 138 mEq/L (ref 135–145)
Total Bilirubin: 0.6 mg/dL (ref 0.2–1.2)
Total Protein: 8.7 g/dL — ABNORMAL HIGH (ref 6.0–8.3)

## 2019-07-14 LAB — LIPID PANEL
Cholesterol: 143 mg/dL (ref 0–200)
HDL: 49.2 mg/dL (ref >39.00)
LDL Cholesterol: 82 mg/dL (ref 0–99)
NonHDL: 94.09
Total CHOL/HDL Ratio: 3
Triglycerides: 59 mg/dL (ref 0.0–149.0)
VLDL: 11.8 mg/dL (ref 0.0–40.0)

## 2019-07-14 LAB — HEMOGLOBIN A1C: Hgb A1c MFr Bld: 6.6 % — ABNORMAL HIGH (ref 4.6–6.5)

## 2019-07-18 ENCOUNTER — Encounter: Payer: Self-pay | Admitting: Endocrinology

## 2019-07-18 ENCOUNTER — Ambulatory Visit (INDEPENDENT_AMBULATORY_CARE_PROVIDER_SITE_OTHER): Payer: BC Managed Care – PPO | Admitting: Endocrinology

## 2019-07-18 ENCOUNTER — Other Ambulatory Visit: Payer: Self-pay

## 2019-07-18 DIAGNOSIS — E119 Type 2 diabetes mellitus without complications: Secondary | ICD-10-CM

## 2019-07-18 DIAGNOSIS — E7800 Pure hypercholesterolemia, unspecified: Secondary | ICD-10-CM | POA: Diagnosis not present

## 2019-07-18 DIAGNOSIS — I10 Essential (primary) hypertension: Secondary | ICD-10-CM

## 2019-07-18 NOTE — Progress Notes (Signed)
Patient ID: Sherry Hart, female   DOB: Mar 23, 1961, 59 y.o.   MRN: [id number]  Today's office visit was provided via telemedicine using a telephone call to the patient Patient has been explained the limitations of evaluation and management by telemedicine and the availability of in person appointments.  The patient understood the limitations and agreed to proceed. Patient also understood that the telehealth visit is billable. . Location of the patient: Home . Location of the provider: Office Only the patient and myself were participating in the encounter   Reason for Appointment : Followup of diabetes    History of Present Illness          Type 2 diabetes mellitus, date of diagnosis: 2002       Past history: She had been treated with metformin since diagnosis and previous records are not available for review. She thinks that when she was taking her blood sugar regularly there were usually below 150 in the morning In 2012 because of lack of insurance she did not take her medications or check her sugar for about a year In 6/13 she was evaluated in the emergency room for abdominal pain and was found to have an A1c of 14% with very high blood sugars. Her only symptoms were weight loss, treated with insulin for short-term. She was seen for initial consultation in 1/15 and at that time her glucose was 431 with A1c of 13.1 Initially treated with insulin but this was later replaced with oral agents She started taking Kombiglyze XR on 08/08/13 and had gone up to 2 tablets daily Because of tendency to relatively higher fasting readings she was given low-dose Amaryl in 3/15 in addition  She has been on SGLT2 drugs, Invokana or Jardiance since 10/2014  She has been on Trulicity 3.97 mg weekly since 12/05/16 when her A1c was 8.3  Recent history:   Non-insulin hypoglycemic drugs: Trulicity 6.73 mg weekly,Janumet XR 100/1000 daily, Jardiance 12.5 mg at dinner  A1c is slightly  better at 6.6, was 7.1  Current blood sugars at home and management:  She says she has really work on her diet and cutting back on high carbohydrate foods, sweets and eating more healthier meats as well as reducing portions  Her weight is down about 3 pounds  She has also periodically been doing some exercise bike or walking as much as possible  She is checking blood sugars mostly before breakfast and dinnertime and only occasionally after dinner  However blood sugars are mostly near normal, lab glucose late morning was 113  Renal function is normal with continuing Jardiance    Side effects from medications have been:  GI side effects from 2000 mg metformin ER  Glucose monitoring:  As below    Glucometer:  One Touch 2     Blood Glucose readings     PRE-MEAL Fasting Lunch Dinner Bedtime Overall  Glucose range: 65-130 68 83-107    Mean/median:        POST-MEAL PC Breakfast PC Lunch PC Dinner  Glucose range:   111  Mean/median:      Previous readings:  PRE-MEAL Fasting Lunch Dinner Bedtime Overall  Glucose range:  102-118   102, 108    Mean/median:      109   POST-MEAL PC Breakfast PC Lunch PC Dinner  Glucose range:    105, 126  Mean/median:       Glycemic control:   Lab Results  Component Value Date   HGBA1C 6.6 (  H) 07/14/2019   HGBA1C 7.1 (H) 03/10/2019   HGBA1C 7.3 (H) 11/28/2018   Lab Results  Component Value Date   MICROALBUR 1.0 03/10/2019   LDLCALC 82 07/14/2019   CREATININE 0.99 07/14/2019   Self-care: The diet that the patient has been following is:  usually low fat, low carbs, Variable.    Usually has fruit for snacks   Avoiding drinks with sugar, dinner is at 5-7 pm           Dietician visit: Most recent:  01/01/17              Compliance with the medical regimen: fair  Weight history: 125 upto 173, her lowest weight was in 11/2011  Wt Readings from Last 3 Encounters:  03/14/19 151 lb 3.2 oz (68.6 kg)  09/01/18 152 lb 6.4 oz (69.1 kg)   05/26/18 153 lb 9.6 oz (69.7 kg)   Lab on 07/14/2019  Component Date Value Ref Range Status  . Cholesterol 07/14/2019 143  0 - 200 mg/dL Final   ATP III Classification       Desirable:  < 200 mg/dL               Borderline High:  200 - 239 mg/dL          High:  > = 240 mg/dL  . Triglycerides 07/14/2019 59.0  0.0 - 149.0 mg/dL Final   Normal:  <150 mg/dLBorderline High:  150 - 199 mg/dL  . HDL 07/14/2019 49.20  >39.00 mg/dL Final  . VLDL 07/14/2019 11.8  0.0 - 40.0 mg/dL Final  . LDL Cholesterol 07/14/2019 82  0 - 99 mg/dL Final  . Total CHOL/HDL Ratio 07/14/2019 3   Final                  Men          Women1/2 Average Risk     3.4          3.3Average Risk          5.0          4.42X Average Risk          9.6          7.13X Average Risk          15.0          11.0                      . NonHDL 07/14/2019 94.09   Final   NOTE:  Non-HDL goal should be 30 mg/dL higher than patient's LDL goal (i.e. LDL goal of < 70 mg/dL, would have non-HDL goal of < 100 mg/dL)  . Sodium 07/14/2019 138  135 - 145 mEq/L Final  . Potassium 07/14/2019 4.8  3.5 - 5.1 mEq/L Final  . Chloride 07/14/2019 102  96 - 112 mEq/L Final  . CO2 07/14/2019 30  19 - 32 mEq/L Final  . Glucose, Bld 07/14/2019 113* 70 - 99 mg/dL Final  . BUN 07/14/2019 10  6 - 23 mg/dL Final  . Creatinine, Ser 07/14/2019 0.99  0.40 - 1.20 mg/dL Final  . Total Bilirubin 07/14/2019 0.6  0.2 - 1.2 mg/dL Final  . Alkaline Phosphatase 07/14/2019 76  39 - 117 U/L Final  . AST 07/14/2019 23  0 - 37 U/L Final  . ALT 07/14/2019 19  0 - 35 U/L Final  . Total Protein 07/14/2019 8.7* 6.0 - 8.3 g/dL Final  . Albumin  07/14/2019 4.6  3.5 - 5.2 g/dL Final  . GFR 07/14/2019 69.36  >60.00 mL/min Final  . Calcium 07/14/2019 10.1  8.4 - 10.5 mg/dL Final  . Hgb A1c MFr Bld 07/14/2019 6.6* 4.6 - 6.5 % Final   Glycemic Control Guidelines for People with Diabetes:Non Diabetic:  <6%Goal of Therapy: <7%Additional Action Suggested:  >8%    Other active problems: See  review of systems     Allergies as of 07/18/2019      Reactions   Penicillins Hives   Back of neck and upper back.      Medication List       Accurate as of July 18, 2019  3:03 PM. If you have any questions, ask your nurse or doctor.        aspirin 81 MG tablet Take 81 mg by mouth daily.   atorvastatin 20 MG tablet Commonly known as: LIPITOR TAKE 1 TABLET BY MOUTH EVERY DAY   ferrous sulfate 325 (65 FE) MG EC tablet Take 325 mg by mouth 3 (three) times daily with meals. Take 1 tablet by mouth daily.   Janumet XR [phone] MG Tb24 Generic drug: SitaGLIPtin-MetFORMIN HCl TAKE 1 TABLET BY MOUTH EVERY DAY   Jardiance 25 MG Tabs tablet Generic drug: empagliflozin TAKE 1 TABLET BY MOUTH EVERY DAY   losartan 100 MG tablet Commonly known as: COZAAR TAKE 1 TABLET BY MOUTH EVERY DAY   ONE TOUCH ULTRA 2 w/Device Kit Use to check blood sugar 3 times per day dx code E11.65   OneTouch Ultra test strip Generic drug: glucose blood USE AS DIRECTED   onetouch ultrasoft lancets Use as instructed   Trulicity 9.62 EZ/6.6QH Sopn Generic drug: Dulaglutide INJECT IN THE ABDOMINAL SKIN AS DIRECTED ONCE A WEEK   vitamin B-12 500 MCG tablet Commonly known as: CYANOCOBALAMIN Take 1,000 mcg by mouth daily. Take 1 tablet by mouth daily.       Allergies:  Allergies  Allergen Reactions  . Penicillins Hives    Back of neck and upper back.    Past Medical History:  Diagnosis Date  . Abdominal pain   . Anemia   . B12 deficiency   . Biliary colic   . Constipation    occasionally not chronic per pt. -no medicines for this   . Diabetes mellitus 12 yrs ago   . Hyperlipidemia   . Hypertension   . Pernicious anemia   . Unexplained weight loss   . Vomiting   . Weakness     Past Surgical History:  Procedure Laterality Date  . CESAREAN SECTION  11/29/1990, 04/06/1994  . COLONOSCOPY  09/01/2004   all normal - in epic  . KNEE SURGERY  11/2000   right  . MOUTH SURGERY   04/04/1987  . UPPER GASTROINTESTINAL ENDOSCOPY  09-01-04   gastric polyp- m.johnson- in epic  . WISDOM TOOTH EXTRACTION      Family History  Problem Relation Age of Onset  . Cancer Mother        breast  . Breast cancer Mother 79  . Diabetes Father   . Thyroid disease Sister   . Hypertension Neg Hx   . Colon cancer Neg Hx   . Colon polyps Neg Hx   . Rectal cancer Neg Hx   . Stomach cancer Neg Hx     Social History:  reports that she has never smoked. She has never used smokeless tobacco. She reports that she does not drink alcohol or use drugs.  Review of Systems     HYPERTENSION: blood pressure has been treated with Cozaar 164m   Her blood pressure tends to be higher in the office at times   She has checked readings at home regularly Her blood pressure 121/71 She has not brought her home monitor for comparison As before blood pressure may be slightly higher in the office than at home   BP Readings from Last 3 Encounters:  03/14/19 (!) 142/80  09/01/18 (!) 142/82  05/26/18 132/70   RENAL function: This is been variable  Previously a high creatinine level had improved with cutting Jardiance in half   Lab Results  Component Value Date   CREATININE 0.99 07/14/2019   CREATININE 1.20 03/10/2019   CREATININE 1.09 11/28/2018   Also has borderline potassium  Lab Results  Component Value Date   K 4.8 07/14/2019     Lipids:   She has been taking Lipitor from PCP LDL is controlled with the 20 mg dosage    Lab Results  Component Value Date   CHOL 143 07/14/2019   HDL 49.20 07/14/2019   LDLCALC 82 07/14/2019   LDLDIRECT 175.6 07/28/2013   TRIG 59.0 07/14/2019   CHOLHDL 3 07/14/2019     Physical Examination:  There were no vitals taken for this visit.     ASSESSMENT/PLAN:   Diabetes type 2, nonobese  See history of present illness for  discussion of  current management, blood sugar patterns and problems identified  Her A1c is improved at 6.6   A1c is better with consistently improved meal planning She is also trying to exercise periodically although not as regularly this winter Blood sugars at home are excellent although can do better with postprandial monitoring and this was discussed  She can continue the same regimen including low-dose Trulicity She thinks he can exercise more now with being able to go to the gym  RENAL dysfunction: This is minimal and improved now, not clear why it fluctuates She says she is drinking plenty of fluids  Hypertension: well controlled, blood pressure has been checked only sporadically at home and advised her to do so once a week Also needs to bring in her home meter for comparison on next visit   Follow-up in 4 months    There are no Patient Instructions on file for this visit.  Duration of telephone encounter =11 minutes  AElayne Snare2/07/2019, 3:03 PM   Note: This office note was prepared with Dragon voice recognition system technology. Any transcriptional errors that result from this process are unintentional.

## 2019-07-23 ENCOUNTER — Other Ambulatory Visit: Payer: Self-pay | Admitting: Endocrinology

## 2019-09-12 ENCOUNTER — Ambulatory Visit: Payer: BC Managed Care – PPO | Admitting: Family Medicine

## 2019-09-25 ENCOUNTER — Other Ambulatory Visit: Payer: Self-pay | Admitting: Endocrinology

## 2019-10-12 ENCOUNTER — Other Ambulatory Visit: Payer: Self-pay | Admitting: Endocrinology

## 2019-10-22 ENCOUNTER — Other Ambulatory Visit: Payer: Self-pay | Admitting: Endocrinology

## 2019-11-14 ENCOUNTER — Other Ambulatory Visit: Payer: Self-pay

## 2019-11-14 ENCOUNTER — Other Ambulatory Visit (INDEPENDENT_AMBULATORY_CARE_PROVIDER_SITE_OTHER): Payer: BC Managed Care – PPO

## 2019-11-14 DIAGNOSIS — E119 Type 2 diabetes mellitus without complications: Secondary | ICD-10-CM | POA: Diagnosis not present

## 2019-11-14 LAB — HEMOGLOBIN A1C: Hgb A1c MFr Bld: 6.4 % (ref 4.6–6.5)

## 2019-11-14 LAB — BASIC METABOLIC PANEL
BUN: 17 mg/dL (ref 6–23)
CO2: 27 mEq/L (ref 19–32)
Calcium: 9.6 mg/dL (ref 8.4–10.5)
Chloride: 102 mEq/L (ref 96–112)
Creatinine, Ser: 1.02 mg/dL (ref 0.40–1.20)
GFR: 66.93 mL/min (ref >60.00)
Glucose, Bld: 77 mg/dL (ref 70–99)
Potassium: 4.3 mEq/L (ref 3.5–5.1)
Sodium: 137 mEq/L (ref 135–145)

## 2019-11-16 ENCOUNTER — Encounter: Payer: Self-pay | Admitting: Endocrinology

## 2019-11-16 ENCOUNTER — Telehealth (INDEPENDENT_AMBULATORY_CARE_PROVIDER_SITE_OTHER): Payer: BC Managed Care – PPO | Admitting: Endocrinology

## 2019-11-16 ENCOUNTER — Other Ambulatory Visit: Payer: Self-pay

## 2019-11-16 DIAGNOSIS — I10 Essential (primary) hypertension: Secondary | ICD-10-CM | POA: Diagnosis not present

## 2019-11-16 DIAGNOSIS — E119 Type 2 diabetes mellitus without complications: Secondary | ICD-10-CM

## 2019-11-16 DIAGNOSIS — E7800 Pure hypercholesterolemia, unspecified: Secondary | ICD-10-CM

## 2019-11-16 NOTE — Progress Notes (Signed)
Patient ID: Sherry Hart, female   DOB: 07-11-1960, 59 y.o.   MRN: [id number]  Today's office visit was provided via telemedicine using a telephone call to the patient Patient has been explained the limitations of evaluation and management by telemedicine and the availability of in person appointments.  The patient understood the limitations and agreed to proceed. Patient also understood that the telehealth visit is billable.  Location of the patient: Home  Location of the provider: Office Only the patient and myself were participating in the encounter   Reason for Appointment : Followup  History of Present Illness          Type 2 diabetes mellitus, date of diagnosis: 2002       Past history: She had been treated with metformin since diagnosis and previous records are not available for review. She thinks that when she was taking her blood sugar regularly there were usually below 150 in the morning In 2012 because of lack of insurance she did not take her medications or check her sugar for about a year In 6/13 she was evaluated in the emergency room for abdominal pain and was found to have an A1c of 14% with very high blood sugars. Her only symptoms were weight loss, treated with insulin for short-term. She was seen for initial consultation in 1/15 and at that time her glucose was 431 with A1c of 13.1 Initially treated with insulin but this was later replaced with oral agents She started taking Kombiglyze XR on 08/08/13 and had gone up to 2 tablets daily Because of tendency to relatively higher fasting readings she was given low-dose Amaryl in 3/15 in addition  She has been on SGLT2 drugs, Invokana or Jardiance since 10/2014  She has been on Trulicity 9.56 mg weekly since 12/05/16 when her A1c was 8.3  Recent history:   Non-insulin hypoglycemic drugs: Trulicity 3.87 mg weekly,Janumet XR 100/1000 daily, Jardiance 12.5 mg at dinner  A1c is slightly better at 6.4 and  about her best   Current blood sugars at home and management:  She continues to have excellent blood sugars at home  She has been trying to walk up to 2 miles several days a week  She is again keeping her portions and carbohydrates or fat intake relatively low  Generally trying to eat small portions of carbohydrates like bread, potatoes and pasta  Also has a small breakfast only  She has only a few readings recently for review since meter was not downloaded, lowest reading 75  Previously Amaryl has been stopped  Appears to have slightly higher readings fasting compared to after meals  She may have lost weight, now weighing 145 pounds  Renal function is normal with current dose of Jardiance    Side effects from medications have been:  GI side effects from 2000 mg metformin ER  Glucose monitoring:  As below    Glucometer:  One Touch 2     Blood Glucose readings from patient review of meter   PRE-MEAL Fasting Lunch Dinner Bedtime Overall  Glucose range:  121, 126   75  83   Mean/median:        POST-MEAL PC Breakfast PC Lunch PC Dinner  Glucose range: 86  125   Mean/median:      Previous readings:   PRE-MEAL Fasting Lunch Dinner Bedtime Overall  Glucose range: 65-130 68 83-107    Mean/median:        POST-MEAL PC Breakfast PC Lunch PC Dinner  Glucose range:   111  Mean/median:       Glycemic control:   Lab Results  Component Value Date   HGBA1C 6.4 11/14/2019   HGBA1C 6.6 (H) 07/14/2019   HGBA1C 7.1 (H) 03/10/2019   Lab Results  Component Value Date   MICROALBUR 1.0 03/10/2019   LDLCALC 82 07/14/2019   CREATININE 1.02 11/14/2019   Self-care: The diet that the patient has been following is:  usually low fat, low carbs, Variable.    Usually has fruit for snacks   Avoiding drinks with sugar, dinner is at 5-7 pm           Dietician visit: Most recent:  01/01/17              Compliance with the medical regimen: fair  Weight history: 125 upto 173, her  lowest weight was in 11/2011  Wt Readings from Last 3 Encounters:  03/14/19 151 lb 3.2 oz (68.6 kg)  09/01/18 152 lb 6.4 oz (69.1 kg)  05/26/18 153 lb 9.6 oz (69.7 kg)   Lab on 11/14/2019  Component Date Value Ref Range Status   Sodium 11/14/2019 137  135 - 145 mEq/L Final   Potassium 11/14/2019 4.3  3.5 - 5.1 mEq/L Final   Chloride 11/14/2019 102  96 - 112 mEq/L Final   CO2 11/14/2019 27  19 - 32 mEq/L Final   Glucose, Bld 11/14/2019 77  70 - 99 mg/dL Final   BUN 11/14/2019 17  6 - 23 mg/dL Final   Creatinine, Ser 11/14/2019 1.02  0.40 - 1.20 mg/dL Final   GFR 11/14/2019 66.93  >60.00 mL/min Final   Calcium 11/14/2019 9.6  8.4 - 10.5 mg/dL Final   Hgb A1c MFr Bld 11/14/2019 6.4  4.6 - 6.5 % Final   Glycemic Control Guidelines for People with Diabetes:Non Diabetic:  <6%Goal of Therapy: <7%Additional Action Suggested:  >8%    Other active problems: See review of systems     Allergies as of 11/16/2019      Reactions   Penicillins Hives   Back of neck and upper back.      Medication List       Accurate as of November 16, 2019  4:03 PM. If you have any questions, ask your nurse or doctor.        aspirin 81 MG tablet Take 81 mg by mouth daily.   atorvastatin 20 MG tablet Commonly known as: LIPITOR TAKE 1 TABLET BY MOUTH EVERY DAY   ferrous sulfate 325 (65 FE) MG EC tablet Take 325 mg by mouth 3 (three) times daily with meals. Take 1 tablet by mouth daily.   Janumet XR [phone] MG Tb24 Generic drug: SitaGLIPtin-MetFORMIN HCl TAKE 1 TABLET BY MOUTH EVERY DAY   Jardiance 25 MG Tabs tablet Generic drug: empagliflozin TAKE 1 TABLET BY MOUTH EVERY DAY   losartan 100 MG tablet Commonly known as: COZAAR TAKE 1 TABLET BY MOUTH EVERY DAY   ONE TOUCH ULTRA 2 w/Device Kit Use to check blood sugar 3 times per day dx code E11.65   OneTouch Ultra test strip Generic drug: glucose blood USE AS DIRECTED   onetouch ultrasoft lancets Use as instructed   Trulicity 6.38  GY/6.5LD Sopn Generic drug: Dulaglutide INJECT IN THE ABDOMINAL SKIN AS DIRECTED ONCE A WEEK   vitamin B-12 500 MCG tablet Commonly known as: CYANOCOBALAMIN Take 1,000 mcg by mouth daily. Take 1 tablet by mouth daily.       Allergies:  Allergies  Allergen  Reactions   Penicillins Hives    Back of neck and upper back.    Past Medical History:  Diagnosis Date   Abdominal pain    Anemia    B12 deficiency    Biliary colic    Constipation    occasionally not chronic per pt. -no medicines for this    Diabetes mellitus 12 yrs ago    Hyperlipidemia    Hypertension    Pernicious anemia    Unexplained weight loss    Vomiting    Weakness     Past Surgical History:  Procedure Laterality Date   CESAREAN SECTION  11/29/1990, 04/06/1994   COLONOSCOPY  09/01/2004   all normal - in epic   KNEE SURGERY  11/2000   right   MOUTH SURGERY  04/04/1987   UPPER GASTROINTESTINAL ENDOSCOPY  09-01-04   gastric polyp- m.johnson- in epic   WISDOM TOOTH EXTRACTION      Family History  Problem Relation Age of Onset   Cancer Mother        breast   Breast cancer Mother 55   Diabetes Father    Thyroid disease Sister    Hypertension Neg Hx    Colon cancer Neg Hx    Colon polyps Neg Hx    Rectal cancer Neg Hx    Stomach cancer Neg Hx     Social History:  reports that she has never smoked. She has never used smokeless tobacco. She reports that she does not drink alcohol or use drugs.    Review of Systems     HYPERTENSION: blood pressure has been treated with Cozaar '100mg'$    Her blood pressure tends to be higher in the office at times   She has checked readings at home regularly  Her blood pressure 122/74 recently She has not brought her home monitor for comparison No recent office readings available   BP Readings from Last 3 Encounters:  03/14/19 (!) 142/80  09/01/18 (!) 142/82  05/26/18 132/70   RENAL function: This is been recently very  stable  Previously the high creatinine level had improved with cutting Jardiance in half   Lab Results  Component Value Date   CREATININE 1.02 11/14/2019   CREATININE 0.99 07/14/2019   CREATININE 1.20 03/10/2019   Also has borderline potassium  Lab Results  Component Value Date   K 4.3 11/14/2019     Lipids:   She has been taking Lipitor from PCP LDL is controlled with the 20 mg dosage    Lab Results  Component Value Date   CHOL 143 07/14/2019   HDL 49.20 07/14/2019   LDLCALC 82 07/14/2019   LDLDIRECT 175.6 07/28/2013   TRIG 59.0 07/14/2019   CHOLHDL 3 07/14/2019     Physical Examination:  There were no vitals taken for this visit.     ASSESSMENT/PLAN:   Diabetes type 2, nonobese  See history of present illness for  discussion of  current management, blood sugar patterns and problems identified  Her A1c is improved at 6.4 compared to 6.6  A1c is excellent now and she is benefiting from consistent diet as discussed above Also walking regularly Blood sugars are excellent at home with highest reading 126 recently and only 77 in the lab Not on Amaryl currently and benefiting from her 3 drug treatment including 1.55 Trulicity  RENAL dysfunction: Improved with increased fluid intake  Hypertension: well controlled as measured by home meter  Lipids to be checked by PCP   Follow-up in  4 months    There are no Patient Instructions on file for this visit.  Duration of telephone encounter =6 minutes  Elayne Snare 11/16/2019, 4:03 PM   Note: This office note was prepared with Dragon voice recognition system technology. Any transcriptional errors that result from this process are unintentional.

## 2019-12-01 ENCOUNTER — Ambulatory Visit (INDEPENDENT_AMBULATORY_CARE_PROVIDER_SITE_OTHER): Payer: BC Managed Care – PPO | Admitting: Family Medicine

## 2019-12-01 ENCOUNTER — Other Ambulatory Visit: Payer: Self-pay

## 2019-12-01 VITALS — BP 135/81 | HR 92 | Temp 96.8°F | Wt 146.0 lb

## 2019-12-01 DIAGNOSIS — D649 Anemia, unspecified: Secondary | ICD-10-CM | POA: Diagnosis not present

## 2019-12-01 DIAGNOSIS — E782 Mixed hyperlipidemia: Secondary | ICD-10-CM

## 2019-12-01 DIAGNOSIS — E785 Hyperlipidemia, unspecified: Secondary | ICD-10-CM

## 2019-12-01 DIAGNOSIS — Z0000 Encounter for general adult medical examination without abnormal findings: Secondary | ICD-10-CM

## 2019-12-01 DIAGNOSIS — E1169 Type 2 diabetes mellitus with other specified complication: Secondary | ICD-10-CM

## 2019-12-01 DIAGNOSIS — Z0001 Encounter for general adult medical examination with abnormal findings: Secondary | ICD-10-CM

## 2019-12-01 DIAGNOSIS — I10 Essential (primary) hypertension: Secondary | ICD-10-CM | POA: Diagnosis not present

## 2019-12-01 DIAGNOSIS — Z124 Encounter for screening for malignant neoplasm of cervix: Secondary | ICD-10-CM

## 2019-12-01 NOTE — Progress Notes (Signed)
Subjective:    Patient ID: Sherry Hart, female    DOB: [birth year], 59 y.o.   MRN: [id number]  HPI Here for CPE.  Mammogram was performed 04/2019 and was normal.  Pap smear was performed in 2017.  This is due.  Colonoscopy was performed in 2017 and revealed one polyp.  She is seeing Dr. Dwyane Dee to manage her diabetes.  Most recent labs are listed below: Lab on 11/14/2019  Component Date Value Ref Range Status  . Sodium 11/14/2019 137  135 - 145 mEq/L Final  . Potassium 11/14/2019 4.3  3.5 - 5.1 mEq/L Final  . Chloride 11/14/2019 102  96 - 112 mEq/L Final  . CO2 11/14/2019 27  19 - 32 mEq/L Final  . Glucose, Bld 11/14/2019 77  70 - 99 mg/dL Final  . BUN 11/14/2019 17  6 - 23 mg/dL Final  . Creatinine, Ser 11/14/2019 1.02  0.40 - 1.20 mg/dL Final  . GFR 11/14/2019 66.93  >60.00 mL/min Final  . Calcium 11/14/2019 9.6  8.4 - 10.5 mg/dL Final  . Hgb A1c MFr Bld 11/14/2019 6.4  4.6 - 6.5 % Final   Glycemic Control Guidelines for People with Diabetes:Non Diabetic:  <6%Goal of Therapy: <7%Additional Action Suggested:  >8%      Immunization record is listed below: Immunization History  Administered Date(s) Administered  . Influenza,inj,Quad PF,6+ Mos 03/27/2015, 03/06/2016, 03/15/2017, 02/24/2018, 03/14/2019  . Influenza-Unspecified 04/12/2014  . PFIZER SARS-COV-2 Vaccination 09/07/2019  . Pneumococcal Polysaccharide-23 01/24/2018  . Tdap 08/13/2014   Past Medical History:  Diagnosis Date  . Abdominal pain   . Anemia   . B12 deficiency   . Biliary colic   . Constipation    occasionally not chronic per pt. -no medicines for this   . Diabetes mellitus 12 yrs ago   . Hyperlipidemia   . Hypertension   . Pernicious anemia   . Unexplained weight loss   . Vomiting   . Weakness    Past Surgical History:  Procedure Laterality Date  . CESAREAN SECTION  11/29/1990, 04/06/1994  . COLONOSCOPY  09/01/2004   all normal - in epic  . KNEE SURGERY  11/2000   right  . MOUTH SURGERY   04/04/1987  . UPPER GASTROINTESTINAL ENDOSCOPY  09-01-04   gastric polyp- m.johnson- in epic  . WISDOM TOOTH EXTRACTION     Current Outpatient Medications on File Prior to Visit  Medication Sig Dispense Refill  . aspirin 81 MG tablet Take 81 mg by mouth daily.    Marland Kitchen atorvastatin (LIPITOR) 20 MG tablet TAKE 1 TABLET BY MOUTH EVERY DAY 90 tablet 1  . Blood Glucose Monitoring Suppl (ONE TOUCH ULTRA 2) W/DEVICE KIT Use to check blood sugar 3 times per day dx code E11.65 1 each 0  . ferrous sulfate 325 (65 FE) MG EC tablet Take 325 mg by mouth daily. Take 1 tablet by mouth daily.     Marland Kitchen JANUMET XR [phone] MG TB24 TAKE 1 TABLET BY MOUTH EVERY DAY 30 tablet 3  . JARDIANCE 25 MG TABS tablet TAKE 1 TABLET BY MOUTH EVERY DAY (Patient taking differently: 12.5 mg. 1/2 tab by mouth daily.) 90 tablet 1  . Lancets (ONETOUCH ULTRASOFT) lancets Use as instructed 100 each 12  . losartan (COZAAR) 100 MG tablet TAKE 1 TABLET BY MOUTH EVERY DAY 90 tablet 1  . ONETOUCH ULTRA test strip USE AS DIRECTED 100 strip 12  . TRULICITY 1.56 FB/3.7HK SOPN INJECT IN THE ABDOMINAL SKIN AS DIRECTED ONCE  A WEEK 2 mL 2  . vitamin B-12 (CYANOCOBALAMIN) 500 MCG tablet Take 1,000 mcg by mouth daily. Take 1 tablet by mouth daily.     Current Facility-Administered Medications on File Prior to Visit  Medication Dose Route Frequency Provider Last Rate Last Admin  . cyanocobalamin ((VITAMIN B-12)) injection 1,000 mcg  1,000 mcg Subcutaneous Q30 days Susy Frizzle, MD   1,000 mcg at 05/03/17 1717   Allergies  Allergen Reactions  . Penicillins Hives    Back of neck and upper back.   Social History   Socioeconomic History  . Marital status: Married    Spouse name: Not on file  . Number of children: Not on file  . Years of education: Not on file  . Highest education level: Not on file  Occupational History  . Not on file  Tobacco Use  . Smoking status: Never Smoker  . Smokeless tobacco: Never Used  Substance and Sexual  Activity  . Alcohol use: No    Alcohol/week: 0.0 standard drinks  . Drug use: No  . Sexual activity: Not on file  Other Topics Concern  . Not on file  Social History Narrative  . Not on file   Social Determinants of Health   Financial Resource Strain:   . Difficulty of Paying Living Expenses:   Food Insecurity:   . Worried About Charity fundraiser in the Last Year:   . Arboriculturist in the Last Year:   Transportation Needs:   . Film/video editor (Medical):   Marland Kitchen Lack of Transportation (Non-Medical):   Physical Activity:   . Days of Exercise per Week:   . Minutes of Exercise per Session:   Stress:   . Feeling of Stress :   Social Connections:   . Frequency of Communication with Friends and Family:   . Frequency of Social Gatherings with Friends and Family:   . Attends Religious Services:   . Active Member of Clubs or Organizations:   . Attends Archivist Meetings:   Marland Kitchen Marital Status:   Intimate Partner Violence:   . Fear of Current or Ex-Partner:   . Emotionally Abused:   Marland Kitchen Physically Abused:   . Sexually Abused:    Family History  Problem Relation Age of Onset  . Cancer Mother        breast  . Breast cancer Mother 77  . Diabetes Father   . Thyroid disease Sister   . Hypertension Neg Hx   . Colon cancer Neg Hx   . Colon polyps Neg Hx   . Rectal cancer Neg Hx   . Stomach cancer Neg Hx       Review of Systems  All other systems reviewed and are negative.      Objective:   Physical Exam Vitals reviewed.  Constitutional:      Appearance: She is well-developed.  HENT:     Head: Normocephalic and atraumatic.     Right Ear: External ear normal.     Left Ear: External ear normal.     Nose: Nose normal.     Mouth/Throat:     Pharynx: No oropharyngeal exudate.  Eyes:     General: No scleral icterus.       Right eye: No discharge.        Left eye: No discharge.     Conjunctiva/sclera: Conjunctivae normal.     Pupils: Pupils are equal,  round, and reactive to light.  Neck:  Thyroid: No thyromegaly.     Vascular: No JVD.     Trachea: No tracheal deviation.  Cardiovascular:     Rate and Rhythm: Normal rate and regular rhythm.     Heart sounds: Normal heart sounds. No murmur heard.  No friction rub. No gallop.   Pulmonary:     Effort: Pulmonary effort is normal. No respiratory distress.     Breath sounds: Normal breath sounds. No stridor. No wheezing or rales.  Chest:     Chest wall: No tenderness.  Abdominal:     General: Bowel sounds are normal. There is no distension.     Palpations: Abdomen is soft. There is no mass.     Tenderness: There is no abdominal tenderness. There is no guarding or rebound.     Hernia: No hernia is present.  Genitourinary:    Cervix: No cervical motion tenderness, discharge, friability or erythema.     Uterus: Normal. Not enlarged.      Adnexa:        Right: No mass or tenderness.         Left: No mass or tenderness.    Musculoskeletal:        General: No tenderness or deformity. Normal range of motion.     Cervical back: Normal range of motion and neck supple.  Lymphadenopathy:     Cervical: No cervical adenopathy.  Skin:    General: Skin is warm and dry.     Capillary Refill: Capillary refill takes less than 2 seconds.     Coloration: Skin is not pale.     Findings: No erythema or rash.  Neurological:     Mental Status: She is alert and oriented to person, place, and time.     Cranial Nerves: No cranial nerve deficit.     Sensory: No sensory deficit.     Motor: No abnormal muscle tone.     Coordination: Coordination normal.     Deep Tendon Reflexes: Reflexes normal.           Assessment & Plan:  General medical exam  Mixed hyperlipidemia - Plan: CBC with Differential/Platelet, COMPLETE METABOLIC PANEL WITH GFR, Lipid panel, Microalbumin, urine  Essential hypertension, benign - Plan: CBC with Differential/Platelet, COMPLETE METABOLIC PANEL WITH GFR, Lipid panel,  Microalbumin, urine  Anemia, unspecified type - Plan: CBC with Differential/Platelet, COMPLETE METABOLIC PANEL WITH GFR, Lipid panel, Microalbumin, urine  Type 2 diabetes mellitus with hyperlipidemia (HCC) - Plan: CBC with Differential/Platelet, COMPLETE METABOLIC PANEL WITH GFR, Lipid panel, Microalbumin, urine, Hemoglobin A1c, PAP, Thin Prep w/HPV rflx HPV Type 16/18  Cervical cancer screening - Plan: PAP, Thin Prep w/HPV rflx HPV Type 16/18  Patient's most recent hemoglobin A1c was excellent.  Her blood pressure today is adequately controlled.  I will check a CMP and a fasting lipid panel.  Goal LDL cholesterol is less than 100.  Monitor for anemia with a CBC given her history of anemia.  Check a urine microalbumin to evaluate for diabetic nephropathy.  Pap smear was performed today with a chaperone present and was sent to pathology in a labeled container.  Mammogram is up-to-date.  Colonoscopy is up-to-date.  Did recommend the shingles vaccine at her earliest convenience.  Other immunizations are up-to-date.

## 2019-12-02 LAB — COMPLETE METABOLIC PANEL WITH GFR
AG Ratio: 1.2 (calc) (ref 1.0–2.5)
ALT: 10 U/L (ref 6–29)
AST: 15 U/L (ref 10–35)
Albumin: 4.2 g/dL (ref 3.6–5.1)
Alkaline phosphatase (APISO): 76 U/L (ref 37–153)
BUN/Creatinine Ratio: 16 (calc) (ref 6–22)
BUN: 22 mg/dL (ref 7–25)
CO2: 27 mmol/L (ref 20–32)
Calcium: 9.6 mg/dL (ref 8.6–10.4)
Chloride: 102 mmol/L (ref 98–110)
Creat: 1.34 mg/dL — ABNORMAL HIGH (ref 0.50–1.05)
GFR, Est African American: 50 mL/min/{1.73_m2} — ABNORMAL LOW (ref >=60)
GFR, Est Non African American: 43 mL/min/{1.73_m2} — ABNORMAL LOW (ref >=60)
Globulin: 3.4 g/dL (calc) (ref 1.9–3.7)
Glucose, Bld: 115 mg/dL — ABNORMAL HIGH (ref 65–99)
Potassium: 4.4 mmol/L (ref 3.5–5.3)
Sodium: 138 mmol/L (ref 135–146)
Total Bilirubin: 0.5 mg/dL (ref 0.2–1.2)
Total Protein: 7.6 g/dL (ref 6.1–8.1)

## 2019-12-02 LAB — CBC WITH DIFFERENTIAL/PLATELET
Absolute Monocytes: 441 cells/uL (ref 200–950)
Basophils Absolute: 52 cells/uL (ref 0–200)
Basophils Relative: 0.9 %
Eosinophils Absolute: 162 cells/uL (ref 15–500)
Eosinophils Relative: 2.8 %
HCT: 35.4 % (ref 35.0–45.0)
Hemoglobin: 11.2 g/dL — ABNORMAL LOW (ref 11.7–15.5)
Lymphs Abs: 1427 cells/uL (ref 850–3900)
MCH: 26.7 pg — ABNORMAL LOW (ref 27.0–33.0)
MCHC: 31.6 g/dL — ABNORMAL LOW (ref 32.0–36.0)
MCV: 84.3 fL (ref 80.0–100.0)
MPV: 10.8 fL (ref 7.5–12.5)
Monocytes Relative: 7.6 %
Neutro Abs: 3718 cells/uL (ref 1500–7800)
Neutrophils Relative %: 64.1 %
Platelets: 274 10*3/uL (ref 140–400)
RBC: 4.2 10*6/uL (ref 3.80–5.10)
RDW: 14 % (ref 11.0–15.0)
Total Lymphocyte: 24.6 %
WBC: 5.8 10*3/uL (ref 3.8–10.8)

## 2019-12-02 LAB — LIPID PANEL
Cholesterol: 156 mg/dL (ref <200)
HDL: 43 mg/dL — ABNORMAL LOW (ref >=50)
LDL Cholesterol (Calc): 96 mg/dL (calc)
Non-HDL Cholesterol (Calc): 113 mg/dL (calc) (ref <130)
Total CHOL/HDL Ratio: 3.6 (calc) (ref <5.0)
Triglycerides: 81 mg/dL (ref <150)

## 2019-12-02 LAB — MICROALBUMIN, URINE: Microalb, Ur: 0.7 mg/dL

## 2019-12-05 LAB — PAP, TP IMAGING W/ HPV RNA, RFLX HPV TYPE 16,18/45: HPV DNA High Risk: NOT DETECTED

## 2019-12-16 ENCOUNTER — Other Ambulatory Visit: Payer: Self-pay | Admitting: Endocrinology

## 2019-12-19 ENCOUNTER — Other Ambulatory Visit: Payer: Self-pay | Admitting: Endocrinology

## 2019-12-21 ENCOUNTER — Other Ambulatory Visit: Payer: Self-pay

## 2019-12-21 ENCOUNTER — Other Ambulatory Visit: Payer: BC Managed Care – PPO

## 2019-12-21 DIAGNOSIS — N179 Acute kidney failure, unspecified: Secondary | ICD-10-CM

## 2019-12-21 LAB — BASIC METABOLIC PANEL
BUN/Creatinine Ratio: 12 (calc) (ref 6–22)
BUN: 13 mg/dL (ref 7–25)
CO2: 28 mmol/L (ref 20–32)
Calcium: 9.6 mg/dL (ref 8.6–10.4)
Chloride: 102 mmol/L (ref 98–110)
Creat: 1.06 mg/dL — ABNORMAL HIGH (ref 0.50–1.05)
Glucose, Bld: 208 mg/dL — ABNORMAL HIGH (ref 65–99)
Potassium: 4.9 mmol/L (ref 3.5–5.3)
Sodium: 137 mmol/L (ref 135–146)

## 2020-01-19 ENCOUNTER — Other Ambulatory Visit: Payer: Self-pay | Admitting: Endocrinology

## 2020-02-12 ENCOUNTER — Other Ambulatory Visit: Payer: Self-pay | Admitting: Endocrinology

## 2020-03-12 ENCOUNTER — Other Ambulatory Visit: Payer: Self-pay | Admitting: Endocrinology

## 2020-03-18 ENCOUNTER — Other Ambulatory Visit (INDEPENDENT_AMBULATORY_CARE_PROVIDER_SITE_OTHER): Payer: BC Managed Care – PPO

## 2020-03-18 ENCOUNTER — Other Ambulatory Visit: Payer: Self-pay | Admitting: Family Medicine

## 2020-03-18 ENCOUNTER — Other Ambulatory Visit: Payer: Self-pay

## 2020-03-18 DIAGNOSIS — E119 Type 2 diabetes mellitus without complications: Secondary | ICD-10-CM | POA: Diagnosis not present

## 2020-03-18 DIAGNOSIS — E7800 Pure hypercholesterolemia, unspecified: Secondary | ICD-10-CM

## 2020-03-18 DIAGNOSIS — Z1231 Encounter for screening mammogram for malignant neoplasm of breast: Secondary | ICD-10-CM

## 2020-03-18 LAB — MICROALBUMIN / CREATININE URINE RATIO
Creatinine,U: 97.4 mg/dL
Microalb Creat Ratio: 1.2 mg/g (ref 0.0–30.0)
Microalb, Ur: 1.1 mg/dL (ref 0.0–1.9)

## 2020-03-18 LAB — HEMOGLOBIN A1C: Hgb A1c MFr Bld: 6.8 % — ABNORMAL HIGH (ref 4.6–6.5)

## 2020-03-18 LAB — LIPID PANEL
Cholesterol: 141 mg/dL (ref 0–200)
HDL: 44.3 mg/dL (ref >39.00)
LDL Cholesterol: 85 mg/dL (ref 0–99)
NonHDL: 97.17
Total CHOL/HDL Ratio: 3
Triglycerides: 63 mg/dL (ref 0.0–149.0)
VLDL: 12.6 mg/dL (ref 0.0–40.0)

## 2020-03-18 LAB — COMPREHENSIVE METABOLIC PANEL
ALT: 14 U/L (ref 0–35)
AST: 20 U/L (ref 0–37)
Albumin: 4.3 g/dL (ref 3.5–5.2)
Alkaline Phosphatase: 65 U/L (ref 39–117)
BUN: 17 mg/dL (ref 6–23)
CO2: 31 mEq/L (ref 19–32)
Calcium: 9.9 mg/dL (ref 8.4–10.5)
Chloride: 102 mEq/L (ref 96–112)
Creatinine, Ser: 1.22 mg/dL — ABNORMAL HIGH (ref 0.40–1.20)
GFR: 54.37 mL/min — ABNORMAL LOW (ref >60.00)
Glucose, Bld: 90 mg/dL (ref 70–99)
Potassium: 5 mEq/L (ref 3.5–5.1)
Sodium: 140 mEq/L (ref 135–145)
Total Bilirubin: 0.6 mg/dL (ref 0.2–1.2)
Total Protein: 8 g/dL (ref 6.0–8.3)

## 2020-03-21 ENCOUNTER — Other Ambulatory Visit: Payer: Self-pay

## 2020-03-21 ENCOUNTER — Ambulatory Visit: Payer: BC Managed Care – PPO | Admitting: Endocrinology

## 2020-03-21 ENCOUNTER — Encounter: Payer: Self-pay | Admitting: Endocrinology

## 2020-03-21 VITALS — BP 134/74 | HR 85 | Ht 65.0 in | Wt 148.0 lb

## 2020-03-21 DIAGNOSIS — Z23 Encounter for immunization: Secondary | ICD-10-CM

## 2020-03-21 DIAGNOSIS — E1165 Type 2 diabetes mellitus with hyperglycemia: Secondary | ICD-10-CM

## 2020-03-21 DIAGNOSIS — E7800 Pure hypercholesterolemia, unspecified: Secondary | ICD-10-CM | POA: Diagnosis not present

## 2020-03-21 DIAGNOSIS — I10 Essential (primary) hypertension: Secondary | ICD-10-CM

## 2020-03-21 NOTE — Progress Notes (Signed)
Patient ID: Sherry Hart, female   DOB: 12-08-1960, 59 y.o.   MRN: [id number]    Reason for Appointment : Followup  History of Present Illness          Type 2 diabetes mellitus, date of diagnosis: 2002       Past history: She had been treated with metformin since diagnosis and previous records are not available for review. She thinks that when she was taking her blood sugar regularly there were usually below 150 in the morning In 2012 because of lack of insurance she did not take her medications or check her sugar for about a year In 6/13 she was evaluated in the emergency room for abdominal pain and was found to have an A1c of 14% with very high blood sugars. Her only symptoms were weight loss, treated with insulin for short-term. She was seen for initial consultation in 1/15 and at that time her glucose was 431 with A1c of 13.1 Initially treated with insulin but this was later r+eplaced with oral agents She started taking Kombiglyze XR on 08/08/13 and had gone up to 2 tablets daily Because of tendency to relatively higher fasting readings she was given low-dose Amaryl in 3/15 in addition  She has been on SGLT2 drugs, Invokana or Jardiance since 10/2014  She has been on Trulicity 6.22 mg weekly since 12/05/16 when her A1c was 8.3  Recent history:   Non-insulin hypoglycemic drugs: Trulicity 2.97 mg weekly,Janumet XR 100/1000 daily, Jardiance 12.5 mg at dinner  A1c is slightly higher at 6.8 compared to 6.4  Current blood sugars at home and management:  Not clear why her A1c is higher since blood sugars are mostly near normal at home  However not checking readings after meals much  Current RANGE 71-121 recently with average 100, most readings in the mornings and midday  She says that previously she was walking up to 2 miles several days a week but now because of going back to work she has not been doing any formal exercise  Weight is up only 2 pounds  Her  blood sugars tend to be slightly higher fasting  Last visit was not in person  Renal function is normal although slightly worse with current dose of Jardiance    Side effects from medications have been:  GI side effects from 2000 mg metformin ER  Glucose monitoring:  As below    Glucometer:  One Touch 2     Blood Glucose readings from review of meter as above  Previously available data:  PRE-MEAL Fasting Lunch Dinner Bedtime Overall  Glucose range:  121, 126   75  83   Mean/median:        POST-MEAL PC Breakfast PC Lunch PC Dinner  Glucose range: 86  125   Mean/median:         Glycemic control:   Lab Results  Component Value Date   HGBA1C 6.8 (H) 03/18/2020   HGBA1C 6.4 11/14/2019   HGBA1C 6.6 (H) 07/14/2019   Lab Results  Component Value Date   MICROALBUR 1.1 03/18/2020   LDLCALC 85 03/18/2020   CREATININE 1.22 (H) 03/18/2020   Self-care: The diet that the patient has been following is:  usually low fat, low carbs, Variable.    Usually has fruit for snacks   Avoiding drinks with sugar, dinner is at 5-7 pm           Dietician visit: Most recent:  01/01/17  Compliance with the medical regimen: fair  Weight history: 125 upto 173, her lowest weight was in 11/2011  Wt Readings from Last 3 Encounters:  03/21/20 148 lb (67.1 kg)  12/01/19 146 lb (66.2 kg)  03/14/19 151 lb 3.2 oz (68.6 kg)   Lab on 03/18/2020  Component Date Value Ref Range Status  . Cholesterol 03/18/2020 141  0 - 200 mg/dL Final   ATP III Classification       Desirable:  < 200 mg/dL               Borderline High:  200 - 239 mg/dL          High:  > = 240 mg/dL  . Triglycerides 03/18/2020 63.0  0 - 149 mg/dL Final   Normal:  <150 mg/dLBorderline High:  150 - 199 mg/dL  . HDL 03/18/2020 44.30  >39.00 mg/dL Final  . VLDL 03/18/2020 12.6  0.0 - 40.0 mg/dL Final  . LDL Cholesterol 03/18/2020 85  0 - 99 mg/dL Final  . Total CHOL/HDL Ratio 03/18/2020 3   Final                  Men           Women1/2 Average Risk     3.4          3.3Average Risk          5.0          4.42X Average Risk          9.6          7.13X Average Risk          15.0          11.0                      . NonHDL 03/18/2020 97.17   Final   NOTE:  Non-HDL goal should be 30 mg/dL higher than patient's LDL goal (i.e. LDL goal of < 70 mg/dL, would have non-HDL goal of < 100 mg/dL)  . Microalb, Ur 03/18/2020 1.1  0.0 - 1.9 mg/dL Final  . Creatinine,U 03/18/2020 97.4  mg/dL Final  . Microalb Creat Ratio 03/18/2020 1.2  0.0 - 30.0 mg/g Final  . Sodium 03/18/2020 140  135 - 145 mEq/L Final  . Potassium 03/18/2020 5.0  3.5 - 5.1 mEq/L Final  . Chloride 03/18/2020 102  96 - 112 mEq/L Final  . CO2 03/18/2020 31  19 - 32 mEq/L Final  . Glucose, Bld 03/18/2020 90  70 - 99 mg/dL Final  . BUN 03/18/2020 17  6 - 23 mg/dL Final  . Creatinine, Ser 03/18/2020 1.22* 0.40 - 1.20 mg/dL Final  . Total Bilirubin 03/18/2020 0.6  0.2 - 1.2 mg/dL Final  . Alkaline Phosphatase 03/18/2020 65  39 - 117 U/L Final  . AST 03/18/2020 20  0 - 37 U/L Final  . ALT 03/18/2020 14  0 - 35 U/L Final  . Total Protein 03/18/2020 8.0  6.0 - 8.3 g/dL Final  . Albumin 03/18/2020 4.3  3.5 - 5.2 g/dL Final  . GFR 03/18/2020 54.37* >60.00 mL/min Final  . Calcium 03/18/2020 9.9  8.4 - 10.5 mg/dL Final  . Hgb A1c MFr Bld 03/18/2020 6.8* 4.6 - 6.5 % Final   Glycemic Control Guidelines for People with Diabetes:Non Diabetic:  <6%Goal of Therapy: <7%Additional Action Suggested:  >8%    Other active problems: See review of systems     Allergies  as of 03/21/2020      Reactions   Penicillins Hives   Back of neck and upper back.      Medication List       Accurate as of March 21, 2020 11:59 PM. If you have any questions, ask your nurse or doctor.        aspirin 81 MG tablet Take 81 mg by mouth daily.   atorvastatin 20 MG tablet Commonly known as: LIPITOR TAKE 1 TABLET BY MOUTH EVERY DAY   empagliflozin 25 MG Tabs tablet Commonly known as:  Jardiance Take 1/2 tablet (12.53m total) by mouth once daily.   ferrous sulfate 325 (65 FE) MG EC tablet Take 325 mg by mouth daily. Take 1 tablet by mouth daily.   Janumet XR [phone] MG Tb24 Generic drug: SitaGLIPtin-MetFORMIN HCl TAKE 1 TABLET BY MOUTH EVERY DAY   losartan 100 MG tablet Commonly known as: COZAAR TAKE 1 TABLET BY MOUTH EVERY DAY   ONE TOUCH ULTRA 2 w/Device Kit Use to check blood sugar 3 times per day dx code E11.65   OneTouch Ultra test strip Generic drug: glucose blood USE AS DIRECTED   onetouch ultrasoft lancets Use as instructed   Trulicity 07.00MFV/4.9SWSopn Generic drug: Dulaglutide INJECT IN THE ABDOMINAL SKIN AS DIRECTED ONCE A WEEK   vitamin B-12 500 MCG tablet Commonly known as: CYANOCOBALAMIN Take 1,000 mcg by mouth daily. Take 1 tablet by mouth daily.       Allergies:  Allergies  Allergen Reactions  . Penicillins Hives    Back of neck and upper back.    Past Medical History:  Diagnosis Date  . Abdominal pain   . Anemia   . B12 deficiency   . Biliary colic   . Constipation    occasionally not chronic per pt. -no medicines for this   . Diabetes mellitus 12 yrs ago   . Hyperlipidemia   . Hypertension   . Pernicious anemia   . Unexplained weight loss   . Vomiting   . Weakness     Past Surgical History:  Procedure Laterality Date  . CESAREAN SECTION  11/29/1990, 04/06/1994  . COLONOSCOPY  09/01/2004   all normal - in epic  . KNEE SURGERY  11/2000   right  . MOUTH SURGERY  04/04/1987  . UPPER GASTROINTESTINAL ENDOSCOPY  09-01-04   gastric polyp- m.johnson- in epic  . WISDOM TOOTH EXTRACTION      Family History  Problem Relation Age of Onset  . Cancer Mother        breast  . Breast cancer Mother 540 . Diabetes Father   . Thyroid disease Sister   . Hypertension Neg Hx   . Colon cancer Neg Hx   . Colon polyps Neg Hx   . Rectal cancer Neg Hx   . Stomach cancer Neg Hx     Social History:  reports that she has never  smoked. She has never used smokeless tobacco. She reports that she does not drink alcohol and does not use drugs.    Review of Systems     HYPERTENSION: blood pressure has been treated with Cozaar 1014m    She has checked readings at home regularly  Her blood pressure 122/74 recently She has not brought her home monitor for comparison Blood pressure may be higher initially on first measurement   BP Readings from Last 3 Encounters:  03/21/20 134/74  12/01/19 135/81  03/14/19 (!) 142/80   RENAL function: As below  Previously the high creatinine level had improved with cutting Jardiance in half   Lab Results  Component Value Date   CREATININE 1.22 (H) 03/18/2020   CREATININE 1.06 (H) 12/21/2019   CREATININE 1.34 (H) 12/01/2019   Generally has borderline potassium level  Lab Results  Component Value Date   K 5.0 03/18/2020     Lipids:   She has been taking Lipitor from PCP LDL is controlled with the 20 mg dosage    Lab Results  Component Value Date   CHOL 141 03/18/2020   HDL 44.30 03/18/2020   LDLCALC 85 03/18/2020   LDLDIRECT 175.6 07/28/2013   TRIG 63.0 03/18/2020   CHOLHDL 3 03/18/2020     Physical Examination:  BP 134/74   Pulse 85   Ht '5\' 5"'  (1.651 m)   Wt 148 lb (67.1 kg)   SpO2 99%   BMI 24.63 kg/m    Diabetic Foot Exam - Simple   Simple Foot Form Diabetic Foot exam was performed with the following findings: Yes   Visual Inspection No deformities, no ulcerations, no other skin breakdown bilaterally: Yes Sensation Testing Intact to touch and monofilament testing bilaterally: Yes Pulse Check Posterior Tibialis and Dorsalis pulse intact bilaterally: Yes Comments No ankle edema      ASSESSMENT/PLAN:   Diabetes type 2, nonobese  See history of present illness for  discussion of  current management, blood sugar patterns and problems identified  Her A1c is relatively higher at 6.8  As discussed above her blood sugars at home do not  low however she will not be checking enough after meals Also she has not done any exercise which had helped her level of control previously She was encouraged to start some exercise at home even if it is indoor aerobic activity She will rotate the times of her blood sugar monitoring and more after meals No change in Trulicity or Jardiance which she is tolerating well  RENAL dysfunction: Mild with some variability, likely from interaction of losartan and Jardiance  Hypertension: well controlled, need to check more regularly at home Microalbumin normal  Lipids: Excellent control with 20 mg atorvastatin    Follow-up in 4 months  Influenza vaccine given  To have booster vaccine for Covid after her mammogram  There are no Patient Instructions on file for this visit.    Elayne Snare 03/22/2020, 8:22 AM   Note: This office note was prepared with Dragon voice recognition system technology. Any transcriptional errors that result from this process are unintentional.

## 2020-04-07 ENCOUNTER — Other Ambulatory Visit: Payer: Self-pay | Admitting: Endocrinology

## 2020-04-19 ENCOUNTER — Other Ambulatory Visit: Payer: Self-pay | Admitting: Endocrinology

## 2020-04-29 ENCOUNTER — Other Ambulatory Visit: Payer: Self-pay

## 2020-04-29 ENCOUNTER — Ambulatory Visit
Admission: RE | Admit: 2020-04-29 | Discharge: 2020-04-29 | Disposition: A | Discharge status: Home / Self Care | Payer: BC Managed Care – PPO | Source: Ambulatory Visit | Attending: Family Medicine | Admitting: Family Medicine

## 2020-04-29 DIAGNOSIS — Z1231 Encounter for screening mammogram for malignant neoplasm of breast: Secondary | ICD-10-CM

## 2020-05-14 ENCOUNTER — Other Ambulatory Visit: Payer: Self-pay | Admitting: Endocrinology

## 2020-05-14 DIAGNOSIS — E1165 Type 2 diabetes mellitus with hyperglycemia: Secondary | ICD-10-CM

## 2020-05-30 ENCOUNTER — Other Ambulatory Visit: Payer: Self-pay | Admitting: Endocrinology

## 2020-06-17 ENCOUNTER — Other Ambulatory Visit: Payer: Self-pay

## 2020-06-17 ENCOUNTER — Other Ambulatory Visit (INDEPENDENT_AMBULATORY_CARE_PROVIDER_SITE_OTHER): Payer: BC Managed Care – PPO

## 2020-06-17 DIAGNOSIS — E1165 Type 2 diabetes mellitus with hyperglycemia: Secondary | ICD-10-CM

## 2020-06-17 LAB — COMPREHENSIVE METABOLIC PANEL
ALT: 17 U/L (ref 0–35)
AST: 21 U/L (ref 0–37)
Albumin: 4.5 g/dL (ref 3.5–5.2)
Alkaline Phosphatase: 64 U/L (ref 39–117)
BUN: 22 mg/dL (ref 6–23)
CO2: 27 mEq/L (ref 19–32)
Calcium: 10 mg/dL (ref 8.4–10.5)
Chloride: 103 mEq/L (ref 96–112)
Creatinine, Ser: 1.09 mg/dL (ref 0.40–1.20)
GFR: 55.26 mL/min — ABNORMAL LOW (ref >60.00)
Glucose, Bld: 102 mg/dL — ABNORMAL HIGH (ref 70–99)
Potassium: 4.8 mEq/L (ref 3.5–5.1)
Sodium: 139 mEq/L (ref 135–145)
Total Bilirubin: 0.5 mg/dL (ref 0.2–1.2)
Total Protein: 8.2 g/dL (ref 6.0–8.3)

## 2020-06-17 LAB — TSH: TSH: 3.78 u[IU]/mL (ref 0.35–4.50)

## 2020-06-17 LAB — HEMOGLOBIN A1C: Hgb A1c MFr Bld: 6.6 % — ABNORMAL HIGH (ref 4.6–6.5)

## 2020-06-20 ENCOUNTER — Other Ambulatory Visit: Payer: Self-pay

## 2020-06-20 ENCOUNTER — Ambulatory Visit: Payer: BC Managed Care – PPO | Admitting: Endocrinology

## 2020-06-20 ENCOUNTER — Encounter: Payer: Self-pay | Admitting: Endocrinology

## 2020-06-20 VITALS — BP 150/82 | HR 68 | Ht 65.0 in | Wt 148.0 lb

## 2020-06-20 DIAGNOSIS — E1165 Type 2 diabetes mellitus with hyperglycemia: Secondary | ICD-10-CM

## 2020-06-20 DIAGNOSIS — I10 Essential (primary) hypertension: Secondary | ICD-10-CM | POA: Diagnosis not present

## 2020-06-20 NOTE — Patient Instructions (Signed)
Check blood sugars on waking up 2-3 days a week  Also check blood sugars about 2 hours after meals and do this after different meals by rotation  Recommended blood sugar levels on waking up are 90-130 and about 2 hours after meal is 130-160  Please bring your blood sugar monitor to each visit, thank you  Check BP weekly at least

## 2020-06-20 NOTE — Progress Notes (Signed)
Patient ID: Sherry Hart, female   DOB: [birth year], 60 y.o.   MRN: [id number]    Reason for Appointment : Followup  History of Present Illness          Type 2 diabetes mellitus, date of diagnosis: 2002       Past history: She had been treated with metformin since diagnosis and previous records are not available for review. She thinks that when she was taking her blood sugar regularly there were usually below 150 in the morning In 2012 because of lack of insurance she did not take her medications or check her sugar for about a year In 6/13 she was evaluated in the emergency room for abdominal pain and was found to have an A1c of 14% with very high blood sugars. Her only symptoms were weight loss, treated with insulin for short-term. She was seen for initial consultation in 1/15 and at that time her glucose was 431 with A1c of 13.1 Initially treated with insulin but this was later r+eplaced with oral agents She started taking Kombiglyze XR on 08/08/13 and had gone up to 2 tablets daily Because of tendency to relatively higher fasting readings she was given low-dose Amaryl in 3/15 in addition  She has been on SGLT2 drugs, Invokana or Jardiance since 10/2014  She has been on Trulicity 0.01 mg weekly since 12/05/16 when her A1c was 8.3  Recent history:   Non-insulin hypoglycemic drugs: Trulicity 7.49 mg weekly,Janumet XR 100/1000 daily, Jardiance 12.5 mg at dinner  A1c is slightly improved at 6.6 compared to 6.8   Current blood sugars at home and management:  Not checking blood sugars regularly at home and only 6 readings in the last month  She is now better with exercise and is trying to use an exercise bike at home if she cannot walk  As before she usually tries to monitor her diet and carbohydrates  Weight has leveled off recently  She forgets to check her readings after meals  Lab glucose was 102 late morning fasting  Has continued same doses of her  diabetes medications with no side effects.  Previously on Amaryl which was causing some tendency to hypoglycemia    Side effects from medications have been:  GI side effects from 2000 mg metformin ER  Glucose monitoring:  As below    Glucometer:  One Touch 2     Blood Glucose readings from meter download  Blood sugar range 67-120, average 102 with most readings around midday  Previously available data:  PRE-MEAL Fasting Lunch Dinner Bedtime Overall  Glucose range:  121, 126   75  83   Mean/median:        POST-MEAL PC Breakfast PC Lunch PC Dinner  Glucose range: 86  125   Mean/median:       Glycemic control:   Lab Results  Component Value Date   HGBA1C 6.6 (H) 06/17/2020   HGBA1C 6.8 (H) 03/18/2020   HGBA1C 6.4 11/14/2019   Lab Results  Component Value Date   MICROALBUR 1.1 03/18/2020   LDLCALC 85 03/18/2020   CREATININE 1.09 06/17/2020   Self-care: The diet that the patient has been following is:  usually low fat, low carbs, Variable.    Usually has fruit for snacks   Avoiding drinks with sugar, dinner is at 5-7 pm           Dietician visit: Most recent:  01/01/17  Compliance with the medical regimen: fair  Weight history: 125 upto 173, her lowest weight was in 11/2011  Wt Readings from Last 3 Encounters:  06/20/20 148 lb (67.1 kg)  03/21/20 148 lb (67.1 kg)  12/01/19 146 lb (66.2 kg)   Lab on 06/17/2020  Component Date Value Ref Range Status  . TSH 06/17/2020 3.78  0.35 - 4.50 uIU/mL Final  . Sodium 06/17/2020 139  135 - 145 mEq/L Final  . Potassium 06/17/2020 4.8  3.5 - 5.1 mEq/L Final  . Chloride 06/17/2020 103  96 - 112 mEq/L Final  . CO2 06/17/2020 27  19 - 32 mEq/L Final  . Glucose, Bld 06/17/2020 102* 70 - 99 mg/dL Final  . BUN 06/17/2020 22  6 - 23 mg/dL Final  . Creatinine, Ser 06/17/2020 1.09  0.40 - 1.20 mg/dL Final  . Total Bilirubin 06/17/2020 0.5  0.2 - 1.2 mg/dL Final  . Alkaline Phosphatase 06/17/2020 64  39 - 117 U/L Final   . AST 06/17/2020 21  0 - 37 U/L Final  . ALT 06/17/2020 17  0 - 35 U/L Final  . Total Protein 06/17/2020 8.2  6.0 - 8.3 g/dL Final  . Albumin 06/17/2020 4.5  3.5 - 5.2 g/dL Final  . GFR 06/17/2020 55.26* >60.00 mL/min Final   Calculated using the CKD-EPI Creatinine Equation (2021)  . Calcium 06/17/2020 10.0  8.4 - 10.5 mg/dL Final  . Hgb A1c MFr Bld 06/17/2020 6.6* 4.6 - 6.5 % Final   Glycemic Control Guidelines for People with Diabetes:Non Diabetic:  <6%Goal of Therapy: <7%Additional Action Suggested:  >8%    Other active problems: See review of systems     Allergies as of 06/20/2020      Reactions   Penicillins Hives   Back of neck and upper back.      Medication List       Accurate as of June 20, 2020 11:59 PM. If you have any questions, ask your nurse or doctor.        aspirin 81 MG tablet Take 81 mg by mouth daily.   atorvastatin 20 MG tablet Commonly known as: LIPITOR TAKE 1 TABLET BY MOUTH EVERY DAY   calcium carbonate 1250 (500 Ca) MG chewable tablet Commonly known as: OS-CAL Chew 1 tablet by mouth daily.   cholecalciferol 25 MCG (1000 UNIT) tablet Commonly known as: VITAMIN D3 Take 1,000 Units by mouth daily.   empagliflozin 25 MG Tabs tablet Commonly known as: Jardiance Take 1/2 tablet (12.33m total) by mouth once daily.   ferrous sulfate 325 (65 FE) MG EC tablet Take 325 mg by mouth daily. Take 1 tablet by mouth daily.   Janumet XR [phone] MG Tb24 Generic drug: SitaGLIPtin-MetFORMIN HCl TAKE 1 TABLET BY MOUTH EVERY DAY   losartan 100 MG tablet Commonly known as: COZAAR TAKE 1 TABLET BY MOUTH EVERY DAY   ONE TOUCH ULTRA 2 w/Device Kit Use to check blood sugar 3 times per day dx code EA21.30  OneTouch Delica Lancets 386VMisc USE AS INSTRUCTED   OneTouch Ultra test strip Generic drug: glucose blood USE AS DIRECTED   Trulicity 07.84MON/6.2XBSopn Generic drug: Dulaglutide INJECT IN THE ABDOMINAL SKIN AS DIRECTED ONCE A WEEK   vitamin  B-12 500 MCG tablet Commonly known as: CYANOCOBALAMIN Take 1,000 mcg by mouth daily. Take 1 tablet by mouth daily.       Allergies:  Allergies  Allergen Reactions  . Penicillins Hives    Back of neck and  upper back.    Past Medical History:  Diagnosis Date  . Abdominal pain   . Anemia   . B12 deficiency   . Biliary colic   . Constipation    occasionally not chronic per pt. -no medicines for this   . Diabetes mellitus 12 yrs ago   . Hyperlipidemia   . Hypertension   . Pernicious anemia   . Unexplained weight loss   . Vomiting   . Weakness     Past Surgical History:  Procedure Laterality Date  . CESAREAN SECTION  11/29/1990, 04/06/1994  . COLONOSCOPY  09/01/2004   all normal - in epic  . KNEE SURGERY  11/2000   right  . MOUTH SURGERY  04/04/1987  . UPPER GASTROINTESTINAL ENDOSCOPY  09-01-04   gastric polyp- m.johnson- in epic  . WISDOM TOOTH EXTRACTION      Family History  Problem Relation Age of Onset  . Cancer Mother        breast  . Breast cancer Mother 54  . Diabetes Father   . Thyroid disease Sister   . Hypertension Neg Hx   . Colon cancer Neg Hx   . Colon polyps Neg Hx   . Rectal cancer Neg Hx   . Stomach cancer Neg Hx     Social History:  reports that she has never smoked. She has never used smokeless tobacco. She reports that she does not drink alcohol and does not use drugs.    Review of Systems     HYPERTENSION: blood pressure has been treated with Cozaar 100mg     She has not checked readings at home regularly and does not remember the last reading  Blood pressure appears to be higher than usual but she thinks she has had some more stress at work and recently more insomnia  Blood pressure may be higher initially on first measurement   BP Readings from Last 3 Encounters:  06/20/20 (!) 150/82  03/21/20 134/74  12/01/19 135/81   RENAL function: As below  Previously the high creatinine level had improved with reducing Jardiance to 12.5  mg    Lab Results  Component Value Date   CREATININE 1.09 06/17/2020   CREATININE 1.22 (H) 03/18/2020   CREATININE 1.06 (H) 12/21/2019   Generally has borderline potassium level  Lab Results  Component Value Date   K 4.8 06/17/2020     Lipids:   She has been taking Lipitor from PCP LDL is controlled with the 20 mg dosage    Lab Results  Component Value Date   CHOL 141 03/18/2020   HDL 44.30 03/18/2020   LDLCALC 85 03/18/2020   LDLDIRECT 175.6 07/28/2013   TRIG 63.0 03/18/2020   CHOLHDL 3 03/18/2020     Physical Examination:  BP (!) 150/82   Pulse 68   Ht 5' 5" (1.651 m)   Wt 148 lb (67.1 kg)   SpO2 98%   BMI 24.63 kg/m      ASSESSMENT/PLAN:   Diabetes type 2, nonobese  See history of present illness for  discussion of  current management, blood sugar patterns and problems identified  Her A1c is relatively better at 6.6  She is doing somewhat better with regular exercise, usually does well on diet Blood sugars are very infrequently monitored at home and mostly before/after lunch A1c higher than expected for her median blood sugar 109 at home  Currently since she has good control will not increase her Trulicity but this may be potentially needed   to improve her A1c in the future  RENAL dysfunction: Mild with some variability, likely from interaction of losartan and Jardiance  Hypertension: Blood pressure is higher than usual although not clear if this is related to recent stress and insomnia She is not checking at home and will start doing so at least once a week She will call if her diastolic readings are over 85 or systolic over 140 consistently  Lipids: Has had good control with 20 mg atorvastatin    Follow-up in 3 months     Patient Instructions  Check blood sugars on waking up 2-3 days a week  Also check blood sugars about 2 hours after meals and do this after different meals by rotation  Recommended blood sugar levels on waking up are  90-130 and about 2 hours after meal is 130-160  Please bring your blood sugar monitor to each visit, thank you  Check BP weekly at least     Ajay Kumar 06/21/2020, 8:14 AM   Note: This office note was prepared with Dragon voice recognition system technology. Any transcriptional errors that result from this process are unintentional.  

## 2020-06-21 ENCOUNTER — Encounter: Payer: Self-pay | Admitting: Endocrinology

## 2020-07-20 ENCOUNTER — Other Ambulatory Visit: Payer: Self-pay | Admitting: Endocrinology

## 2020-07-30 ENCOUNTER — Other Ambulatory Visit: Payer: Self-pay | Admitting: Endocrinology

## 2020-09-23 ENCOUNTER — Other Ambulatory Visit (INDEPENDENT_AMBULATORY_CARE_PROVIDER_SITE_OTHER): Payer: BC Managed Care – PPO

## 2020-09-23 ENCOUNTER — Other Ambulatory Visit: Payer: Self-pay

## 2020-09-23 DIAGNOSIS — I10 Essential (primary) hypertension: Secondary | ICD-10-CM

## 2020-09-23 DIAGNOSIS — E1165 Type 2 diabetes mellitus with hyperglycemia: Secondary | ICD-10-CM | POA: Diagnosis not present

## 2020-09-23 LAB — BASIC METABOLIC PANEL
BUN: 17 mg/dL (ref 6–23)
CO2: 27 mEq/L (ref 19–32)
Calcium: 10.2 mg/dL (ref 8.4–10.5)
Chloride: 103 mEq/L (ref 96–112)
Creatinine, Ser: 1.1 mg/dL (ref 0.40–1.20)
GFR: 54.56 mL/min — ABNORMAL LOW (ref >60.00)
Glucose, Bld: 90 mg/dL (ref 70–99)
Potassium: 4.5 mEq/L (ref 3.5–5.1)
Sodium: 138 mEq/L (ref 135–145)

## 2020-09-23 LAB — HEMOGLOBIN A1C: Hgb A1c MFr Bld: 6.8 % — ABNORMAL HIGH (ref 4.6–6.5)

## 2020-09-26 ENCOUNTER — Encounter: Payer: Self-pay | Admitting: Endocrinology

## 2020-09-26 ENCOUNTER — Ambulatory Visit: Payer: BC Managed Care – PPO | Admitting: Endocrinology

## 2020-09-26 ENCOUNTER — Other Ambulatory Visit: Payer: Self-pay

## 2020-09-26 VITALS — BP 132/82 | HR 90 | Ht 65.0 in | Wt 145.0 lb

## 2020-09-26 DIAGNOSIS — E119 Type 2 diabetes mellitus without complications: Secondary | ICD-10-CM | POA: Diagnosis not present

## 2020-09-26 DIAGNOSIS — I10 Essential (primary) hypertension: Secondary | ICD-10-CM

## 2020-09-26 DIAGNOSIS — E7800 Pure hypercholesterolemia, unspecified: Secondary | ICD-10-CM | POA: Diagnosis not present

## 2020-09-26 NOTE — Progress Notes (Signed)
Patient ID: Sherry Hart, female   DOB: 1961/01/11, 60 y.o.   MRN: [id number]    Reason for Appointment : Followup  History of Present Illness          Type 2 diabetes mellitus, date of diagnosis: 2002       Past history: She had been treated with metformin since diagnosis and previous records are not available for review. She thinks that when she was taking her blood sugar regularly there were usually below 150 in the morning In 2012 because of lack of insurance she did not take her medications or check her sugar for about a year In 6/13 she was evaluated in the emergency room for abdominal pain and was found to have an A1c of 14% with very high blood sugars. Her only symptoms were weight loss, treated with insulin for short-term. She was seen for initial consultation in 1/15 and at that time her glucose was 431 with A1c of 13.1 Initially treated with insulin but this was later r+eplaced with oral agents She started taking Kombiglyze XR on 08/08/13 and had gone up to 2 tablets daily Because of tendency to relatively higher fasting readings she was given low-dose Amaryl in 3/15 in addition  She has been on SGLT2 drugs, Invokana or Jardiance since 10/2014  She has been on Trulicity 6.56 mg weekly since 12/05/16 when her A1c was 8.3  Recent history:   Non-insulin hypoglycemic drugs: Trulicity 8.12 mg weekly,Janumet XR 100/1000 daily, Jardiance 12.5 mg at dinner  A1c is  6.8   Current blood sugars at home and management:  Although her A1c is slightly higher than before her blood sugars overall appear to be in the normal range  However not checking readings after dinner  She does not like to do fingersticks  Also she thinks that she has not done regular walking lately partly because of weather issues  Her lab glucose was 90 although she had a reading of 118 right before coming here that morning  Test trips are not expired  Has only 5 readings in the last 30  days on her meter usually in the late morning or afternoon Her weight is down 3 pounds  Side effects from medications have been:  GI side effects from 2000 mg metformin ER  Glucose monitoring:  As below    Glucometer:  One Touch ultra 2     Blood Glucose readings from meter download   PRE-MEAL Fasting Lunch Dinner Bedtime Overall  Glucose range:  90-118   88, 99    Mean/median:     102   POST-MEAL PC Breakfast PC Lunch PC Dinner  Glucose range:   ?  Mean/median:        Blood sugar range 67-120, average 102 with most readings around midday   Glycemic control:   Lab Results  Component Value Date   HGBA1C 6.8 (H) 09/23/2020   HGBA1C 6.6 (H) 06/17/2020   HGBA1C 6.8 (H) 03/18/2020   Lab Results  Component Value Date   MICROALBUR 1.1 03/18/2020   Deepstep 85 03/18/2020   CREATININE 1.10 09/23/2020   Self-care: The diet that the patient has been following is:  usually low fat, low carbs, Variable.    Usually has fruit for snacks   Avoiding drinks with sugar, dinner is at 5-7 pm           Dietician visit: Most recent:  01/01/17  Compliance with the medical regimen: fair  Weight history: 125 upto 173, her lowest weight was in 11/2011  Wt Readings from Last 3 Encounters:  09/26/20 145 lb (65.8 kg)  06/20/20 148 lb (67.1 kg)  03/21/20 148 lb (67.1 kg)   Lab on 09/23/2020  Component Date Value Ref Range Status  . Hgb A1c MFr Bld 09/23/2020 6.8* 4.6 - 6.5 % Final   Glycemic Control Guidelines for People with Diabetes:Non Diabetic:  <6%Goal of Therapy: <7%Additional Action Suggested:  >8%   . Sodium 09/23/2020 138  135 - 145 mEq/L Final  . Potassium 09/23/2020 4.5  3.5 - 5.1 mEq/L Final  . Chloride 09/23/2020 103  96 - 112 mEq/L Final  . CO2 09/23/2020 27  19 - 32 mEq/L Final  . Glucose, Bld 09/23/2020 90  70 - 99 mg/dL Final  . BUN 09/23/2020 17  6 - 23 mg/dL Final  . Creatinine, Ser 09/23/2020 1.10  0.40 - 1.20 mg/dL Final  . GFR 09/23/2020 54.56* >60.00  mL/min Final   Calculated using the CKD-EPI Creatinine Equation (2021)  . Calcium 09/23/2020 10.2  8.4 - 10.5 mg/dL Final   Other active problems: See review of systems     Allergies as of 09/26/2020      Reactions   Penicillins Hives   Back of neck and upper back.      Medication List       Accurate as of September 26, 2020  4:05 PM. If you have any questions, ask your nurse or doctor.        aspirin 81 MG tablet Take 81 mg by mouth daily.   atorvastatin 20 MG tablet Commonly known as: LIPITOR TAKE 1 TABLET BY MOUTH EVERY DAY   calcium carbonate 1250 (500 Ca) MG chewable tablet Commonly known as: OS-CAL Chew 1 tablet by mouth daily.   cholecalciferol 25 MCG (1000 UNIT) tablet Commonly known as: VITAMIN D3 Take 1,000 Units by mouth daily.   empagliflozin 25 MG Tabs tablet Commonly known as: Jardiance Take 1/2 tablet (12.$RemoveBefore'5mg'iCUoONLIuxBHj$  total) by mouth once daily.   ferrous sulfate 325 (65 FE) MG EC tablet Take 325 mg by mouth daily. Take 1 tablet by mouth daily.   Janumet XR [id number] MG Tb24 Generic drug: SitaGLIPtin-MetFORMIN HCl TAKE 1 TABLET BY MOUTH EVERY DAY   losartan 100 MG tablet Commonly known as: COZAAR TAKE 1 TABLET BY MOUTH EVERY DAY   ONE TOUCH ULTRA 2 w/Device Kit Use to check blood sugar 3 times per day dx code U13.24   OneTouch Delica Lancets 40N Misc USE AS INSTRUCTED   OneTouch Ultra test strip Generic drug: glucose blood USE AS DIRECTED   Trulicity 0.27 OZ/3.6UY Sopn Generic drug: Dulaglutide INJECT IN THE ABDOMINAL SKIN AS DIRECTED ONCE A WEEK   vitamin B-12 500 MCG tablet Commonly known as: CYANOCOBALAMIN Take 1,000 mcg by mouth daily. Take 1 tablet by mouth daily.       Allergies:  Allergies  Allergen Reactions  . Penicillins Hives    Back of neck and upper back.    Past Medical History:  Diagnosis Date  . Abdominal pain   . Anemia   . B12 deficiency   . Biliary colic   . Constipation    occasionally not chronic per pt. -no  medicines for this   . Diabetes mellitus 12 yrs ago   . Hyperlipidemia   . Hypertension   . Pernicious anemia   . Unexplained weight loss   . Vomiting   .  Weakness     Past Surgical History:  Procedure Laterality Date  . CESAREAN SECTION  11/29/1990, 04/06/1994  . COLONOSCOPY  09/01/2004   all normal - in epic  . KNEE SURGERY  11/2000   right  . MOUTH SURGERY  04/04/1987  . UPPER GASTROINTESTINAL ENDOSCOPY  09-01-04   gastric polyp- m.johnson- in epic  . WISDOM TOOTH EXTRACTION      Family History  Problem Relation Age of Onset  . Cancer Mother        breast  . Breast cancer Mother 85  . Diabetes Father   . Thyroid disease Sister   . Hypertension Neg Hx   . Colon cancer Neg Hx   . Colon polyps Neg Hx   . Rectal cancer Neg Hx   . Stomach cancer Neg Hx     Social History:  reports that she has never smoked. She has never used smokeless tobacco. She reports that she does not drink alcohol and does not use drugs.    Review of Systems     HYPERTENSION: blood pressure has been treated with Cozaar $RemoveBef'100mg'idVWYYfsne$      She has checked readings at home regularly and is recently getting readings around 130-143/ 73-86  Blood pressure may be higher initially on first measurement   BP Readings from Last 3 Encounters:  09/26/20 132/82  06/20/20 (!) 150/82  03/21/20 134/74   RENAL function: As below  Previously had a slightly high creatinine level which improved with reducing Jardiance to 12.5 mg    Lab Results  Component Value Date   CREATININE 1.10 09/23/2020   CREATININE 1.09 06/17/2020   CREATININE 1.22 (H) 03/18/2020    Lab Results  Component Value Date   K 4.5 09/23/2020     Lipids:   She has been taking Lipitor long-term LDL is controlled with the 20 mg dosage    Lab Results  Component Value Date   CHOL 141 03/18/2020   HDL 44.30 03/18/2020   LDLCALC 85 03/18/2020   LDLDIRECT 175.6 07/28/2013   TRIG 63.0 03/18/2020   CHOLHDL 3 03/18/2020     Physical  Examination:  BP 132/82   Pulse 90   Ht $R'5\' 5"'PM$  (1.651 m)   Wt 145 lb (65.8 kg)   SpO2 98%   BMI 24.13 kg/m      ASSESSMENT/PLAN:   Diabetes type 2, nonobese  See history of present illness for  discussion of  current management, blood sugar patterns and problems identified  Her A1c is 6.8  She is on a regimen of Trulicity, Janumet 449/6759 and 12.5 mg Jardiance  Her blood sugars are mostly checked before meals and are mostly in the normal range However likely of her home meter is reading higher than the actual reading compared to the lab  Her A1c is again higher than expected for her actual blood sugars  For now we will continue the same regimen Encouraged her to start walking regularly Also periodically needs to check readings after dinner  RENAL dysfunction: This is very stable now She understands the need to stay hydrated especially in warm weather  Hypertension: Blood pressure is better compared to her last visit when she may have been anxious She has started checking at home and encouraged her to do so regularly She will follow-up with her PCP in June also  Follow-up in 4 months     There are no Patient Instructions on file for this visit.    Elayne Snare 09/26/2020, 4:05 PM  Note: This office note was prepared with Dragon voice recognition system technology. Any transcriptional errors that result from this process are unintentional.  

## 2020-09-26 NOTE — Patient Instructions (Addendum)
Check blood sugars on waking up 2 days a week  Also check blood sugars about 2 hours after meals and do this after different meals by rotation  Recommended blood sugar levels on waking up are 80-130 and about 2 hours after meal is 130-160  Please bring your blood sugar monitor to each visit, thank you  To need new meter next time

## 2020-10-11 ENCOUNTER — Other Ambulatory Visit: Payer: Self-pay | Admitting: Endocrinology

## 2020-10-17 ENCOUNTER — Other Ambulatory Visit: Payer: Self-pay | Admitting: Endocrinology

## 2020-11-21 LAB — HM DIABETES EYE EXAM

## 2020-12-02 ENCOUNTER — Other Ambulatory Visit: Payer: Self-pay | Admitting: Endocrinology

## 2020-12-03 ENCOUNTER — Other Ambulatory Visit: Payer: Self-pay

## 2020-12-03 ENCOUNTER — Encounter: Payer: Self-pay | Admitting: Family Medicine

## 2020-12-03 ENCOUNTER — Ambulatory Visit (INDEPENDENT_AMBULATORY_CARE_PROVIDER_SITE_OTHER): Payer: BC Managed Care – PPO | Admitting: Family Medicine

## 2020-12-03 VITALS — BP 138/72 | HR 88 | Temp 98.3°F | Resp 16 | Ht 65.0 in | Wt 149.0 lb

## 2020-12-03 DIAGNOSIS — E1169 Type 2 diabetes mellitus with other specified complication: Secondary | ICD-10-CM | POA: Diagnosis not present

## 2020-12-03 DIAGNOSIS — Z0001 Encounter for general adult medical examination with abnormal findings: Secondary | ICD-10-CM

## 2020-12-03 DIAGNOSIS — I10 Essential (primary) hypertension: Secondary | ICD-10-CM

## 2020-12-03 DIAGNOSIS — E785 Hyperlipidemia, unspecified: Secondary | ICD-10-CM

## 2020-12-03 DIAGNOSIS — Z0000 Encounter for general adult medical examination without abnormal findings: Secondary | ICD-10-CM

## 2020-12-03 DIAGNOSIS — E782 Mixed hyperlipidemia: Secondary | ICD-10-CM | POA: Diagnosis not present

## 2020-12-03 NOTE — Progress Notes (Signed)
 Subjective:    Patient ID: Sherry Hart, female    DOB: 07/28/1960, 59 y.o.   MRN: [id number]  HPI Patient is a very pleasant 59-year-old African-American female who is here today for a complete physical exam.  Her diabetes is managed by her endocrinologist, Dr. Kumar.  Her last hemoglobin A1c was checked in April and was acceptable.  She is due for a fasting lipid panel to monitor her cholesterol.  She is also due for a CBC as well as liver function test.  I do not see a urine microalbumin so I would like to draw that as well.  Her blood pressure today is outstanding at 138/72.  Her mammogram is not due until November.  She had a Pap smear performed in June of last year that was normal.  She is not due again until 2024.  Her colonoscopy was performed in 2017 and is not due again until 2027.  She has not yet due for a bone density test until age 64.  Her immunizations are up-to-date as shown below until this fall when she is due for a flu shot.   Immunization record is listed below: Immunization History  Administered Date(s) Administered   Influenza,inj,Quad PF,6+ Mos 03/27/2015, 03/06/2016, 03/15/2017, 02/24/2018, 03/14/2019, 03/21/2020   Influenza-Unspecified 04/12/2014, 03/21/2020   PFIZER(Purple Top)SARS-COV-2 Vaccination 09/07/2019, 05/20/2020, 05/20/2020   Pneumococcal Polysaccharide-23 01/24/2018   Tdap 08/13/2014   Zoster Recombinat (Shingrix) 01/30/2020, 08/01/2020   Past Medical History:  Diagnosis Date   Abdominal pain    Anemia    B12 deficiency    Biliary colic    Constipation    occasionally not chronic per pt. -no medicines for this    Diabetes mellitus 12 yrs ago    Hyperlipidemia    Hypertension    Pernicious anemia    Unexplained weight loss    Vomiting    Weakness    Past Surgical History:  Procedure Laterality Date   CESAREAN SECTION  11/29/1990, 04/06/1994   COLONOSCOPY  09/01/2004   all normal - in epic   KNEE SURGERY  11/2000   right   MOUTH  SURGERY  04/04/1987   UPPER GASTROINTESTINAL ENDOSCOPY  09-01-04   gastric polyp- m.johnson- in epic   WISDOM TOOTH EXTRACTION     Current Outpatient Medications on File Prior to Visit  Medication Sig Dispense Refill   aspirin 81 MG tablet Take 81 mg by mouth daily.     atorvastatin (LIPITOR) 20 MG tablet TAKE 1 TABLET BY MOUTH EVERY DAY 90 tablet 1   Blood Glucose Monitoring Suppl (ONE TOUCH ULTRA 2) W/DEVICE KIT Use to check blood sugar 3 times per day dx code E11.65 1 each 0   Calcium Carbonate-Vit D-Min (CALCIUM 1200 PO) Take by mouth.     cholecalciferol (VITAMIN D3) 25 MCG (1000 UNIT) tablet Take 1,000 Units by mouth daily.     ferrous sulfate 325 (65 FE) MG EC tablet Take 325 mg by mouth daily. Take 1 tablet by mouth daily.     JANUMET XR 100-1000 MG TB24 TAKE 1 TABLET BY MOUTH EVERY DAY 30 tablet 3   JARDIANCE 25 MG TABS tablet TAKE 1/2 TABLET (12.5MG TOTAL) BY MOUTH ONCE DAILY. 45 tablet 1   losartan (COZAAR) 100 MG tablet TAKE 1 TABLET BY MOUTH EVERY DAY 90 tablet 1   OneTouch Delica Lancets 33G MISC USE AS INSTRUCTED 100 each 12   ONETOUCH ULTRA test strip USE AS DIRECTED 100 strip 12   TRULICITY 0.75   MG/0.5ML SOPN INJECT IN THE ABDOMINAL SKIN AS DIRECTED ONCE A WEEK 2 mL 3   vitamin B-12 (CYANOCOBALAMIN) 500 MCG tablet Take 1,000 mcg by mouth daily. Take 1 tablet by mouth daily.     No current facility-administered medications on file prior to visit.   Allergies  Allergen Reactions   Penicillins Hives    Back of neck and upper back.   Social History   Socioeconomic History   Marital status: Married    Spouse name: Not on file   Number of children: Not on file   Years of education: Not on file   Highest education level: Not on file  Occupational History   Not on file  Tobacco Use   Smoking status: Never   Smokeless tobacco: Never  Vaping Use   Vaping Use: Never used  Substance and Sexual Activity   Alcohol use: No    Alcohol/week: 0.0 standard drinks   Drug  use: No   Sexual activity: Not Currently  Other Topics Concern   Not on file  Social History Narrative   Not on file   Social Determinants of Health   Financial Resource Strain: Not on file  Food Insecurity: Not on file  Transportation Needs: Not on file  Physical Activity: Not on file  Stress: Not on file  Social Connections: Not on file  Intimate Partner Violence: Not on file   Family History  Problem Relation Age of Onset   Cancer Mother        breast   Breast cancer Mother 54   Diabetes Father    Thyroid disease Sister    Hypertension Neg Hx    Colon cancer Neg Hx    Colon polyps Neg Hx    Rectal cancer Neg Hx    Stomach cancer Neg Hx       Review of Systems     Objective:   Physical Exam Vitals reviewed.  HENT:     Head: Normocephalic and atraumatic.     Right Ear: External ear normal.     Left Ear: External ear normal.     Nose: Nose normal.     Mouth/Throat:     Pharynx: No oropharyngeal exudate.  Eyes:     General: No scleral icterus.       Right eye: No discharge.        Left eye: No discharge.     Conjunctiva/sclera: Conjunctivae normal.     Pupils: Pupils are equal, round, and reactive to light.  Cardiovascular:     Rate and Rhythm: Normal rate and regular rhythm.     Heart sounds: Normal heart sounds. No murmur heard.   No friction rub. No gallop.  Pulmonary:     Effort: Pulmonary effort is normal. No respiratory distress.     Breath sounds: Normal breath sounds. No stridor. No wheezing or rales.  Chest:     Chest wall: No tenderness.  Abdominal:     General: Bowel sounds are normal. There is no distension.     Palpations: Abdomen is soft. There is no mass.     Tenderness: There is no abdominal tenderness. There is no guarding or rebound.     Hernia: No hernia is present.  Musculoskeletal:        General: No tenderness or deformity. Normal range of motion.     Cervical back: Normal range of motion and neck supple.  Lymphadenopathy:      Cervical: No cervical adenopathy.  Skin:      General: Skin is warm and dry.     Capillary Refill: Capillary refill takes less than 2 seconds.     Coloration: Skin is not pale.     Findings: No erythema or rash.  Neurological:     Mental Status: She is alert and oriented to person, place, and time.     Cranial Nerves: No cranial nerve deficit.     Sensory: No sensory deficit.     Coordination: Coordination normal.     Deep Tendon Reflexes: Reflexes normal.          Assessment & Plan:  Type 2 diabetes mellitus with hyperlipidemia (HCC) - Plan: CBC with Differential/Platelet, COMPLETE METABOLIC PANEL WITH GFR, Lipid panel, Microalbumin, urine  General medical exam  Mixed hyperlipidemia  Essential hypertension, benign Physical exam today is normal.  Immunizations are up-to-date except for the flu shot which is due this fall.  She is due for mammogram in November.  Colonoscopy is due again in 2027.  Pap smear is due in 2024.  Bone density test will be due at age 64.  Immunizations are up-to-date.  I will check CBC CMP lipid panel and urine microalbumin.  Regular anticipatory guidance is provided.  Diabetic foot exam was performed today and was significant only for a hammertoe on her left fourth toe 

## 2020-12-04 ENCOUNTER — Telehealth: Payer: Self-pay | Admitting: *Deleted

## 2020-12-04 LAB — CBC WITH DIFFERENTIAL/PLATELET
Absolute Monocytes: 476 cells/uL (ref 200–950)
Basophils Absolute: 49 cells/uL (ref 0–200)
Basophils Relative: 0.8 %
Eosinophils Absolute: 171 cells/uL (ref 15–500)
Eosinophils Relative: 2.8 %
HCT: 37.4 % (ref 35.0–45.0)
Hemoglobin: 11.9 g/dL (ref 11.7–15.5)
Lymphs Abs: 1440 cells/uL (ref 850–3900)
MCH: 26.6 pg — ABNORMAL LOW (ref 27.0–33.0)
MCHC: 31.8 g/dL — ABNORMAL LOW (ref 32.0–36.0)
MCV: 83.5 fL (ref 80.0–100.0)
MPV: 10.6 fL (ref 7.5–12.5)
Monocytes Relative: 7.8 %
Neutro Abs: 3965 cells/uL (ref 1500–7800)
Neutrophils Relative %: 65 %
Platelets: 285 10*3/uL (ref 140–400)
RBC: 4.48 10*6/uL (ref 3.80–5.10)
RDW: 14.1 % (ref 11.0–15.0)
Total Lymphocyte: 23.6 %
WBC: 6.1 10*3/uL (ref 3.8–10.8)

## 2020-12-04 LAB — MICROALBUMIN, URINE: Microalb, Ur: 0.9 mg/dL

## 2020-12-04 LAB — COMPLETE METABOLIC PANEL WITH GFR
AG Ratio: 1.2 (calc) (ref 1.0–2.5)
ALT: 19 U/L (ref 6–29)
AST: 20 U/L (ref 10–35)
Albumin: 4.2 g/dL (ref 3.6–5.1)
Alkaline phosphatase (APISO): 70 U/L (ref 37–153)
BUN/Creatinine Ratio: 15 (calc) (ref 6–22)
BUN: 17 mg/dL (ref 7–25)
CO2: 29 mmol/L (ref 20–32)
Calcium: 10 mg/dL (ref 8.6–10.4)
Chloride: 102 mmol/L (ref 98–110)
Creat: 1.1 mg/dL — ABNORMAL HIGH (ref 0.50–0.99)
GFR, Est African American: 63 mL/min/{1.73_m2} (ref >=60)
GFR, Est Non African American: 55 mL/min/{1.73_m2} — ABNORMAL LOW (ref >=60)
Globulin: 3.6 g/dL (calc) (ref 1.9–3.7)
Glucose, Bld: 102 mg/dL — ABNORMAL HIGH (ref 65–99)
Potassium: 4.7 mmol/L (ref 3.5–5.3)
Sodium: 139 mmol/L (ref 135–146)
Total Bilirubin: 0.8 mg/dL (ref 0.2–1.2)
Total Protein: 7.8 g/dL (ref 6.1–8.1)

## 2020-12-04 LAB — LIPID PANEL
Cholesterol: 179 mg/dL (ref <200)
HDL: 49 mg/dL — ABNORMAL LOW (ref >=50)
LDL Cholesterol (Calc): 112 mg/dL (calc) — ABNORMAL HIGH
Non-HDL Cholesterol (Calc): 130 mg/dL (calc) — ABNORMAL HIGH (ref <130)
Total CHOL/HDL Ratio: 3.7 (calc) (ref <5.0)
Triglycerides: 84 mg/dL (ref <150)

## 2020-12-04 NOTE — Telephone Encounter (Signed)
Received verbal orders for Cologuard.   Order placed via [facility].   Cologuard (Order [id number])

## 2020-12-19 ENCOUNTER — Telehealth: Payer: Self-pay

## 2020-12-19 NOTE — Telephone Encounter (Signed)
Noted colonoscopy from Dr Richardo Hanks requested repeat screening in 5 years after 2017.  Patient noted sue for colorectal screening.   Discussed Cologuard vs Colonoscopy with patient. Patient agreeable to completing Cologuad.

## 2020-12-24 LAB — COLOGUARD: Cologuard: NEGATIVE

## 2020-12-25 ENCOUNTER — Other Ambulatory Visit: Payer: Self-pay

## 2020-12-25 ENCOUNTER — Encounter: Payer: Self-pay | Admitting: Endocrinology

## 2020-12-25 ENCOUNTER — Other Ambulatory Visit: Payer: BC Managed Care – PPO

## 2020-12-25 DIAGNOSIS — E538 Deficiency of other specified B group vitamins: Secondary | ICD-10-CM

## 2020-12-26 ENCOUNTER — Encounter: Payer: Self-pay | Admitting: *Deleted

## 2020-12-26 LAB — VITAMIN B12: Vitamin B-12: 1074 pg/mL (ref 200–1100)

## 2020-12-29 LAB — COLOGUARD: Cologuard: NEGATIVE

## 2020-12-30 NOTE — Telephone Encounter (Signed)
Received the results of Cologuard screening.   Screening noted negative.   A negative result indicates a low likelihood of colorectal cancer is present. Following a negative Cologuard result, the [employer] recommends a Cologuard re-screening interval of 3 years.   Letter sent.   

## 2021-01-17 ENCOUNTER — Other Ambulatory Visit: Payer: Self-pay | Admitting: Endocrinology

## 2021-01-24 ENCOUNTER — Other Ambulatory Visit (INDEPENDENT_AMBULATORY_CARE_PROVIDER_SITE_OTHER): Payer: BC Managed Care – PPO

## 2021-01-24 ENCOUNTER — Other Ambulatory Visit: Payer: Self-pay

## 2021-01-24 DIAGNOSIS — E7800 Pure hypercholesterolemia, unspecified: Secondary | ICD-10-CM

## 2021-01-24 DIAGNOSIS — E119 Type 2 diabetes mellitus without complications: Secondary | ICD-10-CM

## 2021-01-24 LAB — LIPID PANEL
Cholesterol: 153 mg/dL (ref 0–200)
HDL: 42.9 mg/dL (ref >39.00)
LDL Cholesterol: 91 mg/dL (ref 0–99)
NonHDL: 109.83
Total CHOL/HDL Ratio: 4
Triglycerides: 96 mg/dL (ref 0.0–149.0)
VLDL: 19.2 mg/dL (ref 0.0–40.0)

## 2021-01-24 LAB — COMPREHENSIVE METABOLIC PANEL
ALT: 14 U/L (ref 0–35)
AST: 19 U/L (ref 0–37)
Albumin: 4.3 g/dL (ref 3.5–5.2)
Alkaline Phosphatase: 79 U/L (ref 39–117)
BUN: 16 mg/dL (ref 6–23)
CO2: 28 mEq/L (ref 19–32)
Calcium: 10 mg/dL (ref 8.4–10.5)
Chloride: 103 mEq/L (ref 96–112)
Creatinine, Ser: 1.14 mg/dL (ref 0.40–1.20)
GFR: 52.15 mL/min — ABNORMAL LOW (ref >60.00)
Glucose, Bld: 109 mg/dL — ABNORMAL HIGH (ref 70–99)
Potassium: 5.2 mEq/L — ABNORMAL HIGH (ref 3.5–5.1)
Sodium: 138 mEq/L (ref 135–145)
Total Bilirubin: 0.7 mg/dL (ref 0.2–1.2)
Total Protein: 8.2 g/dL (ref 6.0–8.3)

## 2021-01-24 LAB — HEMOGLOBIN A1C: Hgb A1c MFr Bld: 7 % — ABNORMAL HIGH (ref 4.6–6.5)

## 2021-01-30 ENCOUNTER — Ambulatory Visit: Payer: BC Managed Care – PPO | Admitting: Endocrinology

## 2021-01-30 ENCOUNTER — Encounter: Payer: Self-pay | Admitting: Endocrinology

## 2021-01-30 ENCOUNTER — Other Ambulatory Visit: Payer: Self-pay

## 2021-01-30 VITALS — BP 142/82 | HR 94 | Ht 65.0 in | Wt 149.8 lb

## 2021-01-30 DIAGNOSIS — E875 Hyperkalemia: Secondary | ICD-10-CM

## 2021-01-30 DIAGNOSIS — E1165 Type 2 diabetes mellitus with hyperglycemia: Secondary | ICD-10-CM

## 2021-01-30 DIAGNOSIS — I10 Essential (primary) hypertension: Secondary | ICD-10-CM

## 2021-01-30 NOTE — Progress Notes (Addendum)
Patient ID: Sherry Hart, female   DOB: 1961-02-25, 60 y.o.   MRN: [id number]    Reason for Appointment : Followup  History of Present Illness          Type 2 diabetes mellitus, date of diagnosis: 2002       Past history: She had been treated with metformin since diagnosis and previous records are not available for review. She thinks that when she was taking her blood sugar regularly there were usually below 150 in the morning In 2012 because of lack of insurance she did not take her medications or check her sugar for about a year In 6/13 she was evaluated in the emergency room for abdominal pain and was found to have an A1c of 14% with very high blood sugars. Her only symptoms were weight loss, treated with insulin for short-term. She was seen for initial consultation in 1/15 and at that time her glucose was 431 with A1c of 13.1 Initially treated with insulin but this was later r+eplaced with oral agents She started taking Kombiglyze XR on 08/08/13 and had gone up to 2 tablets daily Because of tendency to relatively higher fasting readings she was given low-dose Amaryl in 3/15 in addition  She has been on SGLT2 drugs, Invokana or Jardiance since 10/2014  She has been on Trulicity 5.36 mg weekly since 12/05/16 when her A1c was 8.3  Recent history:   Non-insulin hypoglycemic drugs: Trulicity 1.44 mg weekly,Janumet XR 100/1000 daily, Jardiance 12.5 mg at dinner  A1c is 7% compared to 6.8   Current blood sugars at home and management: She is again checking her blood sugars very sporadically  Generally tends to have slightly higher readings in the mornings but has only 1 fasting reading recently  This was 136 but she had some fluids at bedtime the night before and her lab glucose later that morning was 109  Otherwise blood sugars have been near normal  She says she is trying to do some walking almost daily now  However has gained 4 pounds since last visit although  recently this has leveled off  Generally trying to eat healthy meals also although asking about types of fruits she can eat  Has been quite regular with her medications and Trulicity  Side effects from medications have been:  GI side effects from 2000 mg metformin ER  Glucose monitoring:  As below    Glucometer:  One Touch ultra 2     Blood Glucose readings from meter download   PRE-MEAL Fasting Lunch Dinner Bedtime Overall  Glucose range: 136  88 97   Mean/median:     107   POST-MEAL PC Breakfast PC Lunch PC Dinner  Glucose range: 113, 119 86   Mean/median:      Previously:  PRE-MEAL Fasting Lunch Dinner Bedtime Overall  Glucose range:  90-118   88, 99    Mean/median:     102   POST-MEAL PC Breakfast PC Lunch PC Dinner  Glucose range:   ?  Mean/median:        Glycemic control:   Lab Results  Component Value Date   HGBA1C 7.0 (H) 01/24/2021   HGBA1C 6.8 (H) 09/23/2020   HGBA1C 6.6 (H) 06/17/2020   Lab Results  Component Value Date   MICROALBUR 0.9 12/03/2020   LDLCALC 91 01/24/2021   CREATININE 1.14 01/24/2021   Self-care: The diet that the patient has been following is:  usually low fat, low carbs, Variable.  Usually has fruit for snacks   Avoiding drinks with sugar, dinner is at 5-7 pm           Dietician visit: Most recent:  01/01/17              Compliance with the medical regimen: fair  Weight history: 125 upto 173, her lowest weight was in 11/2011  Wt Readings from Last 3 Encounters:  01/30/21 149 lb 12.8 oz (67.9 kg)  12/03/20 149 lb (67.6 kg)  09/26/20 145 lb (65.8 kg)   Lab on 01/24/2021  Component Date Value Ref Range Status   Cholesterol 01/24/2021 153  0 - 200 mg/dL Final   ATP III Classification       Desirable:  < 200 mg/dL               Borderline High:  200 - 239 mg/dL          High:  > = 240 mg/dL   Triglycerides 01/24/2021 96.0  0.0 - 149.0 mg/dL Final   Normal:  <150 mg/dLBorderline High:  150 - 199 mg/dL   HDL 01/24/2021 42.90   >39.00 mg/dL Final   VLDL 01/24/2021 19.2  0.0 - 40.0 mg/dL Final   LDL Cholesterol 01/24/2021 91  0 - 99 mg/dL Final   Total CHOL/HDL Ratio 01/24/2021 4   Final                  Men          Women1/2 Average Risk     3.4          3.3Average Risk          5.0          4.42X Average Risk          9.6          7.13X Average Risk          15.0          11.0                       NonHDL 01/24/2021 109.83   Final   NOTE:  Non-HDL goal should be 30 mg/dL higher than patient's LDL goal (i.e. LDL goal of < 70 mg/dL, would have non-HDL goal of < 100 mg/dL)   Sodium 01/24/2021 138  135 - 145 mEq/L Final   Potassium 01/24/2021 5.2 No hemolysis seen (A) 3.5 - 5.1 mEq/L Final   Chloride 01/24/2021 103  96 - 112 mEq/L Final   CO2 01/24/2021 28  19 - 32 mEq/L Final   Glucose, Bld 01/24/2021 109 (A) 70 - 99 mg/dL Final   BUN 01/24/2021 16  6 - 23 mg/dL Final   Creatinine, Ser 01/24/2021 1.14  0.40 - 1.20 mg/dL Final   Total Bilirubin 01/24/2021 0.7  0.2 - 1.2 mg/dL Final   Alkaline Phosphatase 01/24/2021 79  39 - 117 U/L Final   AST 01/24/2021 19  0 - 37 U/L Final   ALT 01/24/2021 14  0 - 35 U/L Final   Total Protein 01/24/2021 8.2  6.0 - 8.3 g/dL Final   Albumin 01/24/2021 4.3  3.5 - 5.2 g/dL Final   GFR 01/24/2021 52.15 (A) >60.00 mL/min Final   Calculated using the CKD-EPI Creatinine Equation (2021)   Calcium 01/24/2021 10.0  8.4 - 10.5 mg/dL Final   Hgb A1c MFr Bld 01/24/2021 7.0 (A) 4.6 - 6.5 % Final   Glycemic Control Guidelines for People  with Diabetes:Non Diabetic:  <6%Goal of Therapy: <7%Additional Action Suggested:  >8%    Other active problems: See review of systems     Allergies as of 01/30/2021       Reactions   Penicillins Hives   Back of neck and upper back.        Medication List        Accurate as of January 30, 2021  4:03 PM. If you have any questions, ask your nurse or doctor.          aspirin 81 MG tablet Take 81 mg by mouth daily.   atorvastatin 20 MG  tablet Commonly known as: LIPITOR TAKE 1 TABLET BY MOUTH EVERY DAY   CALCIUM 1200 PO Take by mouth.   cholecalciferol 25 MCG (1000 UNIT) tablet Commonly known as: VITAMIN D3 Take 1,000 Units by mouth daily.   ferrous sulfate 325 (65 FE) MG EC tablet Take 325 mg by mouth daily. Take 1 tablet by mouth daily.   Janumet XR [phone] MG Tb24 Generic drug: SitaGLIPtin-MetFORMIN HCl TAKE 1 TABLET BY MOUTH EVERY DAY   Jardiance 25 MG Tabs tablet Generic drug: empagliflozin TAKE 1/2 TABLET (12.5MG TOTAL) BY MOUTH ONCE DAILY.   losartan 100 MG tablet Commonly known as: COZAAR TAKE 1 TABLET BY MOUTH EVERY DAY   ONE TOUCH ULTRA 2 w/Device Kit Use to check blood sugar 3 times per day dx code I34.74   OneTouch Delica Lancets 25Z Misc USE AS INSTRUCTED   OneTouch Ultra test strip Generic drug: glucose blood USE AS DIRECTED   Trulicity 5.63 OV/5.6EP Sopn Generic drug: Dulaglutide INJECT IN THE ABDOMINAL SKIN AS DIRECTED ONCE A WEEK   vitamin B-12 500 MCG tablet Commonly known as: CYANOCOBALAMIN Take 1,000 mcg by mouth daily. Take 1 tablet by mouth daily.        Allergies:  Allergies  Allergen Reactions   Penicillins Hives    Back of neck and upper back.    Past Medical History:  Diagnosis Date   Abdominal pain    Anemia    B12 deficiency    Biliary colic    Constipation    occasionally not chronic per pt. -no medicines for this    Diabetes mellitus 12 yrs ago    Hyperlipidemia    Hypertension    Pernicious anemia    Unexplained weight loss    Vomiting    Weakness     Past Surgical History:  Procedure Laterality Date   CESAREAN SECTION  11/29/1990, 04/06/1994   COLONOSCOPY  09/01/2004   all normal - in epic   KNEE SURGERY  11/2000   right   MOUTH SURGERY  04/04/1987   UPPER GASTROINTESTINAL ENDOSCOPY  09-01-04   gastric polyp- m.johnson- in epic   WISDOM TOOTH EXTRACTION      Family History  Problem Relation Age of Onset   Cancer Mother        breast    Breast cancer Mother 85   Diabetes Father    Thyroid disease Sister    Hypertension Neg Hx    Colon cancer Neg Hx    Colon polyps Neg Hx    Rectal cancer Neg Hx    Stomach cancer Neg Hx     Social History:  reports that she has never smoked. She has never used smokeless tobacco. She reports that she does not drink alcohol and does not use drugs.    Review of Systems     HYPERTENSION: blood pressure has been  treated with Cozaar 157m     She has checked readings at home regularly and is recently getting readings around 130-145/ 73-86  Blood pressure may be higher initially on first measurement   BP Readings from Last 3 Encounters:  01/30/21 (!) 142/82  12/03/20 138/72  09/26/20 132/82   RENAL function: As below  Previously had a slightly high creatinine level which improved with reducing Jardiance to 12.5 mg    Lab Results  Component Value Date   CREATININE 1.14 01/24/2021   CREATININE 1.10 (H) 12/03/2020   CREATININE 1.10 09/23/2020    Lab Results  Component Value Date   K 5.2 No hemolysis seen (H) 01/24/2021     Lipids:   She has been taking Lipitor long-term LDL is controlled with the 20 mg dosage    Lab Results  Component Value Date   CHOL 153 01/24/2021   HDL 42.90 01/24/2021   LDLCALC 91 01/24/2021   LDLDIRECT 175.6 07/28/2013   TRIG 96.0 01/24/2021   CHOLHDL 4 01/24/2021     Physical Examination:  BP (!) 142/82   Pulse 94   Ht '5\' 5"'  (1.651 m)   Wt 149 lb 12.8 oz (67.9 kg)   SpO2 98%   BMI 24.93 kg/m      ASSESSMENT/PLAN:   Diabetes type 2, nonobese  See history of present illness for  discussion of  current management, blood sugar patterns and problems identified  Her A1c is 7 compared to 6.8  She is on a regimen of Trulicity, Janumet 1811/9147and 12.5 mg Jardiance  Her A1c is again higher than expected for her actual blood sugars which she is checking very infrequently  She will try to check blood sugars more consistently  at different times and call if consistently high However if her weight and A1c continues to stay relatively high we will increase her Trulicity on the next visit  RENAL dysfunction: This is stable in the normal range Encouraged her to keep up with her fluid intake in summer  HYPERKALEMIA: Her potassium is 5.2 Although this may be partly related to her taking losartan likely can be managed by some changes in her diet especially cutting back on potatoes and bananas Given her list of high potassium foods  Hypertension: Blood pressure is overall well controlled although may have some increase when she first comes in to the office She will continue to monitor at home and let [place]now if it is unusually high  LIPIDS: Excellent with LDL 91 and she will continue same dose of atorvastatin  Follow-up in 4 months     There are no Patient Instructions on file for this visit.    AElayne Snare8/18/2022, 4:03 PM   Note: This office note was prepared with Dragon voice recognition system technology. Any transcriptional errors that result from this process are unintentional.

## 2021-01-30 NOTE — Patient Instructions (Signed)
Check blood sugars on waking up 2-3 days a week  Also check blood sugars about 2 hours after meals and do this after different meals by rotation  Recommended blood sugar levels on waking up are 90-130 and about 2 hours after meal is 130-160  Please bring your blood sugar monitor to each visit, thank you   

## 2021-02-05 ENCOUNTER — Other Ambulatory Visit: Payer: Self-pay | Admitting: Endocrinology

## 2021-03-12 ENCOUNTER — Encounter: Payer: Self-pay | Admitting: Gastroenterology

## 2021-03-23 ENCOUNTER — Other Ambulatory Visit: Payer: Self-pay | Admitting: Endocrinology

## 2021-03-24 ENCOUNTER — Other Ambulatory Visit: Payer: Self-pay | Admitting: Family Medicine

## 2021-03-24 DIAGNOSIS — Z1231 Encounter for screening mammogram for malignant neoplasm of breast: Secondary | ICD-10-CM

## 2021-04-11 ENCOUNTER — Telehealth: Payer: Self-pay | Admitting: Gastroenterology

## 2021-04-11 NOTE — Telephone Encounter (Signed)
Patient received recall letter to schedule colonoscopy.  She did a cologuard test (results in Epic) this year which came back negative.  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  Her questions are: 1. With the negative cologuard test, does she still need to schedule a colonoscopy, and 2. Is she OK to wait until the summer to schedule?  She is having no issues at this time.  Please call and advise.

## 2021-04-14 NOTE — Telephone Encounter (Signed)
Thank you for the note.  (Her negative Cologuard was in July of this year)  Current colon cancer screening guidelines recommend that in this circumstance, she have a recall in 3 years. At that time, she would choose to either have another Cologuard test or directly to colonoscopy, depending on her preference.  I do not know if primary care places colon cancer screening recalls when there is a negative Cologuard, so please put this patient in for a recall with our practice in July 2025.  - HD

## 2021-04-14 NOTE — Telephone Encounter (Signed)
Lm on home vm for patient to return call.  12/2023 recall in epic.

## 2021-04-15 NOTE — Telephone Encounter (Signed)
Left message for pt to call back  °

## 2021-04-16 NOTE — Telephone Encounter (Signed)
Lm on home vm for patient to return call.  

## 2021-04-16 NOTE — Telephone Encounter (Signed)
Patient returned call. Advised of colon not needed till 12/2023. Expressed understanding

## 2021-04-16 NOTE — Telephone Encounter (Signed)
Noted, thanks!

## 2021-04-17 ENCOUNTER — Other Ambulatory Visit: Payer: Self-pay

## 2021-04-17 ENCOUNTER — Other Ambulatory Visit (INDEPENDENT_AMBULATORY_CARE_PROVIDER_SITE_OTHER): Payer: BC Managed Care – PPO

## 2021-04-17 ENCOUNTER — Other Ambulatory Visit: Payer: Self-pay | Admitting: Endocrinology

## 2021-04-17 DIAGNOSIS — E1165 Type 2 diabetes mellitus with hyperglycemia: Secondary | ICD-10-CM

## 2021-04-17 LAB — BASIC METABOLIC PANEL
BUN: 19 mg/dL (ref 6–23)
CO2: 30 mEq/L (ref 19–32)
Calcium: 10.1 mg/dL (ref 8.4–10.5)
Chloride: 101 mEq/L (ref 96–112)
Creatinine, Ser: 1.15 mg/dL (ref 0.40–1.20)
GFR: 51.52 mL/min — ABNORMAL LOW (ref >60.00)
Glucose, Bld: 115 mg/dL — ABNORMAL HIGH (ref 70–99)
Potassium: 4.9 mEq/L (ref 3.5–5.1)
Sodium: 137 mEq/L (ref 135–145)

## 2021-04-17 LAB — HEMOGLOBIN A1C: Hgb A1c MFr Bld: 7 % — ABNORMAL HIGH (ref 4.6–6.5)

## 2021-04-21 ENCOUNTER — Other Ambulatory Visit: Payer: Self-pay

## 2021-04-21 ENCOUNTER — Ambulatory Visit: Payer: BC Managed Care – PPO | Admitting: Endocrinology

## 2021-04-21 ENCOUNTER — Encounter: Payer: Self-pay | Admitting: Endocrinology

## 2021-04-21 VITALS — BP 132/80 | HR 86 | Ht 65.0 in | Wt 147.4 lb

## 2021-04-21 DIAGNOSIS — Z8639 Personal history of other endocrine, nutritional and metabolic disease: Secondary | ICD-10-CM

## 2021-04-21 DIAGNOSIS — I10 Essential (primary) hypertension: Secondary | ICD-10-CM

## 2021-04-21 DIAGNOSIS — Z23 Encounter for immunization: Secondary | ICD-10-CM

## 2021-04-21 DIAGNOSIS — E1165 Type 2 diabetes mellitus with hyperglycemia: Secondary | ICD-10-CM

## 2021-04-21 NOTE — Progress Notes (Signed)
Patient ID: Sherry Hart, female   DOB: 1960/11/24, 60 y.o.   MRN: [id number]    Reason for Appointment : Followup  History of Present Illness          Type 2 diabetes mellitus, date of diagnosis: 2002       Past history: She had been treated with metformin since diagnosis and previous records are not available for review. She thinks that when she was taking her blood sugar regularly there were usually below 150 in the morning In 2012 because of lack of insurance she did not take her medications or check her sugar for about a year In 6/13 she was evaluated in the emergency room for abdominal pain and was found to have an A1c of 14% with very high blood sugars. Her only symptoms were weight loss, treated with insulin for short-term. She was seen for initial consultation in 1/15 and at that time her glucose was 431 with A1c of 13.1 Initially treated with insulin but this was later r+eplaced with oral agents She started taking Kombiglyze XR on 08/08/13 and had gone up to 2 tablets daily Because of tendency to relatively higher fasting readings she was given low-dose Amaryl in 3/15 in addition  She has been on SGLT2 drugs, Invokana or Jardiance since 10/2014  She has been on Trulicity 5.14 mg weekly since 12/05/16 when her A1c was 8.3  Recent history:   Non-insulin hypoglycemic drugs: Trulicity 6.04 mg weekly, Janumet XR 100/1000 daily, Jardiance 12.5 mg at dinner  A1c is 7%, unchanged   Current blood sugars at home and management: She is again having blood sugars near normal with higher A1c than expected  She has done a couple of readings at all different times of the day but not regularly  She may occasionally have slightly higher fasting reading at home but has only 2 or 3 readings; lab fasting glucose was 115  Her strips are not expired but she likely had a falsely low reading of 60, 1 morning without any symptoms her blood sugars very sporadically  Generally  tends to have slightly higher readings in the mornings but has only 1 fasting reading recently  Highest fasting 147, again her blood sugars after dinner are relatively lower She has not done any regular exercise lately because of her schedule, however she thinks she can start going to the gym again  Side effects from medications have been:  GI side effects from 2000 mg metformin ER  Glucose monitoring:  As below    Glucometer:  One Touch ultra 2     Blood Glucose readings from meter download   PRE-MEAL Fasting Lunch Dinner Bedtime Overall  Glucose range: 60-141 98, 138   60-141  Mean/median:     99   POST-MEAL PC Breakfast PC Lunch PC Dinner  Glucose range:   99, 104  Mean/median:      Prior  PRE-MEAL Fasting Lunch Dinner Bedtime Overall  Glucose range: 136  88 97   Mean/median:     107   POST-MEAL PC Breakfast PC Lunch PC Dinner  Glucose range: 113, 119 86   Mean/median:        Glycemic control:   Lab Results  Component Value Date   HGBA1C 7.0 (H) 04/17/2021   HGBA1C 7.0 (H) 01/24/2021   HGBA1C 6.8 (H) 09/23/2020   Lab Results  Component Value Date   MICROALBUR 0.9 12/03/2020   LDLCALC 91 01/24/2021   CREATININE 1.15 04/17/2021  Self-care: The diet that the patient has been following is:  usually low fat, low carbs, Variable.    Usually has fruit for snacks   Avoiding drinks with sugar, dinner is at 5-7 pm           Dietician visit: Most recent:  01/01/17              Compliance with the medical regimen: fair  Weight history: 125 upto 173, her lowest weight was in 11/2011  Wt Readings from Last 3 Encounters:  04/21/21 147 lb 6.4 oz (66.9 kg)  01/30/21 149 lb 12.8 oz (67.9 kg)  12/03/20 149 lb (67.6 kg)   Lab on 04/17/2021  Component Date Value Ref Range Status   Sodium 04/17/2021 137  135 - 145 mEq/L Final   Potassium 04/17/2021 4.9  3.5 - 5.1 mEq/L Final   Chloride 04/17/2021 101  96 - 112 mEq/L Final   CO2 04/17/2021 30  19 - 32 mEq/L Final    Glucose, Bld 04/17/2021 115 (A)  70 - 99 mg/dL Final   BUN 04/17/2021 19  6 - 23 mg/dL Final   Creatinine, Ser 04/17/2021 1.15  0.40 - 1.20 mg/dL Final   GFR 04/17/2021 51.52 (A)  >60.00 mL/min Final   Calculated using the CKD-EPI Creatinine Equation (2021)   Calcium 04/17/2021 10.1  8.4 - 10.5 mg/dL Final   Hgb A1c MFr Bld 04/17/2021 7.0 (A)  4.6 - 6.5 % Final   Glycemic Control Guidelines for People with Diabetes:Non Diabetic:  <6%Goal of Therapy: <7%Additional Action Suggested:  >8%    Other active problems: See review of systems     Allergies as of 04/21/2021       Reactions   Penicillins Hives   Back of neck and upper back.        Medication List        Accurate as of April 21, 2021  4:22 PM. If you have any questions, ask your nurse or doctor.          aspirin 81 MG tablet Take 81 mg by mouth daily.   atorvastatin 20 MG tablet Commonly known as: LIPITOR TAKE 1 TABLET BY MOUTH EVERY DAY   CALCIUM 1200 PO Take by mouth.   cholecalciferol 25 MCG (1000 UNIT) tablet Commonly known as: VITAMIN D3 Take 1,000 Units by mouth daily.   ferrous sulfate 325 (65 FE) MG EC tablet Take 325 mg by mouth daily. Take 1 tablet by mouth daily.   Janumet XR [phone] MG Tb24 Generic drug: SitaGLIPtin-MetFORMIN HCl TAKE 1 TABLET BY MOUTH EVERY DAY   Jardiance 25 MG Tabs tablet Generic drug: empagliflozin TAKE 1/2 TABLET BY MOUTH EVERY DAY   losartan 100 MG tablet Commonly known as: COZAAR TAKE 1 TABLET BY MOUTH EVERY DAY   ONE TOUCH ULTRA 2 w/Device Kit Use to check blood sugar 3 times per day dx code W86.16   OneTouch Delica Lancets 83F Misc USE AS INSTRUCTED   OneTouch Ultra test strip Generic drug: glucose blood USE AS DIRECTED   Trulicity 2.90 SX/1.1BZ Sopn Generic drug: Dulaglutide INJECT IN THE ABDOMINAL SKIN AS DIRECTED ONCE A WEEK   vitamin B-12 500 MCG tablet Commonly known as: CYANOCOBALAMIN Take 1,000 mcg by mouth daily. Take 1 tablet by mouth  daily.        Allergies:  Allergies  Allergen Reactions   Penicillins Hives    Back of neck and upper back.    Past Medical History:  Diagnosis Date  Abdominal pain    Anemia    B12 deficiency    Biliary colic    Constipation    occasionally not chronic per pt. -no medicines for this    Diabetes mellitus 12 yrs ago    Hyperlipidemia    Hypertension    Pernicious anemia    Unexplained weight loss    Vomiting    Weakness     Past Surgical History:  Procedure Laterality Date   CESAREAN SECTION  11/29/1990, 04/06/1994   COLONOSCOPY  09/01/2004   all normal - in epic   KNEE SURGERY  11/2000   right   MOUTH SURGERY  04/04/1987   UPPER GASTROINTESTINAL ENDOSCOPY  09-01-04   gastric polyp- m.johnson- in epic   WISDOM TOOTH EXTRACTION      Family History  Problem Relation Age of Onset   Cancer Mother        breast   Breast cancer Mother 54   Diabetes Father    Thyroid disease Sister    Hypertension Neg Hx    Colon cancer Neg Hx    Colon polyps Neg Hx    Rectal cancer Neg Hx    Stomach cancer Neg Hx     Social History:  reports that she has never smoked. She has never used smokeless tobacco. She reports that she does not drink alcohol and does not use drugs.    Review of Systems     HYPERTENSION: High blood pressure has been treated with Cozaar 135m     She has checked readings at home periodically and is getting readings around 130-145/75-80    BP Readings from Last 3 Encounters:  04/21/21 132/80  01/30/21 (!) 142/82  12/03/20 138/72   RENAL function: As below  Previously had a slightly high creatinine level which improved with reducing Jardiance to 12.5 mg    Lab Results  Component Value Date   CREATININE 1.15 04/17/2021   CREATININE 1.14 01/24/2021   CREATININE 1.10 (H) 12/03/2020   Potassium level is improved and she has been given a low potassium diet  Lab Results  Component Value Date   K 4.9 04/17/2021     Lipids:   She has been  taking Lipitor long-term LDL is controlled with the 20 mg dosage    Lab Results  Component Value Date   CHOL 153 01/24/2021   HDL 42.90 01/24/2021   LDLCALC 91 01/24/2021   LDLDIRECT 175.6 07/28/2013   TRIG 96.0 01/24/2021   CHOLHDL 4 01/24/2021     Physical Examination:  BP 132/80   Pulse 86   Ht '5\' 5"'  (1.651 m)   Wt 147 lb 6.4 oz (66.9 kg)   SpO2 98%   BMI 24.53 kg/m      ASSESSMENT/PLAN:   Diabetes type 2, nonobese  See history of present illness for  discussion of  current management, blood sugar patterns and problems identified  Her A1c is 7 again  She is on a regimen of Trulicity 06.81 Janumet 100/1000 and 12.5 mg Jardiance  Her A1c is usually higher than expected for her actual blood sugars  Only occasionally may have higher fasting readings otherwise daytime readings are quite good  She has maintained her weight and has recently watch her diet well Recently has not done much exercise  For now we will continue the same regimen although may consider increasing Trulicity   HYPERKALEMIA: Her potassium was 5.2 previously and now better with her watching her diet for high sodium foods  Hypertension: Blood pressure is overall well controlled on losartan 100 mg She does monitor at home also   Follow-up in 4 months  Influenza vaccine given  There are no Patient Instructions on file for this visit.    Elayne Snare 04/21/2021, 4:22 PM   Note: This office note was prepared with Dragon voice recognition system technology. Any transcriptional errors that result from this process are unintentional.

## 2021-04-30 ENCOUNTER — Ambulatory Visit
Admission: RE | Admit: 2021-04-30 | Discharge: 2021-04-30 | Disposition: A | Discharge status: Home / Self Care | Payer: BC Managed Care – PPO | Source: Ambulatory Visit | Attending: Family Medicine | Admitting: Family Medicine

## 2021-04-30 ENCOUNTER — Other Ambulatory Visit: Payer: Self-pay

## 2021-04-30 DIAGNOSIS — Z1231 Encounter for screening mammogram for malignant neoplasm of breast: Secondary | ICD-10-CM

## 2021-05-04 DIAGNOSIS — U071 COVID-19: Secondary | ICD-10-CM | POA: Insufficient documentation

## 2021-05-04 DIAGNOSIS — E1165 Type 2 diabetes mellitus with hyperglycemia: Secondary | ICD-10-CM | POA: Insufficient documentation

## 2021-05-22 ENCOUNTER — Other Ambulatory Visit: Payer: Self-pay | Admitting: Endocrinology

## 2021-05-29 ENCOUNTER — Other Ambulatory Visit: Payer: Self-pay | Admitting: Endocrinology

## 2021-06-03 ENCOUNTER — Other Ambulatory Visit: Payer: Self-pay

## 2021-06-03 ENCOUNTER — Ambulatory Visit: Payer: BC Managed Care – PPO | Admitting: Family Medicine

## 2021-07-12 ENCOUNTER — Other Ambulatory Visit: Payer: Self-pay | Admitting: Endocrinology

## 2021-08-18 ENCOUNTER — Other Ambulatory Visit: Payer: Self-pay

## 2021-08-18 ENCOUNTER — Other Ambulatory Visit (INDEPENDENT_AMBULATORY_CARE_PROVIDER_SITE_OTHER): Payer: BC Managed Care – PPO

## 2021-08-18 DIAGNOSIS — E1165 Type 2 diabetes mellitus with hyperglycemia: Secondary | ICD-10-CM

## 2021-08-18 LAB — COMPREHENSIVE METABOLIC PANEL
ALT: 38 U/L — ABNORMAL HIGH (ref 0–35)
AST: 35 U/L (ref 0–37)
Albumin: 4.5 g/dL (ref 3.5–5.2)
Alkaline Phosphatase: 77 U/L (ref 39–117)
BUN: 19 mg/dL (ref 6–23)
CO2: 29 mEq/L (ref 19–32)
Calcium: 10.3 mg/dL (ref 8.4–10.5)
Chloride: 100 mEq/L (ref 96–112)
Creatinine, Ser: 1.29 mg/dL — ABNORMAL HIGH (ref 0.40–1.20)
GFR: 44.78 mL/min — ABNORMAL LOW (ref >60.00)
Glucose, Bld: 101 mg/dL — ABNORMAL HIGH (ref 70–99)
Potassium: 5 mEq/L (ref 3.5–5.1)
Sodium: 138 mEq/L (ref 135–145)
Total Bilirubin: 1 mg/dL (ref 0.2–1.2)
Total Protein: 8.2 g/dL (ref 6.0–8.3)

## 2021-08-18 LAB — HEMOGLOBIN A1C: Hgb A1c MFr Bld: 7 % — ABNORMAL HIGH (ref 4.6–6.5)

## 2021-08-21 ENCOUNTER — Encounter: Payer: Self-pay | Admitting: Endocrinology

## 2021-08-21 ENCOUNTER — Ambulatory Visit: Payer: BC Managed Care – PPO | Admitting: Endocrinology

## 2021-08-21 ENCOUNTER — Other Ambulatory Visit: Payer: Self-pay

## 2021-08-21 VITALS — BP 144/72 | HR 88 | Ht 65.0 in | Wt 147.4 lb

## 2021-08-21 DIAGNOSIS — E7800 Pure hypercholesterolemia, unspecified: Secondary | ICD-10-CM | POA: Diagnosis not present

## 2021-08-21 DIAGNOSIS — I10 Essential (primary) hypertension: Secondary | ICD-10-CM | POA: Diagnosis not present

## 2021-08-21 DIAGNOSIS — E119 Type 2 diabetes mellitus without complications: Secondary | ICD-10-CM | POA: Diagnosis not present

## 2021-08-21 NOTE — Progress Notes (Signed)
Patient ID: Sherry Hart, female   DOB: [birth year], 61 y.o.   MRN: [id number]    Reason for Appointment : Followup  History of Present Illness          Type 2 diabetes mellitus, date of diagnosis: 2002       Past history: She had been treated with metformin since diagnosis and previous records are not available for review. She thinks that when she was taking her blood sugar regularly there were usually below 150 in the morning In 2012 because of lack of insurance she did not take her medications or check her sugar for about a year In 6/13 she was evaluated in the emergency room for abdominal pain and was found to have an A1c of 14% with very high blood sugars. Her only symptoms were weight loss, treated with insulin for short-term. She was seen for initial consultation in 1/15 and at that time her glucose was 431 with A1c of 13.1 Initially treated with insulin but this was later r+eplaced with oral agents She started taking Kombiglyze XR on 08/08/13 and had gone up to 2 tablets daily Because of tendency to relatively higher fasting readings she was given low-dose Amaryl in 3/15 in addition  She has been on SGLT2 drugs, Invokana or Jardiance since 10/2014  She has been on Trulicity 8.75 mg weekly since 12/05/16 when her A1c was 8.3  Recent history:   Non-insulin hypoglycemic drugs: Trulicity 7.97 mg weekly, Janumet XR 100/1000 at dinner, Jardiance 12.5 mg at dinner  A1c is 7%, unchanged   Current blood sugars at home and management: She has not checked her blood sugars much and apparently her test trips expired recently  Her fasting lab glucose was 101  Since her last visit and especially in the last couple months she has gone 3-4 times to the gym to exercise usually after work  She is also usually trying to eat small portions and if she is eating more at lunchtime she may even skip dinner  Only rarely may feel a little hypoglycemic in the afternoon if late for  lunch No side effects from Trulicity or Jardiance Occasionally however in the last 2 to 3 months he has periodically had more sweets   Side effects from medications have been:  GI side effects from 2000 mg metformin ER  Glucose monitoring:  As below    Glucometer:  One Touch ultra 2     Blood Glucose readings not available  Previously from meter download   PRE-MEAL Fasting Lunch Dinner Bedtime Overall  Glucose range: 60-141 98, 138   60-141  Mean/median:     99   POST-MEAL PC Breakfast PC Lunch PC Dinner  Glucose range:   99, 104  Mean/median:      Wt Readings from Last 3 Encounters:  08/21/21 147 lb 6.4 oz (66.9 kg)  04/21/21 147 lb 6.4 oz (66.9 kg)  01/30/21 149 lb 12.8 oz (67.9 kg)     Glycemic control:   Lab Results  Component Value Date   HGBA1C 7.0 (H) 08/18/2021   HGBA1C 7.0 (H) 04/17/2021   HGBA1C 7.0 (H) 01/24/2021   Lab Results  Component Value Date   MICROALBUR 0.9 12/03/2020   LDLCALC 91 01/24/2021   CREATININE 1.29 (H) 08/18/2021   Self-care: The diet that the patient has been following is:  usually low fat, low carbs, Variable.    Usually has fruit for snacks   Avoiding drinks with sugar, dinner is at  5-7 pm           Dietician visit: Most recent:  01/01/17              Compliance with the medical regimen: fair  Weight history: 125 upto 173, her lowest weight was in 11/2011   Lab on 08/18/2021  Component Date Value Ref Range Status   Sodium 08/18/2021 138  135 - 145 mEq/L Final   Potassium 08/18/2021 5.0  3.5 - 5.1 mEq/L Final   Chloride 08/18/2021 100  96 - 112 mEq/L Final   CO2 08/18/2021 29  19 - 32 mEq/L Final   Glucose, Bld 08/18/2021 101 (H)  70 - 99 mg/dL Final   BUN 08/18/2021 19  6 - 23 mg/dL Final   Creatinine, Ser 08/18/2021 1.29 (H)  0.40 - 1.20 mg/dL Final   Total Bilirubin 08/18/2021 1.0  0.2 - 1.2 mg/dL Final   Alkaline Phosphatase 08/18/2021 77  39 - 117 U/L Final   AST 08/18/2021 35  0 - 37 U/L Final   ALT 08/18/2021 38  (H)  0 - 35 U/L Final   Total Protein 08/18/2021 8.2  6.0 - 8.3 g/dL Final   Albumin 08/18/2021 4.5  3.5 - 5.2 g/dL Final   GFR 08/18/2021 44.78 (L)  >60.00 mL/min Final   Calculated using the CKD-EPI Creatinine Equation (2021)   Calcium 08/18/2021 10.3  8.4 - 10.5 mg/dL Final   Hgb A1c MFr Bld 08/18/2021 7.0 (H)  4.6 - 6.5 % Final   Glycemic Control Guidelines for People with Diabetes:Non Diabetic:  <6%Goal of Therapy: <7%Additional Action Suggested:  >8%    Other active problems: See review of systems     Allergies as of 08/21/2021       Reactions   Penicillins Hives   Back of neck and upper back.        Medication List        Accurate as of August 21, 2021  3:37 PM. If you have any questions, ask your nurse or doctor.          aspirin 81 MG tablet Take 81 mg by mouth daily.   atorvastatin 20 MG tablet Commonly known as: LIPITOR TAKE 1 TABLET BY MOUTH EVERY DAY   CALCIUM 1200 PO Take by mouth.   cholecalciferol 25 MCG (1000 UNIT) tablet Commonly known as: VITAMIN D3 Take 1,000 Units by mouth daily.   ferrous sulfate 325 (65 FE) MG EC tablet Take 325 mg by mouth daily. Take 1 tablet by mouth daily.   Janumet XR [phone] MG Tb24 Generic drug: SitaGLIPtin-MetFORMIN HCl TAKE 1 TABLET BY MOUTH EVERY DAY   Jardiance 25 MG Tabs tablet Generic drug: empagliflozin TAKE 1/2 TABLET BY MOUTH EVERY DAY   losartan 100 MG tablet Commonly known as: COZAAR TAKE 1 TABLET BY MOUTH EVERY DAY   ONE TOUCH ULTRA 2 w/Device Kit Use to check blood sugar 3 times per day dx code Q67.61   OneTouch Delica Lancets 95K Misc USE AS INSTRUCTED   OneTouch Ultra test strip Generic drug: glucose blood USE AS DIRECTED   Trulicity 9.32 IZ/1.2WP Sopn Generic drug: Dulaglutide INJECT IN THE ABDOMINAL SKIN AS DIRECTED ONCE A WEEK   vitamin B-12 500 MCG tablet Commonly known as: CYANOCOBALAMIN Take 1,000 mcg by mouth daily. Take 1 tablet by mouth daily.        Allergies:   Allergies  Allergen Reactions   Penicillins Hives    Back of neck and upper back.  Past Medical History:  Diagnosis Date   Abdominal pain    Anemia    B12 deficiency    Biliary colic    Constipation    occasionally not chronic per pt. -no medicines for this    Diabetes mellitus 12 yrs ago    Hyperlipidemia    Hypertension    Pernicious anemia    Unexplained weight loss    Vomiting    Weakness     Past Surgical History:  Procedure Laterality Date   CESAREAN SECTION  11/29/1990, 04/06/1994   COLONOSCOPY  09/01/2004   all normal - in epic   KNEE SURGERY  11/2000   right   MOUTH SURGERY  04/04/1987   UPPER GASTROINTESTINAL ENDOSCOPY  09-01-04   gastric polyp- m.johnson- in epic   WISDOM TOOTH EXTRACTION      Family History  Problem Relation Age of Onset   Cancer Mother        breast   Breast cancer Mother 39   Diabetes Father    Thyroid disease Sister    Hypertension Neg Hx    Colon cancer Neg Hx    Colon polyps Neg Hx    Rectal cancer Neg Hx    Stomach cancer Neg Hx     Social History:  reports that she has never smoked. She has never used smokeless tobacco. She reports that she does not drink alcohol and does not use drugs.    Review of Systems     HYPERTENSION: High blood pressure has been treated with Cozaar 164m     She has checked readings at home periodically  Recent readings around 130-145/75-80  BP Readings from Last 3 Encounters:  08/21/21 (!) 144/72  04/21/21 132/80  01/30/21 (!) 142/82   RENAL function: As below  Creatinine slightly higher   Lab Results  Component Value Date   CREATININE 1.29 (H) 08/18/2021   CREATININE 1.15 04/17/2021   CREATININE 1.14 01/24/2021   Potassium level is upper normal and she has been given a low potassium diet  Lab Results  Component Value Date   K 5.0 08/18/2021     Lipids:   She has been taking Lipitor long-term LDL is controlled with the 20 mg dosage  Lab Results  Component Value Date    CHOL 153 01/24/2021   HDL 42.90 01/24/2021   LDLCALC 91 01/24/2021   LDLDIRECT 175.6 07/28/2013   TRIG 96.0 01/24/2021   CHOLHDL 4 01/24/2021     Physical Examination:  BP (!) 144/72    Pulse 88    Ht '5\' 5"'  (1.651 m)    Wt 147 lb 6.4 oz (66.9 kg)    SpO2 98%    BMI 24.53 kg/m      ASSESSMENT/PLAN:   Diabetes type 2, nonobese  See history of present illness for  discussion of  current management, blood sugar patterns and problems identified  Her A1c is 7 again  She is on a regimen of Trulicity 03.78 Janumet 100/1000 and 12.5 mg Jardiance  Her A1c is usually higher than expected for her actual blood sugars  Lab fasting glucose 101 Has not checked readings above at home  However overall she is trying to do well with regular exercise at the gym Most of the time she is doing well with her diet also  Unless she has tendency to high readings at times at home will not change her Trulicity    HYPERKALEMIA: Her potassium is still upper normal and she can continue  some restriction of high potassium foods  Hypertension: Blood pressure is generally well controlled on losartan 100 mg Usually not having high readings at home   Follow-up in 4 months    There are no Patient Instructions on file for this visit.    Elayne Snare 08/21/2021, 3:37 PM   Note: This office note was prepared with Dragon voice recognition system technology. Any transcriptional errors that result from this process are unintentional.

## 2021-08-21 NOTE — Patient Instructions (Addendum)
Check on 90 day mail order ? ?[email or web address] for copay card ? ?Check blood sugars on waking up 2-3 days a week ? ?Also check blood sugars about 2 hours after meals and do this after different meals by rotation ? ?Recommended blood sugar levels on waking up are 90-120 and about 2 hours after meal is 130-160 ? ?Please bring your blood sugar monitor to each visit, thank you ? ?Increase fluids ? ?

## 2021-08-22 ENCOUNTER — Other Ambulatory Visit: Payer: Self-pay | Admitting: Endocrinology

## 2021-08-22 DIAGNOSIS — E1165 Type 2 diabetes mellitus with hyperglycemia: Secondary | ICD-10-CM

## 2021-09-24 ENCOUNTER — Other Ambulatory Visit: Payer: Self-pay | Admitting: Endocrinology

## 2021-10-08 ENCOUNTER — Other Ambulatory Visit: Payer: Self-pay | Admitting: Endocrinology

## 2021-10-10 ENCOUNTER — Other Ambulatory Visit: Payer: Self-pay | Admitting: Endocrinology

## 2021-11-14 ENCOUNTER — Other Ambulatory Visit: Payer: Self-pay | Admitting: Endocrinology

## 2021-11-24 LAB — HM DIABETES EYE EXAM

## 2021-11-30 ENCOUNTER — Encounter: Payer: Self-pay | Admitting: Endocrinology

## 2021-12-22 ENCOUNTER — Other Ambulatory Visit (INDEPENDENT_AMBULATORY_CARE_PROVIDER_SITE_OTHER): Payer: BC Managed Care – PPO

## 2021-12-22 DIAGNOSIS — E119 Type 2 diabetes mellitus without complications: Secondary | ICD-10-CM | POA: Diagnosis not present

## 2021-12-22 DIAGNOSIS — E7800 Pure hypercholesterolemia, unspecified: Secondary | ICD-10-CM | POA: Diagnosis not present

## 2021-12-22 LAB — MICROALBUMIN / CREATININE URINE RATIO
Creatinine,U: 81 mg/dL
Microalb Creat Ratio: 1.5 mg/g (ref 0.0–30.0)
Microalb, Ur: 1.2 mg/dL (ref 0.0–1.9)

## 2021-12-22 LAB — COMPREHENSIVE METABOLIC PANEL
ALT: 18 U/L (ref 0–35)
AST: 20 U/L (ref 0–37)
Albumin: 4.4 g/dL (ref 3.5–5.2)
Alkaline Phosphatase: 77 U/L (ref 39–117)
BUN: 23 mg/dL (ref 6–23)
CO2: 28 mEq/L (ref 19–32)
Calcium: 10.2 mg/dL (ref 8.4–10.5)
Chloride: 103 mEq/L (ref 96–112)
Creatinine, Ser: 1.29 mg/dL — ABNORMAL HIGH (ref 0.40–1.20)
GFR: 44.67 mL/min — ABNORMAL LOW (ref >60.00)
Glucose, Bld: 111 mg/dL — ABNORMAL HIGH (ref 70–99)
Potassium: 4.2 mEq/L (ref 3.5–5.1)
Sodium: 140 mEq/L (ref 135–145)
Total Bilirubin: 0.5 mg/dL (ref 0.2–1.2)
Total Protein: 8 g/dL (ref 6.0–8.3)

## 2021-12-22 LAB — LIPID PANEL
Cholesterol: 171 mg/dL (ref 0–200)
HDL: 45 mg/dL (ref >39.00)
LDL Cholesterol: 108 mg/dL — ABNORMAL HIGH (ref 0–99)
NonHDL: 125.95
Total CHOL/HDL Ratio: 4
Triglycerides: 88 mg/dL (ref 0.0–149.0)
VLDL: 17.6 mg/dL (ref 0.0–40.0)

## 2021-12-22 LAB — HEMOGLOBIN A1C: Hgb A1c MFr Bld: 6.9 % — ABNORMAL HIGH (ref 4.6–6.5)

## 2021-12-25 ENCOUNTER — Ambulatory Visit: Payer: BC Managed Care – PPO | Admitting: Endocrinology

## 2021-12-25 ENCOUNTER — Encounter: Payer: Self-pay | Admitting: Endocrinology

## 2021-12-25 VITALS — BP 132/78 | HR 82 | Ht 65.0 in | Wt 146.4 lb

## 2021-12-25 DIAGNOSIS — E7800 Pure hypercholesterolemia, unspecified: Secondary | ICD-10-CM

## 2021-12-25 DIAGNOSIS — E119 Type 2 diabetes mellitus without complications: Secondary | ICD-10-CM

## 2021-12-25 DIAGNOSIS — I10 Essential (primary) hypertension: Secondary | ICD-10-CM | POA: Diagnosis not present

## 2021-12-25 NOTE — Progress Notes (Addendum)
Patient ID: Sherry Hart, female   DOB: [birth year], 61 y.o.   MRN: [id number]    Reason for Appointment : Followup  History of Present Illness          Type 2 diabetes mellitus, date of diagnosis: 2002       Past history: She had been treated with metformin since diagnosis and previous records are not available for review. She thinks that when she was taking her blood sugar regularly there were usually below 150 in the morning In 2012 because of lack of insurance she did not take her medications or check her sugar for about a year In 6/13 she was evaluated in the emergency room for abdominal pain and was found to have an A1c of 14% with very high blood sugars. Her only symptoms were weight loss, treated with insulin for short-term. She was seen for initial consultation in 1/15 and at that time her glucose was 431 with A1c of 13.1 Initially treated with insulin but this was later r+eplaced with oral agents She started taking Kombiglyze XR on 08/08/13 and had gone up to 2 tablets daily Because of tendency to relatively higher fasting readings she was given low-dose Amaryl in 3/15 in addition  She has been on SGLT2 drugs, Invokana or Jardiance since 10/2014  She has been on Trulicity 3.61 mg weekly since 12/05/16 when her A1c was 8.3  Recent history:   Non-insulin hypoglycemic drugs: Trulicity 2.24 mg weekly, Janumet XR 100/1000 at dinner, Jardiance 12.5 mg at dinner  A1c is 6.9 compared to 7 %, lowest previously 6.4   Current blood sugars at home and management: She has maintained her weight She is also exercising regularly for 5 days a week at the gym  She is avoiding high carbohydrate foods like potatoes but may be occasionally getting higher fat foods  Blood sugars do not look higher at home  Although she has done well with Trulicity she thinks that she may have occasional nausea or bloating  Side effects from medications have been:  GI side effects from 2000  mg metformin ER  Glucose monitoring:  As below    Glucometer:  One Touch ultra 2     Blood Glucose readings by recall:  Highest fasting about 126 and nonfasting 148   Wt Readings from Last 3 Encounters:  12/25/21 146 lb 6.4 oz (66.4 kg)  08/21/21 147 lb 6.4 oz (66.9 kg)  04/21/21 147 lb 6.4 oz (66.9 kg)     Glycemic control:   Lab Results  Component Value Date   HGBA1C 6.9 (H) 12/22/2021   HGBA1C 7.0 (H) 08/18/2021   HGBA1C 7.0 (H) 04/17/2021   Lab Results  Component Value Date   MICROALBUR 1.2 12/22/2021   LDLCALC 108 (H) 12/22/2021   CREATININE 1.29 (H) 12/22/2021   Self-care: The diet that the patient has been following is:  usually low fat, low carbs, Variable.    Usually has fruit for snacks   Avoiding drinks with sugar, dinner is at 5-7 pm           Dietician visit: Most recent:  01/01/17              Compliance with the medical regimen: fair  Weight history: 125 upto 173, her lowest weight was in 11/2011   Lab on 12/22/2021  Component Date Value Ref Range Status   Cholesterol 12/22/2021 171  0 - 200 mg/dL Final   ATP III Classification  Desirable:  < 200 mg/dL               Borderline High:  200 - 239 mg/dL          High:  > = 240 mg/dL   Triglycerides 12/22/2021 88.0  0.0 - 149.0 mg/dL Final   Normal:  <150 mg/dLBorderline High:  150 - 199 mg/dL   HDL 12/22/2021 45.00  >39.00 mg/dL Final   VLDL 12/22/2021 17.6  0.0 - 40.0 mg/dL Final   LDL Cholesterol 12/22/2021 108 (H)  0 - 99 mg/dL Final   Total CHOL/HDL Ratio 12/22/2021 4   Final                  Men          Women1/2 Average Risk     3.4          3.3Average Risk          5.0          4.42X Average Risk          9.6          7.13X Average Risk          15.0          11.0                       NonHDL 12/22/2021 125.95   Final   NOTE:  Non-HDL goal should be 30 mg/dL higher than patient's LDL goal (i.e. LDL goal of < 70 mg/dL, would have non-HDL goal of < 100 mg/dL)   Microalb, Ur 12/22/2021 1.2  0.0  - 1.9 mg/dL Final   Creatinine,U 12/22/2021 81.0  mg/dL Final   Microalb Creat Ratio 12/22/2021 1.5  0.0 - 30.0 mg/g Final   Sodium 12/22/2021 140  135 - 145 mEq/L Final   Potassium 12/22/2021 4.2  3.5 - 5.1 mEq/L Final   Chloride 12/22/2021 103  96 - 112 mEq/L Final   CO2 12/22/2021 28  19 - 32 mEq/L Final   Glucose, Bld 12/22/2021 111 (H)  70 - 99 mg/dL Final   BUN 12/22/2021 23  6 - 23 mg/dL Final   Creatinine, Ser 12/22/2021 1.29 (H)  0.40 - 1.20 mg/dL Final   Total Bilirubin 12/22/2021 0.5  0.2 - 1.2 mg/dL Final   Alkaline Phosphatase 12/22/2021 77  39 - 117 U/L Final   AST 12/22/2021 20  0 - 37 U/L Final   ALT 12/22/2021 18  0 - 35 U/L Final   Total Protein 12/22/2021 8.0  6.0 - 8.3 g/dL Final   Albumin 12/22/2021 4.4  3.5 - 5.2 g/dL Final   GFR 12/22/2021 44.67 (L)  >60.00 mL/min Final   Calculated using the CKD-EPI Creatinine Equation (2021)   Calcium 12/22/2021 10.2  8.4 - 10.5 mg/dL Final   Hgb A1c MFr Bld 12/22/2021 6.9 (H)  4.6 - 6.5 % Final   Glycemic Control Guidelines for People with Diabetes:Non Diabetic:  <6%Goal of Therapy: <7%Additional Action Suggested:  >8%    Other active problems: See review of systems     Allergies as of 12/25/2021       Reactions   Penicillins Hives   Back of neck and upper back.        Medication List        Accurate as of December 25, 2021  9:17 AM. If you have any questions, ask your nurse or doctor.  aspirin 81 MG tablet Take 81 mg by mouth daily.   atorvastatin 20 MG tablet Commonly known as: LIPITOR TAKE 1 TABLET BY MOUTH EVERY DAY   CALCIUM 1200 PO Take by mouth.   cholecalciferol 25 MCG (1000 UNIT) tablet Commonly known as: VITAMIN D3 Take 1,000 Units by mouth daily.   ferrous sulfate 325 (65 FE) MG EC tablet Take 325 mg by mouth daily. Take 1 tablet by mouth daily.   Janumet XR [phone] MG Tb24 Generic drug: SitaGLIPtin-MetFORMIN HCl TAKE 1 TABLET BY MOUTH EVERY DAY   Jardiance 25 MG Tabs  tablet Generic drug: empagliflozin TAKE 1/2 TABLET BY MOUTH EVERY DAY   losartan 100 MG tablet Commonly known as: COZAAR TAKE 1 TABLET BY MOUTH EVERY DAY   ONE TOUCH ULTRA 2 w/Device Kit Use to check blood sugar 3 times per day dx code J68.11   OneTouch Delica Lancets 57W Misc USE AS INSTRUCTED   OneTouch Ultra test strip Generic drug: glucose blood USE AS DIRECTED   Trulicity 6.20 BT/5.9RC Sopn Generic drug: Dulaglutide INJECT IN THE ABDOMINAL SKIN AS DIRECTED ONCE A WEEK   vitamin B-12 500 MCG tablet Commonly known as: CYANOCOBALAMIN Take 1,000 mcg by mouth daily. Take 1 tablet by mouth daily.        Allergies:  Allergies  Allergen Reactions   Penicillins Hives    Back of neck and upper back.    Past Medical History:  Diagnosis Date   Abdominal pain    Anemia    B12 deficiency    Biliary colic    Constipation    occasionally not chronic per pt. -no medicines for this    Diabetes mellitus 12 yrs ago    Hyperlipidemia    Hypertension    Pernicious anemia    Unexplained weight loss    Vomiting    Weakness     Past Surgical History:  Procedure Laterality Date   CESAREAN SECTION  11/29/1990, 04/06/1994   COLONOSCOPY  09/01/2004   all normal - in epic   KNEE SURGERY  11/2000   right   MOUTH SURGERY  04/04/1987   UPPER GASTROINTESTINAL ENDOSCOPY  09-01-04   gastric polyp- m.johnson- in epic   WISDOM TOOTH EXTRACTION      Family History  Problem Relation Age of Onset   Cancer Mother        breast   Breast cancer Mother 81   Diabetes Father    Thyroid disease Sister    Hypertension Neg Hx    Colon cancer Neg Hx    Colon polyps Neg Hx    Rectal cancer Neg Hx    Stomach cancer Neg Hx     Social History:  reports that she has never smoked. She has never used smokeless tobacco. She reports that she does not drink alcohol and does not use drugs.    Review of Systems     HYPERTENSION: High blood pressure has been treated with Cozaar 122m     She  has checked readings at home periodically  No high readings at home  BP Readings from Last 3 Encounters:  12/25/21 132/78  08/21/21 (!) 144/72  04/21/21 132/80   RENAL function: As below  Creatinine levels:  Lab Results  Component Value Date   CREATININE 1.29 (H) 12/22/2021   CREATININE 1.29 (H) 08/18/2021   CREATININE 1.15 04/17/2021   Potassium level is upper normal and she has been given a low potassium diet  Lab Results  Component Value Date  K 4.2 12/22/2021     Lipids:   She has been taking Lipitor long-term LDL is previously controlled with the 20 mg dosage, unclear why it is higher as she does not think she has missed any doses  Lab Results  Component Value Date   CHOL 171 12/22/2021   HDL 45.00 12/22/2021   LDLCALC 108 (H) 12/22/2021   LDLDIRECT 175.6 07/28/2013   TRIG 88.0 12/22/2021   CHOLHDL 4 12/22/2021     Physical Examination:  BP 132/78   Pulse 82   Ht '5\' 5"'  (1.651 m)   Wt 146 lb 6.4 oz (66.4 kg)   SpO2 99%   BMI 24.36 kg/m    Diabetic Foot Exam - Simple   Simple Foot Form Diabetic Foot exam was performed with the following findings: Yes   Visual Inspection No deformities, no ulcerations, no other skin breakdown bilaterally: Yes Sensation Testing Intact to touch and monofilament testing bilaterally: Yes Pulse Check Posterior Tibialis and Dorsalis pulse intact bilaterally: Yes Comments      ASSESSMENT/PLAN:   Diabetes type 2, nonobese  See history of present illness for  discussion of  current management, blood sugar patterns and problems identified  Her A1c is 6.9  She is on a regimen of Trulicity 8.11, Janumet 100/1000 and 12.5 mg Jardiance  Her A1c is usually higher than expected for her actual blood sugars  Lab fasting glucose 111  Occasionally may be having higher readings at home but she did not bring her monitor for download  However overall she is trying to do well with regular exercise at the gym She may be  able to do a little better with diet with cutting back some high fat foods However since her blood sugars are not consistently high we will not change her medication, she is also reluctant to increase her Trulicity  HYPERKALEMIA: Her potassium is still normal and she can continue restriction of high potassium foods  Hypertension: Blood pressure is generally well controlled on losartan 100 mg She will also monitor at home  Hypercholesterolemia: LDL is above 100, likely needs higher dose of Lipitor but she is reluctant to do this Given her list of high saturated fat foods and she will cut back on meats such as hot dogs and bacon  Follow-up in 4 months    There are no Patient Instructions on file for this visit.    Elayne Snare 12/25/2021, 9:17 AM   Note: This office note was prepared with Dragon voice recognition system technology. Any transcriptional errors that result from this process are unintentional.

## 2021-12-25 NOTE — Patient Instructions (Signed)
Check blood sugars on waking up 2 or 3 days a week  Also check blood sugars about 2 hours after meals and do this after different meals by rotation  Recommended blood sugar levels on waking up are 90-130 and about 2 hours after meal is 130-160  Please bring your blood sugar monitor to each visit, thank you

## 2022-01-09 ENCOUNTER — Other Ambulatory Visit: Payer: Self-pay | Admitting: Endocrinology

## 2022-01-12 ENCOUNTER — Other Ambulatory Visit: Payer: Self-pay | Admitting: Endocrinology

## 2022-02-09 ENCOUNTER — Other Ambulatory Visit: Payer: Self-pay | Admitting: Endocrinology

## 2022-03-12 ENCOUNTER — Other Ambulatory Visit: Payer: Self-pay | Admitting: Endocrinology

## 2022-03-23 ENCOUNTER — Other Ambulatory Visit: Payer: Self-pay | Admitting: Family Medicine

## 2022-03-23 DIAGNOSIS — Z1231 Encounter for screening mammogram for malignant neoplasm of breast: Secondary | ICD-10-CM

## 2022-04-10 ENCOUNTER — Other Ambulatory Visit: Payer: Self-pay | Admitting: Endocrinology

## 2022-04-17 ENCOUNTER — Other Ambulatory Visit (INDEPENDENT_AMBULATORY_CARE_PROVIDER_SITE_OTHER): Payer: BC Managed Care – PPO

## 2022-04-17 DIAGNOSIS — E7800 Pure hypercholesterolemia, unspecified: Secondary | ICD-10-CM | POA: Diagnosis not present

## 2022-04-17 DIAGNOSIS — E119 Type 2 diabetes mellitus without complications: Secondary | ICD-10-CM | POA: Diagnosis not present

## 2022-04-17 LAB — COMPREHENSIVE METABOLIC PANEL
ALT: 12 U/L (ref 0–35)
AST: 20 U/L (ref 0–37)
Albumin: 4.2 g/dL (ref 3.5–5.2)
Alkaline Phosphatase: 59 U/L (ref 39–117)
BUN: 15 mg/dL (ref 6–23)
CO2: 29 mEq/L (ref 19–32)
Calcium: 9.9 mg/dL (ref 8.4–10.5)
Chloride: 102 mEq/L (ref 96–112)
Creatinine, Ser: 1.15 mg/dL (ref 0.40–1.20)
GFR: 51.16 mL/min — ABNORMAL LOW (ref >60.00)
Glucose, Bld: 92 mg/dL (ref 70–99)
Potassium: 4.8 mEq/L (ref 3.5–5.1)
Sodium: 139 mEq/L (ref 135–145)
Total Bilirubin: 0.8 mg/dL (ref 0.2–1.2)
Total Protein: 7.9 g/dL (ref 6.0–8.3)

## 2022-04-17 LAB — LIPID PANEL
Cholesterol: 151 mg/dL (ref 0–200)
HDL: 45.6 mg/dL (ref >39.00)
LDL Cholesterol: 92 mg/dL (ref 0–99)
NonHDL: 105.44
Total CHOL/HDL Ratio: 3
Triglycerides: 68 mg/dL (ref 0.0–149.0)
VLDL: 13.6 mg/dL (ref 0.0–40.0)

## 2022-04-17 LAB — HEMOGLOBIN A1C: Hgb A1c MFr Bld: 6.6 % — ABNORMAL HIGH (ref 4.6–6.5)

## 2022-04-20 ENCOUNTER — Ambulatory Visit: Payer: BC Managed Care – PPO | Admitting: Endocrinology

## 2022-05-04 ENCOUNTER — Ambulatory Visit
Admission: RE | Admit: 2022-05-04 | Discharge: 2022-05-04 | Disposition: A | Discharge status: Home / Self Care | Payer: BC Managed Care – PPO | Source: Ambulatory Visit | Attending: Family Medicine | Admitting: Family Medicine

## 2022-05-04 DIAGNOSIS — Z1231 Encounter for screening mammogram for malignant neoplasm of breast: Secondary | ICD-10-CM

## 2022-05-05 ENCOUNTER — Other Ambulatory Visit: Payer: Self-pay | Admitting: Endocrinology

## 2022-05-11 ENCOUNTER — Ambulatory Visit: Payer: BC Managed Care – PPO | Admitting: Family Medicine

## 2022-05-11 ENCOUNTER — Encounter: Payer: Self-pay | Admitting: Family Medicine

## 2022-05-11 VITALS — BP 124/70 | HR 107 | Ht 65.0 in | Wt 142.0 lb

## 2022-05-11 DIAGNOSIS — E785 Hyperlipidemia, unspecified: Secondary | ICD-10-CM | POA: Diagnosis not present

## 2022-05-11 DIAGNOSIS — E1169 Type 2 diabetes mellitus with other specified complication: Secondary | ICD-10-CM

## 2022-05-11 NOTE — Progress Notes (Signed)
Subjective:    Patient ID: Sherry Hart, female    DOB: 25-Dec-1960, 61 y.o.   MRN: [id number]  HPI  Patient is here today to establish care regarding management of her diabetes.  She was previously seeing an endocrinologist but she is having a difficult time scheduling an appointment so she would like [place] to assume care.  She is currently on Janumet 100/1000 once daily, Jardiance one half of a 25 mg tablet daily, Trulicity 8.30 mg subcu weekly.  Her most recent lab work is listed below.  Her A1c was outstanding at 6.6 and her LDL cholesterol was less than 100.  She had a Difficult time affording the Trulicity and Janumet and Jardiance.  She would like to discontinue Trulicity. Past Medical History:  Diagnosis Date   Abdominal pain    Anemia    B12 deficiency    Biliary colic    Constipation    occasionally not chronic per pt. -no medicines for this    Diabetes mellitus 12 yrs ago    Hyperlipidemia    Hypertension    Pernicious anemia    Unexplained weight loss    Vomiting    Weakness    Past Surgical History:  Procedure Laterality Date   CESAREAN SECTION  11/29/1990, 04/06/1994   COLONOSCOPY  09/01/2004   all normal - in epic   KNEE SURGERY  11/2000   right   MOUTH SURGERY  04/04/1987   UPPER GASTROINTESTINAL ENDOSCOPY  09-01-04   gastric polyp- m.johnson- in epic   WISDOM TOOTH EXTRACTION     Current Outpatient Medications on File Prior to Visit  Medication Sig Dispense Refill   aspirin 81 MG tablet Take 81 mg by mouth daily.     atorvastatin (LIPITOR) 20 MG tablet TAKE 1 TABLET BY MOUTH EVERY DAY 90 tablet 1   Blood Glucose Monitoring Suppl (ONE TOUCH ULTRA 2) W/DEVICE KIT Use to check blood sugar 3 times per day dx code E11.65 1 each 0   Calcium Carbonate-Vit D-Min (CALCIUM 1200 PO) Take by mouth.     cholecalciferol (VITAMIN D3) 25 MCG (1000 UNIT) tablet Take 1,000 Units by mouth daily.     Dulaglutide (TRULICITY) 9.40 HW/8.0SU SOPN INJECT IN THE ABDOMINAL SKIN  AS DIRECTED ONCE A WEEK 6 mL 1   ferrous sulfate 325 (65 FE) MG EC tablet Take 325 mg by mouth daily. Take 1 tablet by mouth daily.     JANUMET XR [phone] MG TB24 TAKE 1 TABLET BY MOUTH EVERY DAY 30 tablet 3   JARDIANCE 25 MG TABS tablet TAKE 1/2 TABLET BY MOUTH EVERY DAY 45 tablet 1   losartan (COZAAR) 100 MG tablet TAKE 1 TABLET BY MOUTH EVERY DAY 90 tablet 1   OneTouch Delica Lancets 11S MISC USE AS INSTRUCTED 100 each 12   ONETOUCH ULTRA test strip USE AS DIRECTED 100 strip 12   vitamin B-12 (CYANOCOBALAMIN) 500 MCG tablet Take 1,000 mcg by mouth daily. Take 1 tablet by mouth daily.     No current facility-administered medications on file prior to visit.   Allergies  Allergen Reactions   Penicillins Hives    Back of neck and upper back.   Social History   Socioeconomic History   Marital status: Married    Spouse name: Not on file   Number of children: Not on file   Years of education: Not on file   Highest education level: Not on file  Occupational History   Not on file  Tobacco Use   Smoking status: Never   Smokeless tobacco: Never  Vaping Use   Vaping Use: Never used  Substance and Sexual Activity   Alcohol use: No    Alcohol/week: 0.0 standard drinks of alcohol   Drug use: No   Sexual activity: Not Currently  Other Topics Concern   Not on file  Social History Narrative   Not on file   Social Determinants of Health   Financial Resource Strain: Not on file  Food Insecurity: Not on file  Transportation Needs: Not on file  Physical Activity: Not on file  Stress: Not on file  Social Connections: Not on file  Intimate Partner Violence: Not on file   Family History  Problem Relation Age of Onset   Cancer Mother        breast   Breast cancer Mother 55   Diabetes Father    Thyroid disease Sister    Hypertension Neg Hx    Colon cancer Neg Hx    Colon polyps Neg Hx    Rectal cancer Neg Hx    Stomach cancer Neg Hx       Review of Systems      Objective:   Physical Exam Vitals reviewed.  HENT:     Head: Normocephalic and atraumatic.     Right Ear: External ear normal.     Left Ear: External ear normal.     Nose: Nose normal.     Mouth/Throat:     Pharynx: No oropharyngeal exudate.  Eyes:     General: No scleral icterus.       Right eye: No discharge.        Left eye: No discharge.     Conjunctiva/sclera: Conjunctivae normal.     Pupils: Pupils are equal, round, and reactive to light.  Cardiovascular:     Rate and Rhythm: Normal rate and regular rhythm.     Heart sounds: Normal heart sounds. No murmur heard.    No friction rub. No gallop.  Pulmonary:     Effort: Pulmonary effort is normal. No respiratory distress.     Breath sounds: Normal breath sounds. No stridor. No wheezing or rales.  Chest:     Chest wall: No tenderness.  Abdominal:     General: Bowel sounds are normal. There is no distension.     Palpations: Abdomen is soft. There is no mass.     Tenderness: There is no abdominal tenderness. There is no guarding or rebound.     Hernia: No hernia is present.  Musculoskeletal:        General: No tenderness or deformity. Normal range of motion.     Cervical back: Normal range of motion and neck supple.  Lymphadenopathy:     Cervical: No cervical adenopathy.  Skin:    General: Skin is warm and dry.     Capillary Refill: Capillary refill takes less than 2 seconds.     Coloration: Skin is not pale.     Findings: No erythema or rash.  Neurological:     Mental Status: She is alert and oriented to person, place, and time.     Cranial Nerves: No cranial nerve deficit.     Sensory: No sensory deficit.     Coordination: Coordination normal.     Deep Tendon Reflexes: Reflexes normal.    Lab on 04/17/2022  Component Date Value Ref Range Status   Cholesterol 04/17/2022 151  0 - 200 mg/dL Final   ATP III Classification  Desirable:  < 200 mg/dL               Borderline High:  200 - 239 mg/dL          High:  >  = 240 mg/dL   Triglycerides 04/17/2022 68.0  0.0 - 149.0 mg/dL Final   Normal:  <150 mg/dLBorderline High:  150 - 199 mg/dL   HDL 04/17/2022 45.60  >39.00 mg/dL Final   VLDL 04/17/2022 13.6  0.0 - 40.0 mg/dL Final   LDL Cholesterol 04/17/2022 92  0 - 99 mg/dL Final   Total CHOL/HDL Ratio 04/17/2022 3   Final                  Men          Women1/2 Average Risk     3.4          3.3Average Risk          5.0          4.42X Average Risk          9.6          7.13X Average Risk          15.0          11.0                       NonHDL 04/17/2022 105.44   Final   NOTE:  Non-HDL goal should be 30 mg/dL higher than patient's LDL goal (i.e. LDL goal of < 70 mg/dL, would have non-HDL goal of < 100 mg/dL)   Sodium 04/17/2022 139  135 - 145 mEq/L Final   Potassium 04/17/2022 4.8  3.5 - 5.1 mEq/L Final   Chloride 04/17/2022 102  96 - 112 mEq/L Final   CO2 04/17/2022 29  19 - 32 mEq/L Final   Glucose, Bld 04/17/2022 92  70 - 99 mg/dL Final   BUN 04/17/2022 15  6 - 23 mg/dL Final   Creatinine, Ser 04/17/2022 1.15  0.40 - 1.20 mg/dL Final   Total Bilirubin 04/17/2022 0.8  0.2 - 1.2 mg/dL Final   Alkaline Phosphatase 04/17/2022 59  39 - 117 U/L Final   AST 04/17/2022 20  0 - 37 U/L Final   ALT 04/17/2022 12  0 - 35 U/L Final   Total Protein 04/17/2022 7.9  6.0 - 8.3 g/dL Final   Albumin 04/17/2022 4.2  3.5 - 5.2 g/dL Final   GFR 04/17/2022 51.16 (L)  >60.00 mL/min Final   Calculated using the CKD-EPI Creatinine Equation (2021)   Calcium 04/17/2022 9.9  8.4 - 10.5 mg/dL Final   Hgb A1c MFr Bld 04/17/2022 6.6 (H)  4.6 - 6.5 % Final   Glycemic Control Guidelines for People with Diabetes:Non Diabetic:  <6%Goal of Therapy: <7%Additional Action Suggested:  >8%           Assessment & Plan:  Type 2 diabetes mellitus with hyperlipidemia ([place]) Given the fact that she is on Januvia, I do believe the Trulicity is likely redundant.  We discussed the benefits of Trulicity regarding weight loss but she elects to  stay on Januvia instead of Trulicity so we will discontinue Trulicity.  Plan to recheck A1c in 3 months.  If A1c rises greater than 7, I would increase metformin to 2000 mg a day and I would increase Jardiance to 25 mg a day.  Diabetic foot exam was performed today and was normal.

## 2022-06-04 ENCOUNTER — Other Ambulatory Visit: Payer: Self-pay | Admitting: Endocrinology

## 2022-07-06 ENCOUNTER — Other Ambulatory Visit: Payer: Self-pay | Admitting: Endocrinology

## 2022-08-24 ENCOUNTER — Ambulatory Visit: Payer: BC Managed Care – PPO | Admitting: Endocrinology

## 2022-09-07 ENCOUNTER — Other Ambulatory Visit: Payer: BC Managed Care – PPO

## 2022-09-07 DIAGNOSIS — D539 Nutritional anemia, unspecified: Secondary | ICD-10-CM

## 2022-09-07 DIAGNOSIS — I10 Essential (primary) hypertension: Secondary | ICD-10-CM

## 2022-09-07 DIAGNOSIS — E1169 Type 2 diabetes mellitus with other specified complication: Secondary | ICD-10-CM

## 2022-09-08 LAB — LIPID PANEL
Cholesterol: 174 mg/dL (ref <200)
HDL: 52 mg/dL (ref >=50)
LDL Cholesterol (Calc): 105 mg/dL (calc) — ABNORMAL HIGH
Non-HDL Cholesterol (Calc): 122 mg/dL (calc) (ref <130)
Total CHOL/HDL Ratio: 3.3 (calc) (ref <5.0)
Triglycerides: 80 mg/dL (ref <150)

## 2022-09-08 LAB — CBC WITH DIFFERENTIAL/PLATELET
Absolute Monocytes: 460 cells/uL (ref 200–950)
Basophils Absolute: 59 cells/uL (ref 0–200)
Basophils Relative: 1 %
Eosinophils Absolute: 100 cells/uL (ref 15–500)
Eosinophils Relative: 1.7 %
HCT: 37.3 % (ref 35.0–45.0)
Hemoglobin: 12 g/dL (ref 11.7–15.5)
Lymphs Abs: 1434 cells/uL (ref 850–3900)
MCH: 26.7 pg — ABNORMAL LOW (ref 27.0–33.0)
MCHC: 32.2 g/dL (ref 32.0–36.0)
MCV: 83.1 fL (ref 80.0–100.0)
MPV: 10.9 fL (ref 7.5–12.5)
Monocytes Relative: 7.8 %
Neutro Abs: 3847 cells/uL (ref 1500–7800)
Neutrophils Relative %: 65.2 %
Platelets: 288 10*3/uL (ref 140–400)
RBC: 4.49 10*6/uL (ref 3.80–5.10)
RDW: 13.5 % (ref 11.0–15.0)
Total Lymphocyte: 24.3 %
WBC: 5.9 10*3/uL (ref 3.8–10.8)

## 2022-09-08 LAB — COMPLETE METABOLIC PANEL WITH GFR
AG Ratio: 1.2 (calc) (ref 1.0–2.5)
ALT: 13 U/L (ref 6–29)
AST: 16 U/L (ref 10–35)
Albumin: 4.2 g/dL (ref 3.6–5.1)
Alkaline phosphatase (APISO): 78 U/L (ref 37–153)
BUN/Creatinine Ratio: 14 (calc) (ref 6–22)
BUN: 16 mg/dL (ref 7–25)
CO2: 29 mmol/L (ref 20–32)
Calcium: 9.9 mg/dL (ref 8.6–10.4)
Chloride: 102 mmol/L (ref 98–110)
Creat: 1.14 mg/dL — ABNORMAL HIGH (ref 0.50–1.05)
Globulin: 3.5 g/dL (calc) (ref 1.9–3.7)
Glucose, Bld: 114 mg/dL — ABNORMAL HIGH (ref 65–99)
Potassium: 4.9 mmol/L (ref 3.5–5.3)
Sodium: 139 mmol/L (ref 135–146)
Total Bilirubin: 0.7 mg/dL (ref 0.2–1.2)
Total Protein: 7.7 g/dL (ref 6.1–8.1)
eGFR: 54 mL/min/{1.73_m2} — ABNORMAL LOW (ref >=60)

## 2022-09-08 LAB — HEMOGLOBIN A1C
Hgb A1c MFr Bld: 7.7 % of total Hgb — ABNORMAL HIGH (ref <5.7)
Mean Plasma Glucose: 174 mg/dL
eAG (mmol/L): 9.7 mmol/L

## 2022-09-11 ENCOUNTER — Ambulatory Visit: Payer: BC Managed Care – PPO | Admitting: Family Medicine

## 2022-09-14 ENCOUNTER — Ambulatory Visit: Payer: BC Managed Care – PPO | Admitting: Family Medicine

## 2022-09-14 ENCOUNTER — Encounter: Payer: Self-pay | Admitting: Family Medicine

## 2022-09-14 VITALS — BP 146/78 | HR 97 | Temp 98.6°F | Ht 65.0 in | Wt 146.8 lb

## 2022-09-14 DIAGNOSIS — E785 Hyperlipidemia, unspecified: Secondary | ICD-10-CM | POA: Diagnosis not present

## 2022-09-14 DIAGNOSIS — E1169 Type 2 diabetes mellitus with other specified complication: Secondary | ICD-10-CM | POA: Diagnosis not present

## 2022-09-14 DIAGNOSIS — I10 Essential (primary) hypertension: Secondary | ICD-10-CM | POA: Diagnosis not present

## 2022-09-14 MED ORDER — TRULICITY 0.75 MG/0.5ML ~~LOC~~ SOAJ: 0.7500 mg | SUBCUTANEOUS | 5 refills | Status: DC

## 2022-09-14 MED ORDER — METFORMIN HCL 1000 MG PO TABS: 1000.0000 mg | ORAL_TABLET | Freq: Two times a day (BID) | ORAL | 3 refills | Status: DC

## 2022-09-14 NOTE — Progress Notes (Signed)
Subjective:    Patient ID: Sherry Hart, female    DOB: 1961-03-03, 62 y.o.   MRN: [id number]  HPI 11/23 Patient is here today to establish care regarding management of her diabetes.  She was previously seeing an endocrinologist but she is having a difficult time scheduling an appointment so she would like [place] to assume care.  She is currently on Janumet 100/1000 once daily, Jardiance one half of a 25 mg tablet daily, Trulicity [id number] mg subcu weekly.  Her most recent lab work is listed below.  Her A1c was outstanding at 6.6 and her LDL cholesterol was less than 100.  She had a Difficult time affording the Trulicity and Janumet and Jardiance.  She would like to discontinue Trulicity.  At that time, my plan was: Given the fact that she is on Januvia, I do believe the Trulicity is likely redundant.  We discussed the benefits of Trulicity regarding weight loss but she elects to stay on Januvia instead of Trulicity so we will discontinue Trulicity.  Plan to recheck A1c in 3 months.  If A1c rises greater than 7, I would increase metformin to 2000 mg a day and I would increase Jardiance to 25 mg a day.  Diabetic foot exam was performed today and was normal.  09/14/22 Since I last saw the patient, HgA1c increased from 6.6 to 7.7.  Patient quit Trulicity and elected to stay on Janumet.  Her blood pressure today is also elevated.  She denies any chest pain shortness of breath or dyspnea on exertion.  She has not been checking her blood pressure at home. Past Medical History:  Diagnosis Date   Abdominal pain    Anemia    B12 deficiency    Biliary colic    Constipation    occasionally not chronic per pt. -no medicines for this    Diabetes mellitus 12 yrs ago    Hyperlipidemia    Hypertension    Pernicious anemia    Unexplained weight loss    Vomiting    Weakness    Past Surgical History:  Procedure Laterality Date   CESAREAN SECTION  11/29/1990, 04/06/1994   COLONOSCOPY  09/01/2004   all  normal - in epic   KNEE SURGERY  11/2000   right   MOUTH SURGERY  04/04/1987   UPPER GASTROINTESTINAL ENDOSCOPY  09-01-04   gastric polyp- m.johnson- in epic   WISDOM TOOTH EXTRACTION     Current Outpatient Medications on File Prior to Visit  Medication Sig Dispense Refill   aspirin 81 MG tablet Take 81 mg by mouth daily.     atorvastatin (LIPITOR) 20 MG tablet TAKE 1 TABLET BY MOUTH EVERY DAY 90 tablet 1   Blood Glucose Monitoring Suppl (ONE TOUCH ULTRA 2) W/DEVICE KIT Use to check blood sugar 3 times per day dx code E11.65 1 each 0   Calcium Carbonate-Vit D-Min (CALCIUM 1200 PO) Take by mouth.     cholecalciferol (VITAMIN D3) 25 MCG (1000 UNIT) tablet Take 1,000 Units by mouth daily.     Dulaglutide (TRULICITY) A999333 0000000 SOPN INJECT IN THE ABDOMINAL SKIN AS DIRECTED ONCE A WEEK 6 mL 1   ferrous sulfate 325 (65 FE) MG EC tablet Take 325 mg by mouth daily. Take 1 tablet by mouth daily.     JANUMET XR [phone] MG TB24 TAKE 1 TABLET BY MOUTH EVERY DAY 30 tablet 3   JARDIANCE 25 MG TABS tablet TAKE 1/2 TABLET BY MOUTH EVERY DAY 45 tablet 1  losartan (COZAAR) 100 MG tablet TAKE 1 TABLET BY MOUTH EVERY DAY 90 tablet 1   OneTouch Delica Lancets 99991111 MISC USE AS INSTRUCTED 100 each 12   ONETOUCH ULTRA test strip USE AS DIRECTED 100 strip 12   vitamin B-12 (CYANOCOBALAMIN) 500 MCG tablet Take 1,000 mcg by mouth daily. Take 1 tablet by mouth daily.     No current facility-administered medications on file prior to visit.   Allergies  Allergen Reactions   Penicillins Hives    Back of neck and upper back.   Social History   Socioeconomic History   Marital status: Married    Spouse name: Not on file   Number of children: Not on file   Years of education: Not on file   Highest education level: Not on file  Occupational History   Not on file  Tobacco Use   Smoking status: Never   Smokeless tobacco: Never  Vaping Use   Vaping Use: Never used  Substance and Sexual Activity   Alcohol  use: No    Alcohol/week: 0.0 standard drinks of alcohol   Drug use: No   Sexual activity: Not Currently  Other Topics Concern   Not on file  Social History Narrative   Not on file   Social Determinants of Health   Financial Resource Strain: Not on file  Food Insecurity: Not on file  Transportation Needs: Not on file  Physical Activity: Not on file  Stress: Not on file  Social Connections: Not on file  Intimate Partner Violence: Not on file   Family History  Problem Relation Age of Onset   Cancer Mother        breast   Breast cancer Mother 60   Diabetes Father    Thyroid disease Sister    Hypertension Neg Hx    Colon cancer Neg Hx    Colon polyps Neg Hx    Rectal cancer Neg Hx    Stomach cancer Neg Hx       Review of Systems     Objective:   Physical Exam Vitals reviewed.  HENT:     Head: Normocephalic and atraumatic.     Right Ear: External ear normal.     Left Ear: External ear normal.     Nose: Nose normal.     Mouth/Throat:     Pharynx: No oropharyngeal exudate.  Eyes:     General: No scleral icterus.       Right eye: No discharge.        Left eye: No discharge.     Conjunctiva/sclera: Conjunctivae normal.     Pupils: Pupils are equal, round, and reactive to light.  Cardiovascular:     Rate and Rhythm: Normal rate and regular rhythm.     Heart sounds: Normal heart sounds. No murmur heard.    No friction rub. No gallop.  Pulmonary:     Effort: Pulmonary effort is normal. No respiratory distress.     Breath sounds: Normal breath sounds. No stridor. No wheezing or rales.  Chest:     Chest wall: No tenderness.  Abdominal:     General: Bowel sounds are normal. There is no distension.     Palpations: Abdomen is soft. There is no mass.     Tenderness: There is no abdominal tenderness. There is no guarding or rebound.     Hernia: No hernia is present.  Musculoskeletal:        General: No tenderness or deformity. Normal range of motion.  Cervical  back: Normal range of motion and neck supple.  Lymphadenopathy:     Cervical: No cervical adenopathy.  Skin:    General: Skin is warm and dry.     Capillary Refill: Capillary refill takes less than 2 seconds.     Coloration: Skin is not pale.     Findings: No erythema or rash.  Neurological:     Mental Status: She is alert and oriented to person, place, and time.     Cranial Nerves: No cranial nerve deficit.     Sensory: No sensory deficit.     Coordination: Coordination normal.     Deep Tendon Reflexes: Reflexes normal.    Lab on 09/07/2022  Component Date Value Ref Range Status   WBC 09/07/2022 5.9  3.8 - 10.8 Thousand/uL Final   RBC 09/07/2022 4.49  3.80 - 5.10 Million/uL Final   Hemoglobin 09/07/2022 12.0  11.7 - 15.5 g/dL Final   HCT 09/07/2022 37.3  35.0 - 45.0 % Final   MCV 09/07/2022 83.1  80.0 - 100.0 fL Final   MCH 09/07/2022 26.7 (L)  27.0 - 33.0 pg Final   MCHC 09/07/2022 32.2  32.0 - 36.0 g/dL Final   RDW 09/07/2022 13.5  11.0 - 15.0 % Final   Platelets 09/07/2022 288  140 - 400 Thousand/uL Final   MPV 09/07/2022 10.9  7.5 - 12.5 fL Final   Neutro Abs 09/07/2022 3,847  1,500 - 7,800 cells/uL Final   Lymphs Abs 09/07/2022 1,434  850 - 3,900 cells/uL Final   Absolute Monocytes 09/07/2022 460  200 - 950 cells/uL Final   Eosinophils Absolute 09/07/2022 100  15 - 500 cells/uL Final   Basophils Absolute 09/07/2022 59  0 - 200 cells/uL Final   Neutrophils Relative % 09/07/2022 65.2  % Final   Total Lymphocyte 09/07/2022 24.3  % Final   Monocytes Relative 09/07/2022 7.8  % Final   Eosinophils Relative 09/07/2022 1.7  % Final   Basophils Relative 09/07/2022 1.0  % Final   Glucose, Bld 09/07/2022 114 (H)  65 - 99 mg/dL Final   Comment: .            Fasting reference interval . For someone without known diabetes, a glucose value between 100 and 125 mg/dL is consistent with prediabetes and should be confirmed with a follow-up test. .    BUN 09/07/2022 16  7 - 25 mg/dL  Final   Creat 09/07/2022 1.14 (H)  0.50 - 1.05 mg/dL Final   eGFR 09/07/2022 54 (L)  > OR = 60 mL/min/1.73m2 Final   BUN/Creatinine Ratio 09/07/2022 14  6 - 22 (calc) Final   Sodium 09/07/2022 139  135 - 146 mmol/L Final   Potassium 09/07/2022 4.9  3.5 - 5.3 mmol/L Final   Chloride 09/07/2022 102  98 - 110 mmol/L Final   CO2 09/07/2022 29  20 - 32 mmol/L Final   Calcium 09/07/2022 9.9  8.6 - 10.4 mg/dL Final   Total Protein 09/07/2022 7.7  6.1 - 8.1 g/dL Final   Albumin 09/07/2022 4.2  3.6 - 5.1 g/dL Final   Globulin 09/07/2022 3.5  1.9 - 3.7 g/dL (calc) Final   AG Ratio 09/07/2022 1.2  1.0 - 2.5 (calc) Final   Total Bilirubin 09/07/2022 0.7  0.2 - 1.2 mg/dL Final   Alkaline phosphatase (APISO) 09/07/2022 78  37 - 153 U/L Final   AST 09/07/2022 16  10 - 35 U/L Final   ALT 09/07/2022 13  6 - 29 U/L  Final   Hgb A1c MFr Bld 09/07/2022 7.7 (H)  <5.7 % of total Hgb Final   Comment: For someone without known diabetes, a hemoglobin A1c value of 6.5% or greater indicates that they may have  diabetes and this should be confirmed with a follow-up  test. . For someone with known diabetes, a value <7% indicates  that their diabetes is well controlled and a value  greater than or equal to 7% indicates suboptimal  control. A1c targets should be individualized based on  duration of diabetes, age, comorbid conditions, and  other considerations. . Currently, no consensus exists regarding use of hemoglobin A1c for diagnosis of diabetes for children. .    Mean Plasma Glucose 09/07/2022 174  mg/dL Final   eAG (mmol/L) 09/07/2022 9.7  mmol/L Final   Comment: . This test was performed on the Roche cobas c503 platform. Effective 03/23/22, a change in test platforms from the [employer] Architect to the Roche cobas c503 may have shifted HbA1c results compared to historical results. Based on laboratory validation testing conducted at [place] [facility] relative to the [employer] platform had an  average increase in HbA1c value of < or = 0.3%. This difference is within accepted  variability established by the [facility]. Note that not all individuals will have had a shift in their results and direct comparisons between historical and current results for testing conducted on different platforms is not recommended.    Cholesterol 09/07/2022 174  <200 mg/dL Final   HDL 09/07/2022 52  > OR = 50 mg/dL Final   Triglycerides 09/07/2022 80  <150 mg/dL Final   LDL Cholesterol (Calc) 09/07/2022 105 (H)  mg/dL (calc) Final   Comment: Reference range: <100 . Desirable range <100 mg/dL for primary prevention;   <70 mg/dL for patients with CHD or diabetic patients  with > or = 2 CHD risk factors. Marland Kitchen LDL-C is now calculated using the Martin-Hopkins  calculation, which is a validated novel method providing  better accuracy than the Friedewald equation in the  estimation of LDL-C.  Cresenciano Genre et al. Annamaria Helling. [id number]): 2061-2068  (http://education.[facility].com/faq/FAQ164)    Total CHOL/HDL Ratio 09/07/2022 3.3  <5.0 (calc) Final   Non-HDL Cholesterol (Calc) 09/07/2022 122  <130 mg/dL (calc) Final   Comment: For patients with diabetes plus 1 major ASCVD risk  factor, treating to a non-HDL-C goal of <100 mg/dL  (LDL-C of <70 mg/dL) is considered a therapeutic  option.           Assessment & Plan:  Type 2 diabetes mellitus with hyperlipidemia  Essential hypertension, benign First discontinue Janumet.  Second increase metformin to 1000 mg twice a day.  Third resume Trulicity A999333 mg subcu weekly.  Fourth of asked the patient to check her blood pressure twice a day for the next week and report values to me.  She has stage III chronic kidney disease with a GFR around 50.  I would like to keep her blood pressure less than 130/80.  If not in that range we may need to add additional medication to lower the blood pressure.

## 2022-09-24 ENCOUNTER — Other Ambulatory Visit: Payer: Self-pay | Admitting: Endocrinology

## 2022-10-01 ENCOUNTER — Other Ambulatory Visit: Payer: Self-pay | Admitting: Endocrinology

## 2022-10-26 ENCOUNTER — Other Ambulatory Visit: Payer: Self-pay | Admitting: Endocrinology

## 2022-11-08 IMAGING — MG MM DIGITAL SCREENING BILAT W/ TOMO AND CAD
8 series · 8 of 24 positions shown · non-contrast
Comparison: Previous exam(s).

CLINICAL DATA: Screening.

EXAM:
DIGITAL SCREENING BILATERAL MAMMOGRAM WITH TOMOSYNTHESIS AND CAD
TECHNIQUE: Bilateral screening digital craniocaudal and mediolateral oblique
mammograms were obtained. Bilateral screening digital breast
tomosynthesis was performed. The images were evaluated with
computer-aided detection.

[L MLO synth-2D]
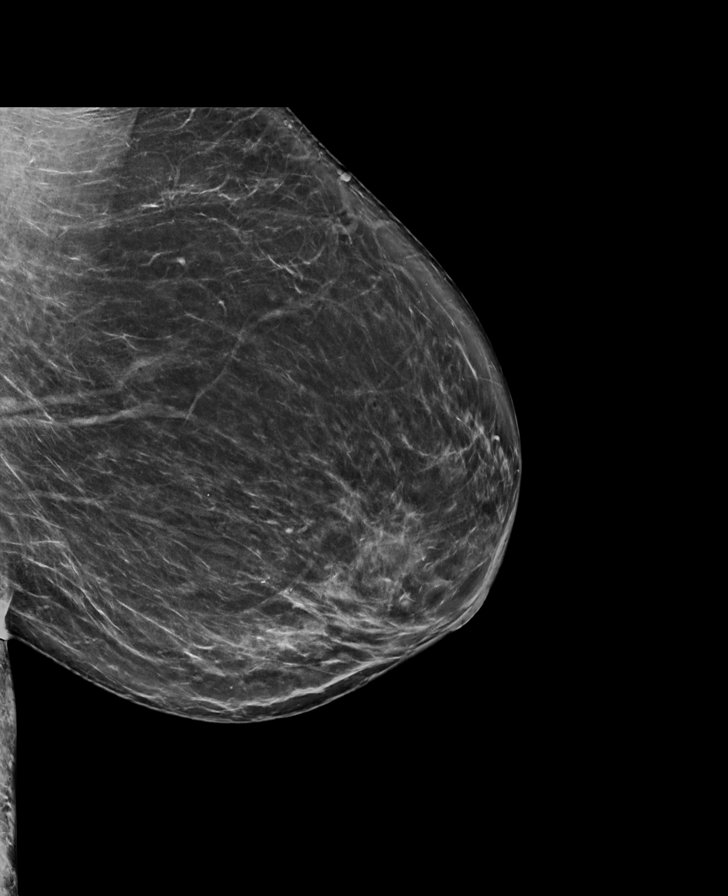

[R MLO synth-2D]
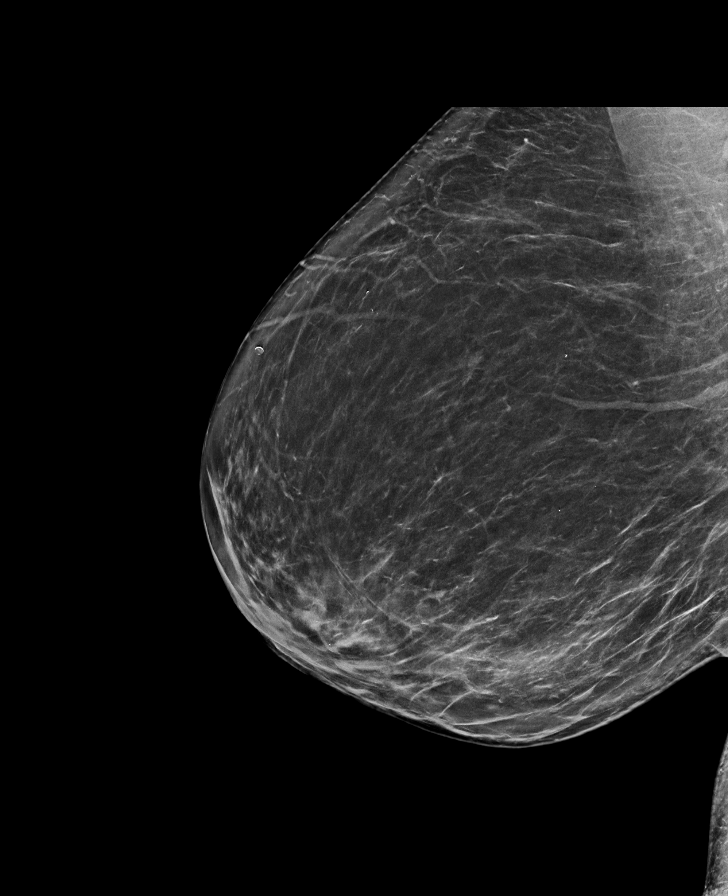

[L CC synth-2D]
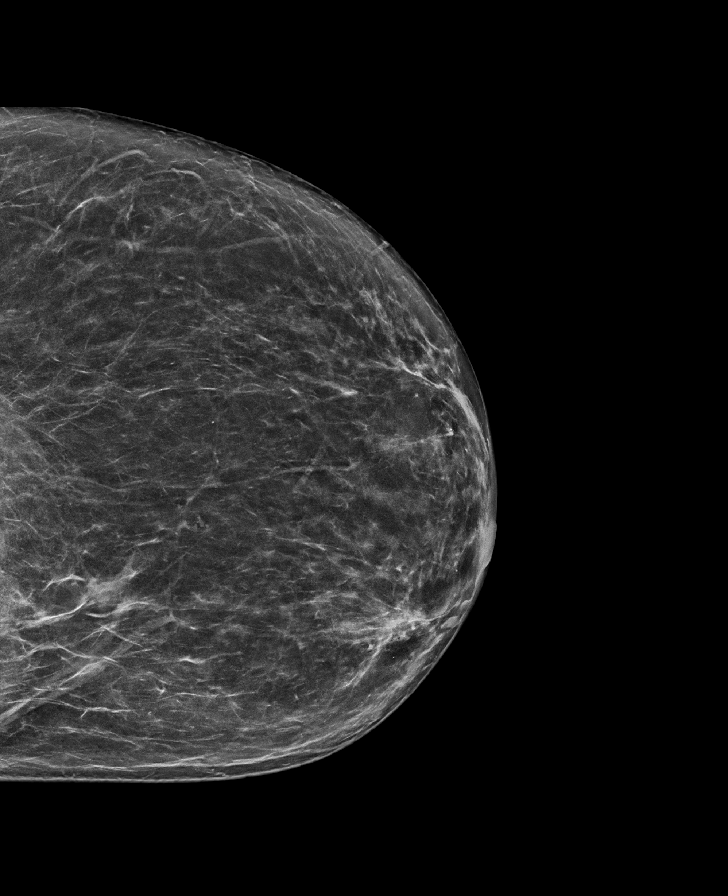

[R CC synth-2D]
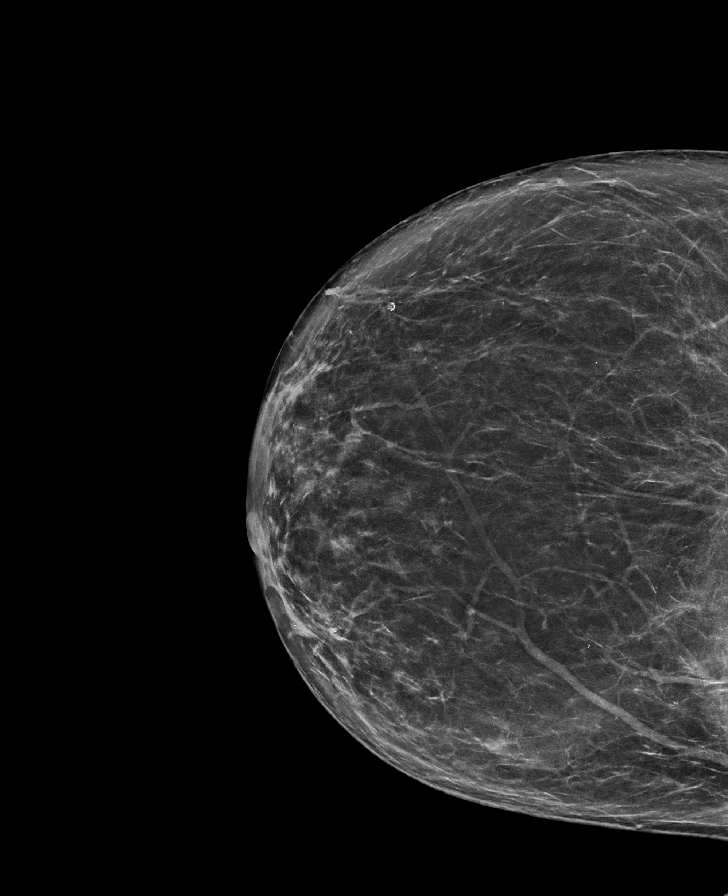

[L MLO tomo · tomo slice 45/88.0]
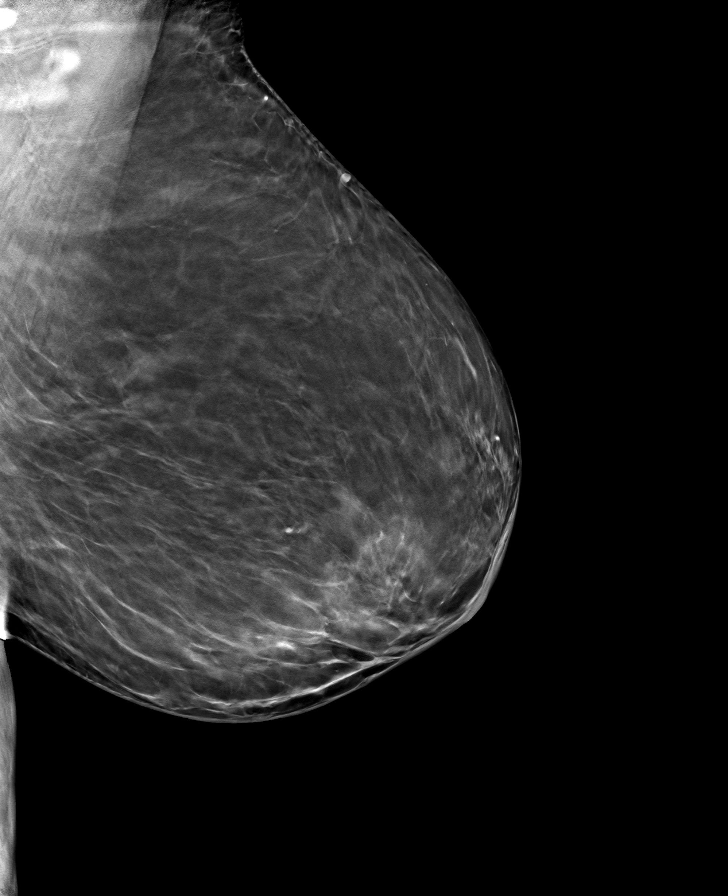

[L CC tomo · tomo slice 38/75.0]
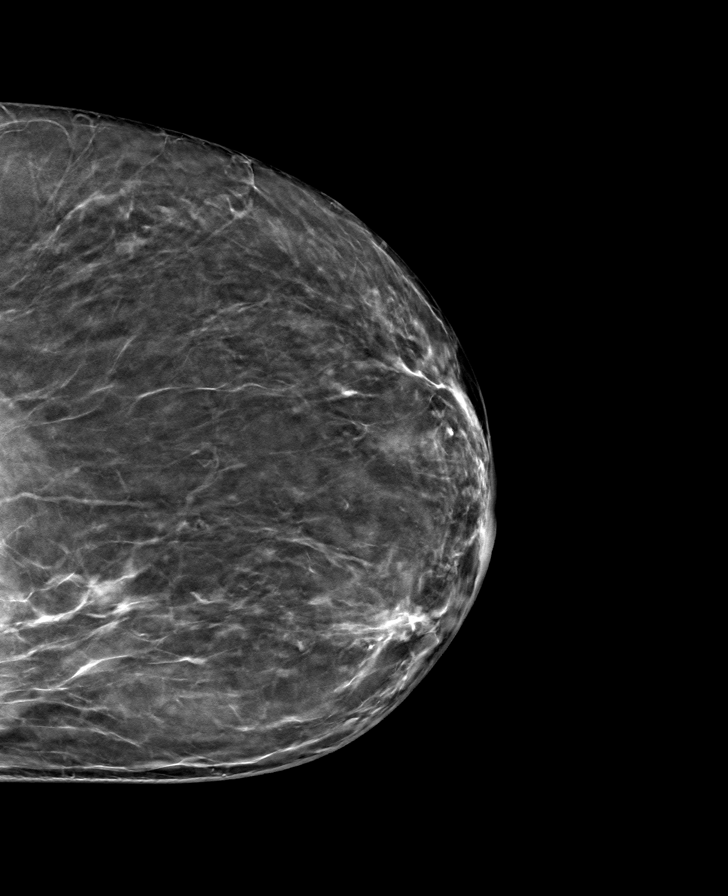

[R CC tomo · tomo slice 39/76.0]
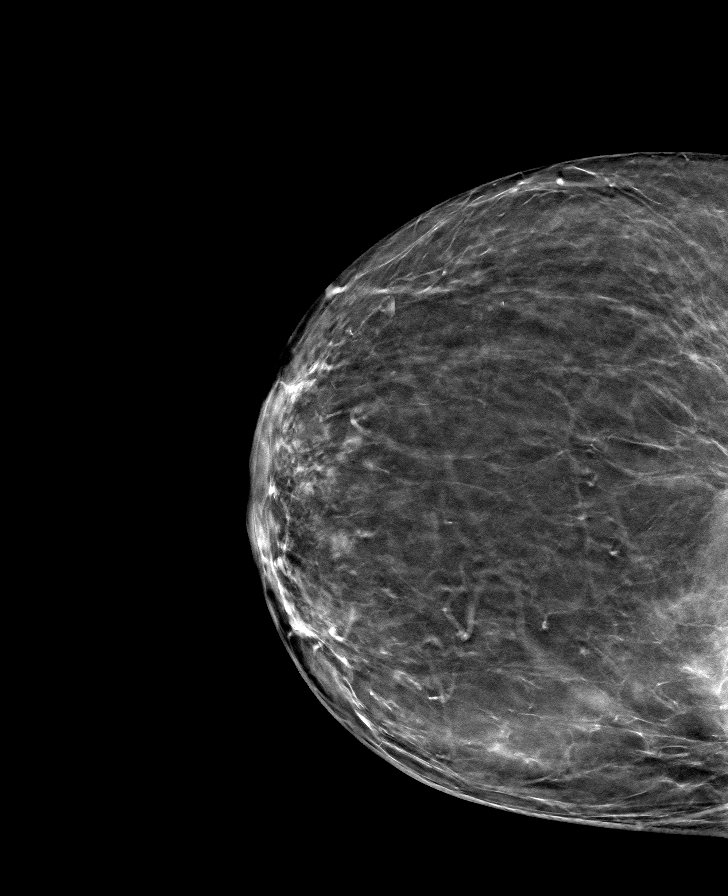

[R MLO tomo · tomo slice 44/87.0]
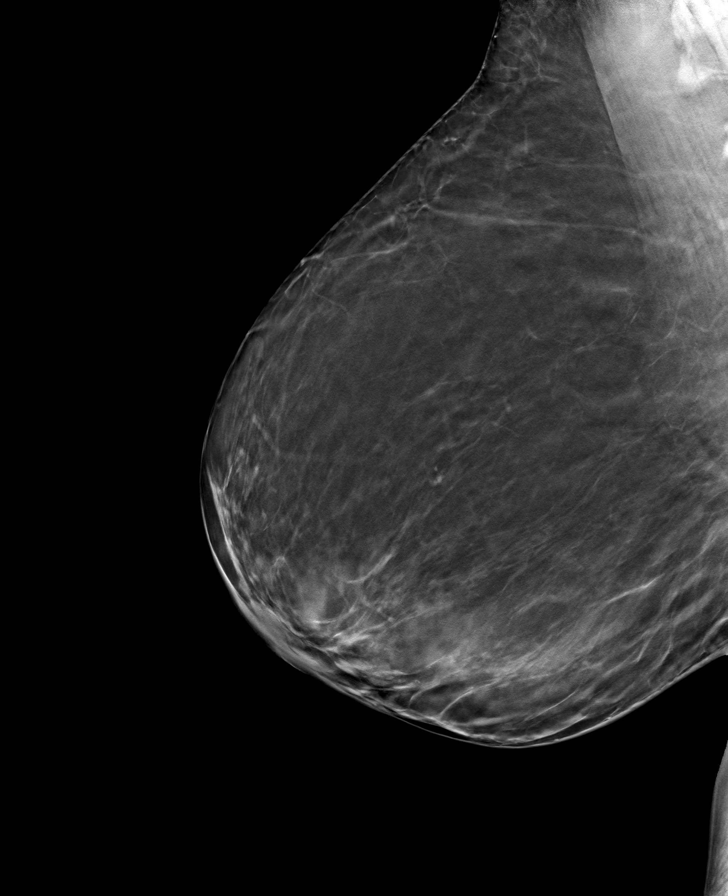

[8 of 24 positions shown; findings below may reference images not displayed]

ACR Breast Density Category b: There are scattered areas of
fibroglandular density.
FINDINGS: There are no findings suspicious for malignancy.
IMPRESSION: No mammographic evidence of malignancy. A result letter of this
screening mammogram will be mailed directly to the patient.

RECOMMENDATION:
Screening mammogram in one year. (Code:51-O-LD2)

BI-RADS CATEGORY  1: Negative.

## 2022-11-18 ENCOUNTER — Other Ambulatory Visit: Payer: Self-pay | Admitting: Endocrinology

## 2022-11-22 ENCOUNTER — Other Ambulatory Visit: Payer: Self-pay | Admitting: Endocrinology

## 2022-12-09 LAB — HM DIABETES EYE EXAM

## 2022-12-10 ENCOUNTER — Ambulatory Visit: Payer: BC Managed Care – PPO | Admitting: Family Medicine

## 2022-12-22 ENCOUNTER — Other Ambulatory Visit: Payer: BC Managed Care – PPO

## 2022-12-22 DIAGNOSIS — E1169 Type 2 diabetes mellitus with other specified complication: Secondary | ICD-10-CM

## 2022-12-22 DIAGNOSIS — E538 Deficiency of other specified B group vitamins: Secondary | ICD-10-CM

## 2022-12-22 DIAGNOSIS — D539 Nutritional anemia, unspecified: Secondary | ICD-10-CM

## 2022-12-22 DIAGNOSIS — I10 Essential (primary) hypertension: Secondary | ICD-10-CM

## 2022-12-22 LAB — CBC WITH DIFFERENTIAL/PLATELET
Absolute Monocytes: 684 cells/uL (ref 200–950)
Basophils Relative: 0.7 %
Eosinophils Absolute: 240 cells/uL (ref 15–500)
HCT: 36.6 % (ref 35.0–45.0)
Lymphs Abs: 1362 cells/uL (ref 850–3900)
MCH: 26.2 pg — ABNORMAL LOW (ref 27.0–33.0)
MCHC: 31.4 g/dL — ABNORMAL LOW (ref 32.0–36.0)
MCV: 83.4 fL (ref 80.0–100.0)
Neutro Abs: 3672 cells/uL (ref 1500–7800)
Platelets: 296 10*3/uL (ref 140–400)
RBC: 4.39 10*6/uL (ref 3.80–5.10)
RDW: 14.1 % (ref 11.0–15.0)
Total Lymphocyte: 22.7 %

## 2022-12-23 LAB — COMPLETE METABOLIC PANEL WITH GFR
AG Ratio: 1.2 (calc) (ref 1.0–2.5)
ALT: 13 U/L (ref 6–29)
AST: 18 U/L (ref 10–35)
Albumin: 4.3 g/dL (ref 3.6–5.1)
Alkaline phosphatase (APISO): 70 U/L (ref 37–153)
BUN/Creatinine Ratio: 19 (calc) (ref 6–22)
BUN: 21 mg/dL (ref 7–25)
CO2: 27 mmol/L (ref 20–32)
Calcium: 10 mg/dL (ref 8.6–10.4)
Chloride: 101 mmol/L (ref 98–110)
Creat: 1.13 mg/dL — ABNORMAL HIGH (ref 0.50–1.05)
Globulin: 3.6 g/dL (calc) (ref 1.9–3.7)
Glucose, Bld: 95 mg/dL (ref 65–99)
Potassium: 4.5 mmol/L (ref 3.5–5.3)
Sodium: 137 mmol/L (ref 135–146)
Total Bilirubin: 0.5 mg/dL (ref 0.2–1.2)
Total Protein: 7.9 g/dL (ref 6.1–8.1)
eGFR: 55 mL/min/{1.73_m2} — ABNORMAL LOW (ref >=60)

## 2022-12-23 LAB — MICROALBUMIN / CREATININE URINE RATIO
Creatinine, Urine: 105 mg/dL (ref 20–275)
Microalb Creat Ratio: 10 mg/g creat (ref <30)
Microalb, Ur: 1 mg/dL

## 2022-12-23 LAB — LIPID PANEL
Cholesterol: 179 mg/dL (ref <200)
HDL: 49 mg/dL — ABNORMAL LOW (ref >=50)
LDL Cholesterol (Calc): 113 mg/dL (calc) — ABNORMAL HIGH
Non-HDL Cholesterol (Calc): 130 mg/dL (calc) — ABNORMAL HIGH (ref <130)
Total CHOL/HDL Ratio: 3.7 (calc) (ref <5.0)
Triglycerides: 80 mg/dL (ref <150)

## 2022-12-23 LAB — CBC WITH DIFFERENTIAL/PLATELET
Basophils Absolute: 42 cells/uL (ref 0–200)
Eosinophils Relative: 4 %
Hemoglobin: 11.5 g/dL — ABNORMAL LOW (ref 11.7–15.5)
MPV: 10.9 fL (ref 7.5–12.5)
Monocytes Relative: 11.4 %
Neutrophils Relative %: 61.2 %
WBC: 6 10*3/uL (ref 3.8–10.8)

## 2022-12-23 LAB — HEMOGLOBIN A1C
Hgb A1c MFr Bld: 6.7 % of total Hgb — ABNORMAL HIGH (ref <5.7)
Mean Plasma Glucose: 146 mg/dL
eAG (mmol/L): 8.1 mmol/L

## 2022-12-23 LAB — VITAMIN B12: Vitamin B-12: 1036 pg/mL (ref 200–1100)

## 2022-12-25 ENCOUNTER — Encounter: Payer: Self-pay | Admitting: Family Medicine

## 2022-12-25 ENCOUNTER — Ambulatory Visit: Payer: BC Managed Care – PPO | Admitting: Family Medicine

## 2022-12-25 VITALS — BP 132/76 | HR 97 | Temp 98.1°F | Ht 65.0 in | Wt 147.2 lb

## 2022-12-25 DIAGNOSIS — I10 Essential (primary) hypertension: Secondary | ICD-10-CM

## 2022-12-25 DIAGNOSIS — E785 Hyperlipidemia, unspecified: Secondary | ICD-10-CM

## 2022-12-25 DIAGNOSIS — E1169 Type 2 diabetes mellitus with other specified complication: Secondary | ICD-10-CM

## 2022-12-25 MED ORDER — ATORVASTATIN CALCIUM 40 MG PO TABS: 40.0000 mg | ORAL_TABLET | Freq: Every day | ORAL | 3 refills | Status: DC

## 2022-12-25 MED ORDER — TRULICITY 1.5 MG/0.5ML ~~LOC~~ SOAJ: 1.5000 mg | SUBCUTANEOUS | 3 refills | Status: DC

## 2022-12-25 NOTE — Progress Notes (Signed)
Subjective:    Patient ID: Sherry Hart, female    DOB: 1960/06/28, 62 y.o.   MRN: [id number]  HPI Patient is here today to follow-up on her diabetes.  She is currently on metformin 1000 mg twice a day, Jardiance 1/2 tablet daily, and Trulicity 0.75 mg subcu weekly.  She has loose stool with metformin but otherwise is tolerating her medications well.  Her A1c is dropped from 7.7-6.7.  She denies any severe nausea.  She denies any hypoglycemia.  She denies any chest pain or shortness of breath.  She continues to take atorvastatin 20 mg a day for hyperlipidemia however her LDL cholesterol remains elevated at 113  Lab on 12/22/2022  Component Date Value Ref Range Status   WBC 12/22/2022 6.0  3.8 - 10.8 Thousand/uL Final   RBC 12/22/2022 4.39  3.80 - 5.10 Million/uL Final   Hemoglobin 12/22/2022 11.5 (L)  11.7 - 15.5 g/dL Final   HCT 40/98/1191 36.6  35.0 - 45.0 % Final   MCV 12/22/2022 83.4  80.0 - 100.0 fL Final   MCH 12/22/2022 26.2 (L)  27.0 - 33.0 pg Final   MCHC 12/22/2022 31.4 (L)  32.0 - 36.0 g/dL Final   RDW 47/82/9562 14.1  11.0 - 15.0 % Final   Platelets 12/22/2022 296  140 - 400 Thousand/uL Final   MPV 12/22/2022 10.9  7.5 - 12.5 fL Final   Neutro Abs 12/22/2022 3,672  1,500 - 7,800 cells/uL Final   Lymphs Abs 12/22/2022 1,362  850 - 3,900 cells/uL Final   Absolute Monocytes 12/22/2022 684  200 - 950 cells/uL Final   Eosinophils Absolute 12/22/2022 240  15 - 500 cells/uL Final   Basophils Absolute 12/22/2022 42  0 - 200 cells/uL Final   Neutrophils Relative % 12/22/2022 61.2  % Final   Total Lymphocyte 12/22/2022 22.7  % Final   Monocytes Relative 12/22/2022 11.4  % Final   Eosinophils Relative 12/22/2022 4.0  % Final   Basophils Relative 12/22/2022 0.7  % Final   Glucose, Bld 12/22/2022 95  65 - 99 mg/dL Final   Comment: .            Fasting reference interval .    BUN 12/22/2022 21  7 - 25 mg/dL Final   Creat 13/01/6577 1.13 (H)  0.50 - 1.05 mg/dL Final    eGFR 46/96/2952 55 (L)  > OR = 60 mL/min/1.46m2 Final   BUN/Creatinine Ratio 12/22/2022 19  6 - 22 (calc) Final   Sodium 12/22/2022 137  135 - 146 mmol/L Final   Potassium 12/22/2022 4.5  3.5 - 5.3 mmol/L Final   Chloride 12/22/2022 101  98 - 110 mmol/L Final   CO2 12/22/2022 27  20 - 32 mmol/L Final   Calcium 12/22/2022 10.0  8.6 - 10.4 mg/dL Final   Total Protein 84/13/2440 7.9  6.1 - 8.1 g/dL Final   Albumin 04/11/2535 4.3  3.6 - 5.1 g/dL Final   Globulin 64/40/3474 3.6  1.9 - 3.7 g/dL (calc) Final   AG Ratio 12/22/2022 1.2  1.0 - 2.5 (calc) Final   Total Bilirubin 12/22/2022 0.5  0.2 - 1.2 mg/dL Final   Alkaline phosphatase (APISO) 12/22/2022 70  37 - 153 U/L Final   AST 12/22/2022 18  10 - 35 U/L Final   ALT 12/22/2022 13  6 - 29 U/L Final   Hgb A1c MFr Bld 12/22/2022 6.7 (H)  <5.7 % of total Hgb Final   Comment: For someone without known diabetes,  a hemoglobin A1c value of 6.5% or greater indicates that they may have  diabetes and this should be confirmed with a follow-up  test. . For someone with known diabetes, a value <7% indicates  that their diabetes is well controlled and a value  greater than or equal to 7% indicates suboptimal  control. A1c targets should be individualized based on  duration of diabetes, age, comorbid conditions, and  other considerations. . Currently, no consensus exists regarding use of hemoglobin A1c for diagnosis of diabetes for children. .    Mean Plasma Glucose 12/22/2022 146  mg/dL Final   eAG (mmol/L) 16/03/9603 8.1  mmol/L Final   Comment: . This test was performed on the Roche cobas c503 platform. Effective 03/23/22, a change in test platforms from the Abbott Architect to the Roche cobas c503 may have shifted HbA1c results compared to historical results. Based on laboratory validation testing conducted at [facility], the Roche platform relative to the Abbott platform had an average increase in HbA1c value of < or = 0.3%. This difference is  within accepted  variability established by the [facility]. Note that not all individuals will have had a shift in their results and direct comparisons between historical and current results for testing conducted on different platforms is not recommended.    Cholesterol 12/22/2022 179  <200 mg/dL Final   HDL 54/02/8118 49 (L)  > OR = 50 mg/dL Final   Triglycerides 14/78/2956 80  <150 mg/dL Final   LDL Cholesterol (Calc) 12/22/2022 113 (H)  mg/dL (calc) Final   Comment: Reference range: <100 . Desirable range <100 mg/dL for primary prevention;   <70 mg/dL for patients with CHD or diabetic patients  with > or = 2 CHD risk factors. Marland Kitchen LDL-C is now calculated using the Martin-Hopkins  calculation, which is a validated novel method providing  better accuracy than the Friedewald equation in the  estimation of LDL-C.  Horald Pollen et al. Lenox Ahr. 2130;865(78): 2061-2068  (http://education.[facility].com/faq/FAQ164)    Total CHOL/HDL Ratio 12/22/2022 3.7  <4.6 (calc) Final   Non-HDL Cholesterol (Calc) 12/22/2022 130 (H)  <130 mg/dL (calc) Final   Comment: For patients with diabetes plus 1 major ASCVD risk  factor, treating to a non-HDL-C goal of <100 mg/dL  (LDL-C of <96 mg/dL) is considered a therapeutic  option.    Creatinine, Urine 12/22/2022 105  20 - 275 mg/dL Final   Microalb, Ur 29/52/8413 1.0  mg/dL Final   Comment: Reference Range Not established    Microalb Creat Ratio 12/22/2022 10  <30 mg/g creat Final   Comment: . The ADA defines abnormalities in albumin excretion as follows: Marland Kitchen Albuminuria Category        Result (mg/g creatinine) . Normal to Mildly increased   <30 Moderately increased         30-299  Severely increased           > OR = 300 . The ADA recommends that at least two of three specimens collected within a 3-6 month period be abnormal before considering a patient to be within a diagnostic category.    Vitamin B-12  12/22/2022 1,036  200 - 1,100 pg/mL Final    Past Medical History:  Diagnosis Date   Abdominal pain    Anemia    B12 deficiency    Biliary colic    Constipation    occasionally not chronic per pt. -no medicines for this    Diabetes mellitus 12 yrs ago  Hyperlipidemia    Hypertension    Pernicious anemia    Unexplained weight loss    Vomiting    Weakness    Past Surgical History:  Procedure Laterality Date   CESAREAN SECTION  11/29/1990, 04/06/1994   COLONOSCOPY  09/01/2004   all normal - in epic   KNEE SURGERY  11/2000   right   MOUTH SURGERY  04/04/1987   UPPER GASTROINTESTINAL ENDOSCOPY  09-01-04   gastric polyp- m.johnson- in epic   WISDOM TOOTH EXTRACTION     Current Outpatient Medications on File Prior to Visit  Medication Sig Dispense Refill   aspirin 81 MG tablet Take 81 mg by mouth daily.     atorvastatin (LIPITOR) 20 MG tablet TAKE 1 TABLET BY MOUTH EVERY DAY 30 tablet 0   Blood Glucose Monitoring Suppl (ONE TOUCH ULTRA 2) W/DEVICE KIT Use to check blood sugar 3 times per day dx code E11.65 1 each 0   calcium carbonate (OSCAL) 1500 (600 Ca) MG TABS tablet Take by mouth 2 (two) times daily with a meal.     Calcium Carbonate-Vit D-Min (CALCIUM 1200 PO) Take by mouth.     cholecalciferol (VITAMIN D3) 25 MCG (1000 UNIT) tablet Take 1,000 Units by mouth daily.     Dulaglutide (TRULICITY) 0.75 MG/0.5ML SOPN Inject 0.75 mg into the skin once a week. 2 mL 5   empagliflozin (JARDIANCE) 25 MG TABS tablet 1/2 TABLET DAILY 45 tablet 0   ferrous sulfate 325 (65 FE) MG EC tablet Take 325 mg by mouth daily. Take 1 tablet by mouth daily.     losartan (COZAAR) 100 MG tablet TAKE 1 TABLET BY MOUTH EVERY DAY 90 tablet 1   metFORMIN (GLUCOPHAGE) 1000 MG tablet Take 1 tablet (1,000 mg total) by mouth 2 (two) times daily with a meal. 180 tablet 3   OneTouch Delica Lancets 33G MISC USE AS INSTRUCTED 100 each 12   ONETOUCH ULTRA test strip USE AS DIRECTED 100 strip 12   vitamin B-12  (CYANOCOBALAMIN) 500 MCG tablet Take 1,000 mcg by mouth daily. Take 1 tablet by mouth daily.     No current facility-administered medications on file prior to visit.   Allergies  Allergen Reactions   Penicillins Hives    Back of neck and upper back.   Social History   Socioeconomic History   Marital status: Married    Spouse name: Not on file   Number of children: Not on file   Years of education: Not on file   Highest education level: Not on file  Occupational History   Not on file  Tobacco Use   Smoking status: Never   Smokeless tobacco: Never  Vaping Use   Vaping status: Never Used  Substance and Sexual Activity   Alcohol use: No    Alcohol/week: 0.0 standard drinks of alcohol   Drug use: No   Sexual activity: Not Currently  Other Topics Concern   Not on file  Social History Narrative   Not on file   Social Determinants of Health   Financial Resource Strain: Not on file  Food Insecurity: Not on file  Transportation Needs: Not on file  Physical Activity: Not on file  Stress: Not on file  Social Connections: Not on file  Intimate Partner Violence: Not on file   Family History  Problem Relation Age of Onset   Cancer Mother        breast   Breast cancer Mother 3   Diabetes Father  Thyroid disease Sister    Hypertension Neg Hx    Colon cancer Neg Hx    Colon polyps Neg Hx    Rectal cancer Neg Hx    Stomach cancer Neg Hx       Review of Systems     Objective:   Physical Exam Vitals reviewed.  HENT:     Head: Normocephalic and atraumatic.     Right Ear: External ear normal.     Left Ear: External ear normal.     Nose: Nose normal.     Mouth/Throat:     Pharynx: No oropharyngeal exudate.  Eyes:     General: No scleral icterus.       Right eye: No discharge.        Left eye: No discharge.     Conjunctiva/sclera: Conjunctivae normal.     Pupils: Pupils are equal, round, and reactive to light.  Cardiovascular:     Rate and Rhythm: Normal rate  and regular rhythm.     Heart sounds: Normal heart sounds. No murmur heard.    No friction rub. No gallop.  Pulmonary:     Effort: Pulmonary effort is normal. No respiratory distress.     Breath sounds: Normal breath sounds. No stridor. No wheezing or rales.  Chest:     Chest wall: No tenderness.  Abdominal:     General: Bowel sounds are normal. There is no distension.     Palpations: Abdomen is soft. There is no mass.     Tenderness: There is no abdominal tenderness. There is no guarding or rebound.     Hernia: No hernia is present.  Musculoskeletal:        General: No tenderness or deformity. Normal range of motion.     Cervical back: Normal range of motion and neck supple.  Lymphadenopathy:     Cervical: No cervical adenopathy.  Skin:    General: Skin is warm and dry.     Capillary Refill: Capillary refill takes less than 2 seconds.     Coloration: Skin is not pale.     Findings: No erythema or rash.  Neurological:     Mental Status: She is alert and oriented to person, place, and time.     Cranial Nerves: No cranial nerve deficit.     Sensory: No sensory deficit.     Coordination: Coordination normal.     Deep Tendon Reflexes: Reflexes normal.    Lab on 12/22/2022  Component Date Value Ref Range Status   WBC 12/22/2022 6.0  3.8 - 10.8 Thousand/uL Final   RBC 12/22/2022 4.39  3.80 - 5.10 Million/uL Final   Hemoglobin 12/22/2022 11.5 (L)  11.7 - 15.5 g/dL Final   HCT 16/03/9603 36.6  35.0 - 45.0 % Final   MCV 12/22/2022 83.4  80.0 - 100.0 fL Final   MCH 12/22/2022 26.2 (L)  27.0 - 33.0 pg Final   MCHC 12/22/2022 31.4 (L)  32.0 - 36.0 g/dL Final   RDW 54/02/8118 14.1  11.0 - 15.0 % Final   Platelets 12/22/2022 296  140 - 400 Thousand/uL Final   MPV 12/22/2022 10.9  7.5 - 12.5 fL Final   Neutro Abs 12/22/2022 3,672  1,500 - 7,800 cells/uL Final   Lymphs Abs 12/22/2022 1,362  850 - 3,900 cells/uL Final   Absolute Monocytes 12/22/2022 684  200 - 950 cells/uL Final    Eosinophils Absolute 12/22/2022 240  15 - 500 cells/uL Final   Basophils Absolute 12/22/2022 42  0 -  200 cells/uL Final   Neutrophils Relative % 12/22/2022 61.2  % Final   Total Lymphocyte 12/22/2022 22.7  % Final   Monocytes Relative 12/22/2022 11.4  % Final   Eosinophils Relative 12/22/2022 4.0  % Final   Basophils Relative 12/22/2022 0.7  % Final   Glucose, Bld 12/22/2022 95  65 - 99 mg/dL Final   Comment: .            Fasting reference interval .    BUN 12/22/2022 21  7 - 25 mg/dL Final   Creat 16/03/9603 1.13 (H)  0.50 - 1.05 mg/dL Final   eGFR 54/02/8118 55 (L)  > OR = 60 mL/min/1.44m2 Final   BUN/Creatinine Ratio 12/22/2022 19  6 - 22 (calc) Final   Sodium 12/22/2022 137  135 - 146 mmol/L Final   Potassium 12/22/2022 4.5  3.5 - 5.3 mmol/L Final   Chloride 12/22/2022 101  98 - 110 mmol/L Final   CO2 12/22/2022 27  20 - 32 mmol/L Final   Calcium 12/22/2022 10.0  8.6 - 10.4 mg/dL Final   Total Protein 14/78/2956 7.9  6.1 - 8.1 g/dL Final   Albumin 21/30/8657 4.3  3.6 - 5.1 g/dL Final   Globulin 84/69/6295 3.6  1.9 - 3.7 g/dL (calc) Final   AG Ratio 12/22/2022 1.2  1.0 - 2.5 (calc) Final   Total Bilirubin 12/22/2022 0.5  0.2 - 1.2 mg/dL Final   Alkaline phosphatase (APISO) 12/22/2022 70  37 - 153 U/L Final   AST 12/22/2022 18  10 - 35 U/L Final   ALT 12/22/2022 13  6 - 29 U/L Final   Hgb A1c MFr Bld 12/22/2022 6.7 (H)  <5.7 % of total Hgb Final   Comment: For someone without known diabetes, a hemoglobin A1c value of 6.5% or greater indicates that they may have  diabetes and this should be confirmed with a follow-up  test. . For someone with known diabetes, a value <7% indicates  that their diabetes is well controlled and a value  greater than or equal to 7% indicates suboptimal  control. A1c targets should be individualized based on  duration of diabetes, age, comorbid conditions, and  other considerations. . Currently, no consensus exists regarding use of hemoglobin A1c  for diagnosis of diabetes for children. .    Mean Plasma Glucose 12/22/2022 146  mg/dL Final   eAG (mmol/L) 28/41/3244 8.1  mmol/L Final   Comment: . This test was performed on the Roche cobas c503 platform. Effective 03/23/22, a change in test platforms from the Abbott Architect to the Roche cobas c503 may have shifted HbA1c results compared to historical results. Based on laboratory validation testing conducted at [facility], the Roche platform relative to the Abbott platform had an average increase in HbA1c value of < or = 0.3%. This difference is within accepted  variability established by the [facility]. Note that not all individuals will have had a shift in their results and direct comparisons between historical and current results for testing conducted on different platforms is not recommended.    Cholesterol 12/22/2022 179  <200 mg/dL Final   HDL 06/17/7251 49 (L)  > OR = 50 mg/dL Final   Triglycerides 66/44/0347 80  <150 mg/dL Final   LDL Cholesterol (Calc) 12/22/2022 113 (H)  mg/dL (calc) Final   Comment: Reference range: <100 . Desirable range <100 mg/dL for primary prevention;   <70 mg/dL for patients with CHD or diabetic patients  with > or = 2  CHD risk factors. Marland Kitchen LDL-C is now calculated using the Martin-Hopkins  calculation, which is a validated novel method providing  better accuracy than the Friedewald equation in the  estimation of LDL-C.  Horald Pollen et al. Lenox Ahr. 1610;960(45): 2061-2068  (http://education.[facility].com/faq/FAQ164)    Total CHOL/HDL Ratio 12/22/2022 3.7  <4.0 (calc) Final   Non-HDL Cholesterol (Calc) 12/22/2022 130 (H)  <130 mg/dL (calc) Final   Comment: For patients with diabetes plus 1 major ASCVD risk  factor, treating to a non-HDL-C goal of <100 mg/dL  (LDL-C of <98 mg/dL) is considered a therapeutic  option.    Creatinine, Urine 12/22/2022 105  20 - 275 mg/dL Final   Microalb, Ur 11/91/4782 1.0   mg/dL Final   Comment: Reference Range Not established    Microalb Creat Ratio 12/22/2022 10  <30 mg/g creat Final   Comment: . The ADA defines abnormalities in albumin excretion as follows: Marland Kitchen Albuminuria Category        Result (mg/g creatinine) . Normal to Mildly increased   <30 Moderately increased         30-299  Severely increased           > OR = 300 . The ADA recommends that at least two of three specimens collected within a 3-6 month period be abnormal before considering a patient to be within a diagnostic category.    Vitamin B-12 12/22/2022 1,036  200 - 1,100 pg/mL Final          Assessment & Plan:  Type 2 diabetes mellitus with hyperlipidemia (HCC)  Essential hypertension, benign Diabetes test is much better.  Increase Trulicity to 1.5 mg subcu weekly to achieve A1c less than 6.5.  Blood pressure is at goal.  Increase Lipitor to 40 mg a day to achieve an LDL cholesterol less than 956.

## 2022-12-29 ENCOUNTER — Other Ambulatory Visit: Payer: Self-pay | Admitting: Endocrinology

## 2023-01-18 ENCOUNTER — Other Ambulatory Visit: Payer: Self-pay | Admitting: Family Medicine

## 2023-01-19 NOTE — Telephone Encounter (Signed)
Requested medication (s) are due for refill today: routing for review  Requested medication (s) are on the active medication list: yes  Last refill:  10/01/22 and 07/06/22  Future visit scheduled: no  Notes to clinic:  Unable to refill per protocol, last refill by another provider.      Requested Prescriptions  Pending Prescriptions Disp Refills   losartan (COZAAR) 100 MG tablet [Pharmacy Med Name: LOSARTAN POTASSIUM 100 MG TAB] 90 tablet 1    Sig: TAKE 1 TABLET BY MOUTH EVERY DAY     There is no refill protocol information for this order     JARDIANCE 25 MG TABS tablet [Pharmacy Med Name: JARDIANCE 25 MG TABLET] 45 tablet 0    Sig: TAKE 1/2 TABLET BY MOUTH DAILY     There is no refill protocol information for this order

## 2023-01-26 ENCOUNTER — Telehealth: Payer: Self-pay

## 2023-01-26 ENCOUNTER — Other Ambulatory Visit: Payer: Self-pay

## 2023-01-26 DIAGNOSIS — E1169 Type 2 diabetes mellitus with other specified complication: Secondary | ICD-10-CM

## 2023-01-26 DIAGNOSIS — I10 Essential (primary) hypertension: Secondary | ICD-10-CM

## 2023-01-26 MED ORDER — EMPAGLIFLOZIN 25 MG PO TABS: ORAL_TABLET | ORAL | 1 refills | Status: DC

## 2023-01-26 MED ORDER — LOSARTAN POTASSIUM 100 MG PO TABS: 100.0000 mg | ORAL_TABLET | Freq: Every day | ORAL | 1 refills | Status: DC

## 2023-01-26 NOTE — Telephone Encounter (Signed)
Pt came in to request a refill of these meds; Pt states that these meds are listed under another provider that she no longer sees. Pt would like to know if pcp would refill these meds.  LOV: 12/25/22  PHARMACY: [id number] [place], [place] - [address] [place] [address] Phone: [phone]  Fax: [phone]  CB#: [phone]  losartan (COZAAR) 100 MG tablet [[id number]] empagliflozin (JARDIANCE) 25 MG TABS tablet [[id number]].

## 2023-03-03 ENCOUNTER — Telehealth: Payer: Self-pay

## 2023-03-03 NOTE — Telephone Encounter (Signed)
PA FOR TRULICITY SENT TO INSURANCE: Tache Toppins (Key: [id number])  Your information has been submitted to [employer]. To check for an updated outcome later, reopen this PA request from your dashboard.  If [employer] has not responded to your request within 24 hours, contact [employer] at [phone]. If you think there may be a problem with your PA request, use our live chat feature at the bottom right.

## 2023-03-25 ENCOUNTER — Other Ambulatory Visit: Payer: Self-pay | Admitting: Family Medicine

## 2023-03-25 DIAGNOSIS — Z1231 Encounter for screening mammogram for malignant neoplasm of breast: Secondary | ICD-10-CM

## 2023-05-06 ENCOUNTER — Ambulatory Visit: Payer: BC Managed Care – PPO

## 2023-06-03 ENCOUNTER — Ambulatory Visit
Admission: RE | Admit: 2023-06-03 | Discharge: 2023-06-03 | Disposition: A | Discharge status: Home / Self Care | Payer: BC Managed Care – PPO | Source: Ambulatory Visit | Attending: Family Medicine | Admitting: Family Medicine

## 2023-06-03 DIAGNOSIS — Z1231 Encounter for screening mammogram for malignant neoplasm of breast: Secondary | ICD-10-CM

## 2023-06-23 ENCOUNTER — Other Ambulatory Visit: Payer: Self-pay

## 2023-06-23 ENCOUNTER — Other Ambulatory Visit: Payer: BC Managed Care – PPO

## 2023-06-23 DIAGNOSIS — E1169 Type 2 diabetes mellitus with other specified complication: Secondary | ICD-10-CM

## 2023-06-23 DIAGNOSIS — I10 Essential (primary) hypertension: Secondary | ICD-10-CM

## 2023-06-23 DIAGNOSIS — D539 Nutritional anemia, unspecified: Secondary | ICD-10-CM

## 2023-06-24 LAB — CBC WITH DIFFERENTIAL/PLATELET
Absolute Lymphocytes: 1366 {cells}/uL (ref 850–3900)
Absolute Monocytes: 555 {cells}/uL (ref 200–950)
Basophils Absolute: 61 {cells}/uL (ref 0–200)
Basophils Relative: 1 %
Eosinophils Absolute: 79 {cells}/uL (ref 15–500)
Eosinophils Relative: 1.3 %
HCT: 35.5 % (ref 35.0–45.0)
Hemoglobin: 11 g/dL — ABNORMAL LOW (ref 11.7–15.5)
MCH: 26.6 pg — ABNORMAL LOW (ref 27.0–33.0)
MCHC: 31 g/dL — ABNORMAL LOW (ref 32.0–36.0)
MCV: 85.7 fL (ref 80.0–100.0)
MPV: 11 fL (ref 7.5–12.5)
Monocytes Relative: 9.1 %
Neutro Abs: 4038 {cells}/uL (ref 1500–7800)
Neutrophils Relative %: 66.2 %
Platelets: 313 10*3/uL (ref 140–400)
RBC: 4.14 10*6/uL (ref 3.80–5.10)
RDW: 14.3 % (ref 11.0–15.0)
Total Lymphocyte: 22.4 %
WBC: 6.1 10*3/uL (ref 3.8–10.8)

## 2023-06-24 LAB — COMPLETE METABOLIC PANEL WITH GFR
AG Ratio: 1.3 (calc) (ref 1.0–2.5)
ALT: 10 U/L (ref 6–29)
AST: 17 U/L (ref 10–35)
Albumin: 4.5 g/dL (ref 3.6–5.1)
Alkaline phosphatase (APISO): 64 U/L (ref 37–153)
BUN: 16 mg/dL (ref 7–25)
CO2: 28 mmol/L (ref 20–32)
Calcium: 10.2 mg/dL (ref 8.6–10.4)
Chloride: 104 mmol/L (ref 98–110)
Creat: 0.99 mg/dL (ref 0.50–1.05)
Globulin: 3.4 g/dL (ref 1.9–3.7)
Glucose, Bld: 91 mg/dL (ref 65–99)
Potassium: 4.7 mmol/L (ref 3.5–5.3)
Sodium: 141 mmol/L (ref 135–146)
Total Bilirubin: 0.6 mg/dL (ref 0.2–1.2)
Total Protein: 7.9 g/dL (ref 6.1–8.1)
eGFR: 64 mL/min/{1.73_m2} (ref >=60)

## 2023-06-24 LAB — LIPID PANEL
Cholesterol: 169 mg/dL (ref <200)
HDL: 52 mg/dL (ref >=50)
LDL Cholesterol (Calc): 102 mg/dL — ABNORMAL HIGH
Non-HDL Cholesterol (Calc): 117 mg/dL (ref <130)
Total CHOL/HDL Ratio: 3.3 (calc) (ref <5.0)
Triglycerides: 60 mg/dL (ref <150)

## 2023-06-24 LAB — HEMOGLOBIN A1C
Hgb A1c MFr Bld: 6.7 %{Hb} — ABNORMAL HIGH (ref <5.7)
Mean Plasma Glucose: 146 mg/dL
eAG (mmol/L): 8.1 mmol/L

## 2023-06-28 ENCOUNTER — Ambulatory Visit (INDEPENDENT_AMBULATORY_CARE_PROVIDER_SITE_OTHER): Payer: 59 | Admitting: Family Medicine

## 2023-06-28 ENCOUNTER — Encounter: Payer: Self-pay | Admitting: Family Medicine

## 2023-06-28 VITALS — BP 132/82 | HR 91 | Temp 98.0°F | Ht 65.0 in | Wt 146.0 lb

## 2023-06-28 DIAGNOSIS — I10 Essential (primary) hypertension: Secondary | ICD-10-CM

## 2023-06-28 DIAGNOSIS — E785 Hyperlipidemia, unspecified: Secondary | ICD-10-CM | POA: Diagnosis not present

## 2023-06-28 DIAGNOSIS — Z7984 Long term (current) use of oral hypoglycemic drugs: Secondary | ICD-10-CM

## 2023-06-28 DIAGNOSIS — E1169 Type 2 diabetes mellitus with other specified complication: Secondary | ICD-10-CM | POA: Diagnosis not present

## 2023-06-28 MED ORDER — LOSARTAN POTASSIUM 100 MG PO TABS: 100.0000 mg | ORAL_TABLET | Freq: Every day | ORAL | 3 refills | Status: DC

## 2023-06-28 MED ORDER — ONETOUCH ULTRA VI STRP: 1.0000 | ORAL_STRIP | 12 refills | Status: DC

## 2023-06-28 NOTE — Progress Notes (Signed)
 Subjective:    Patient ID: Sherry Hart, female    DOB: 08-31-1960, 63 y.o.   MRN: [id number]  HPI Patient is here today for a follow-up of her diabetes.  She is due for a flu shot which she politely declines.  Her blood pressure today is excellent.  She denies any chest pain or shortness of breath or dyspnea on exertion.  Diabetic foot exam was performed today and is normal.  Her lab work is listed below.  Hemoglobin A1c remains acceptable at 6.7.  Hemoglobin is low at 11.  However I reviewed her hemoglobin values for the last 5 years.  She typically ranges between 11 and 12.  Therefore this is very consistent for her.  Orders Only on 06/23/2023  Component Date Value Ref Range Status   Hgb A1c MFr Bld 06/23/2023 6.7 (H)  <5.7 % of total Hgb Final   Comment: For someone without known diabetes, a hemoglobin A1c value of 6.5% or greater indicates that they may have  diabetes and this should be confirmed with a follow-up  test. . For someone with known diabetes, a value <7% indicates  that their diabetes is well controlled and a value  greater than or equal to 7% indicates suboptimal  control. A1c targets should be individualized based on  duration of diabetes, age, comorbid conditions, and  other considerations. . Currently, no consensus exists regarding use of hemoglobin A1c for diagnosis of diabetes for children. .    Mean Plasma Glucose 06/23/2023 146  mg/dL Final   eAG (mmol/L) 98/91/7974 8.1  mmol/L Final   WBC 06/23/2023 6.1  3.8 - 10.8 Thousand/uL Final   RBC 06/23/2023 4.14  3.80 - 5.10 Million/uL Final   Hemoglobin 06/23/2023 11.0 (L)  11.7 - 15.5 g/dL Final   HCT 98/91/7974 35.5  35.0 - 45.0 % Final   MCV 06/23/2023 85.7  80.0 - 100.0 fL Final   MCH 06/23/2023 26.6 (L)  27.0 - 33.0 pg Final   MCHC 06/23/2023 31.0 (L)  32.0 - 36.0 g/dL Final   Comment: For adults, a slight decrease in the calculated MCHC value (in the range of 30 to 32 g/dL) is most  likely not clinically significant; however, it should be interpreted with caution in correlation with other red cell parameters and the patient's clinical condition.    RDW 06/23/2023 14.3  11.0 - 15.0 % Final   Platelets 06/23/2023 313  140 - 400 Thousand/uL Final   MPV 06/23/2023 11.0  7.5 - 12.5 fL Final   Neutro Abs 06/23/2023 4,038  1,500 - 7,800 cells/uL Final   Absolute Lymphocytes 06/23/2023 1,366  850 - 3,900 cells/uL Final   Absolute Monocytes 06/23/2023 555  200 - 950 cells/uL Final   Eosinophils Absolute 06/23/2023 79  15 - 500 cells/uL Final   Basophils Absolute 06/23/2023 61  0 - 200 cells/uL Final   Neutrophils Relative % 06/23/2023 66.2  % Final   Total Lymphocyte 06/23/2023 22.4  % Final   Monocytes Relative 06/23/2023 9.1  % Final   Eosinophils Relative 06/23/2023 1.3  % Final   Basophils Relative 06/23/2023 1.0  % Final   Glucose, Bld 06/23/2023 91  65 - 99 mg/dL Final   Comment: .            Fasting reference interval .    BUN 06/23/2023 16  7 - 25 mg/dL Final   Creat 98/91/7974 0.99  0.50 - 1.05 mg/dL Final   eGFR 98/91/7974 64  >  OR = 60 mL/min/1.47m2 Final   BUN/Creatinine Ratio 06/23/2023 SEE NOTE:  6 - 22 (calc) Final   Comment:    Not Reported: BUN and Creatinine are within    reference range. .    Sodium 06/23/2023 141  135 - 146 mmol/L Final   Potassium 06/23/2023 4.7  3.5 - 5.3 mmol/L Final   Chloride 06/23/2023 104  98 - 110 mmol/L Final   CO2 06/23/2023 28  20 - 32 mmol/L Final   Calcium  06/23/2023 10.2  8.6 - 10.4 mg/dL Final   Total Protein 98/91/7974 7.9  6.1 - 8.1 g/dL Final   Albumin 98/91/7974 4.5  3.6 - 5.1 g/dL Final   Globulin 98/91/7974 3.4  1.9 - 3.7 g/dL (calc) Final   AG Ratio 06/23/2023 1.3  1.0 - 2.5 (calc) Final   Total Bilirubin 06/23/2023 0.6  0.2 - 1.2 mg/dL Final   Alkaline phosphatase (APISO) 06/23/2023 64  37 - 153 U/L Final   AST 06/23/2023 17  10 - 35 U/L Final   ALT 06/23/2023 10  6 - 29 U/L Final   Cholesterol  06/23/2023 169  <200 mg/dL Final   HDL 98/91/7974 52  > OR = 50 mg/dL Final   Triglycerides 98/91/7974 60  <150 mg/dL Final   LDL Cholesterol (Calc) 06/23/2023 102 (H)  mg/dL (calc) Final   Comment: Reference range: <100 . Desirable range <100 mg/dL for primary prevention;   <70 mg/dL for patients with CHD or diabetic patients  with > or = 2 CHD risk factors. SABRA LDL-C is now calculated using the Martin-Hopkins  calculation, which is a validated novel method providing  better accuracy than the Friedewald equation in the  estimation of LDL-C.  Gladis APPLETHWAITE et al. SANDREA. 7986;689(80): 2061-2068  (http://education.[facility].com/faq/FAQ164)    Total CHOL/HDL Ratio 06/23/2023 3.3  <4.9 (calc) Final   Non-HDL Cholesterol (Calc) 06/23/2023 117  <130 mg/dL (calc) Final   Comment: For patients with diabetes plus 1 major ASCVD risk  factor, treating to a non-HDL-C goal of <100 mg/dL  (LDL-C of <29 mg/dL) is considered a therapeutic  option.     Past Medical History:  Diagnosis Date   Abdominal pain    Anemia    B12 deficiency    Biliary colic    Constipation    occasionally not chronic per pt. -no medicines for this    Diabetes mellitus 12 yrs ago    Hyperlipidemia    Hypertension    Pernicious anemia    Unexplained weight loss    Vomiting    Weakness    Past Surgical History:  Procedure Laterality Date   CESAREAN SECTION  11/29/1990, 04/06/1994   COLONOSCOPY  09/01/2004   all normal - in epic   KNEE SURGERY  11/2000   right   MOUTH SURGERY  04/04/1987   UPPER GASTROINTESTINAL ENDOSCOPY  09-01-04   gastric polyp- m.johnson- in epic   WISDOM TOOTH EXTRACTION     Current Outpatient Medications on File Prior to Visit  Medication Sig Dispense Refill   aspirin  81 MG tablet Take 81 mg by mouth daily.     atorvastatin  (LIPITOR) 40 MG tablet Take 1 tablet (40 mg total) by mouth daily. 90 tablet 3   Calcium  Carbonate-Vit D-Min (CALCIUM  1200 PO) Take by mouth.     cholecalciferol  (VITAMIN D3) 25 MCG (1000 UNIT) tablet Take 1,000 Units by mouth daily.     Dulaglutide  (TRULICITY ) 1.5 MG/0.5ML SOPN Inject 1.5 mg into the skin once a  week. 6 mL 3   empagliflozin  (JARDIANCE ) 25 MG TABS tablet 1/2 TABLET DAILY 135 tablet 1   ferrous sulfate 325 (65 FE) MG EC tablet Take 325 mg by mouth daily. Take 1 tablet by mouth daily.     losartan  (COZAAR ) 100 MG tablet Take 1 tablet (100 mg total) by mouth daily. 90 tablet 1   metFORMIN  (GLUCOPHAGE ) 1000 MG tablet Take 1 tablet (1,000 mg total) by mouth 2 (two) times daily with a meal. 180 tablet 3   OneTouch Delica Lancets 33G MISC USE AS INSTRUCTED 100 each 12   ONETOUCH ULTRA test strip USE AS DIRECTED 100 strip 12   vitamin B-12 (CYANOCOBALAMIN ) 500 MCG tablet Take 1,000 mcg by mouth daily. Take 1 tablet by mouth daily.     No current facility-administered medications on file prior to visit.   Allergies  Allergen Reactions   Penicillins Hives    Back of neck and upper back.   Social History   Socioeconomic History   Marital status: Married    Spouse name: Not on file   Number of children: Not on file   Years of education: Not on file   Highest education level: Not on file  Occupational History   Not on file  Tobacco Use   Smoking status: Never   Smokeless tobacco: Never  Vaping Use   Vaping status: Never Used  Substance and Sexual Activity   Alcohol use: No    Alcohol/week: 0.0 standard drinks of alcohol   Drug use: No   Sexual activity: Not Currently  Other Topics Concern   Not on file  Social History Narrative   Not on file   Social Drivers of Health   Financial Resource Strain: Not on file  Food Insecurity: Not on file  Transportation Needs: Not on file  Physical Activity: Not on file  Stress: Not on file  Social Connections: Not on file  Intimate Partner Violence: Not on file   Family History  Problem Relation Age of Onset   Breast cancer Mother 79   Cancer Mother        breast   Diabetes Father     Thyroid  disease Sister    Hypertension Neg Hx    Colon cancer Neg Hx    Colon polyps Neg Hx    Rectal cancer Neg Hx    Stomach cancer Neg Hx       Review of Systems     Objective:   Physical Exam Vitals reviewed.  HENT:     Head: Normocephalic and atraumatic.     Right Ear: External ear normal.     Left Ear: External ear normal.     Nose: Nose normal.     Mouth/Throat:     Pharynx: No oropharyngeal exudate.  Eyes:     General: No scleral icterus.       Right eye: No discharge.        Left eye: No discharge.     Conjunctiva/sclera: Conjunctivae normal.     Pupils: Pupils are equal, round, and reactive to light.  Cardiovascular:     Rate and Rhythm: Normal rate and regular rhythm.     Heart sounds: Normal heart sounds. No murmur heard.    No friction rub. No gallop.  Pulmonary:     Effort: Pulmonary effort is normal. No respiratory distress.     Breath sounds: Normal breath sounds. No stridor. No wheezing or rales.  Chest:     Chest wall: No  tenderness.  Abdominal:     General: Bowel sounds are normal. There is no distension.     Palpations: Abdomen is soft. There is no mass.     Tenderness: There is no abdominal tenderness. There is no guarding or rebound.     Hernia: No hernia is present.  Musculoskeletal:        General: No tenderness or deformity. Normal range of motion.     Cervical back: Normal range of motion and neck supple.  Lymphadenopathy:     Cervical: No cervical adenopathy.  Skin:    General: Skin is warm and dry.     Capillary Refill: Capillary refill takes less than 2 seconds.     Coloration: Skin is not pale.     Findings: No erythema or rash.  Neurological:     Mental Status: She is alert and oriented to person, place, and time.     Cranial Nerves: No cranial nerve deficit.     Sensory: No sensory deficit.     Coordination: Coordination normal.     Deep Tendon Reflexes: Reflexes normal.           Assessment & Plan:  Type 2  diabetes mellitus with hyperlipidemia (HCC)  Essential hypertension, benign - Plan: losartan  (COZAAR ) 100 MG tablet I am happy with her blood pressure and her cholesterol.  We discussed switching Trulicity  to Mounjaro to achieve a better hemoglobin A1c.  Patient prefers to continue Trulicity  as she is comfortable with this medication.  She declines a flu shot today.  We also discussed the new pneumonia vaccine which she declines.  He will be due for a colonoscopy this summer.  She will call me when she is ready for me to schedule this.

## 2023-06-30 ENCOUNTER — Other Ambulatory Visit: Payer: Self-pay

## 2023-06-30 DIAGNOSIS — E1165 Type 2 diabetes mellitus with hyperglycemia: Secondary | ICD-10-CM

## 2023-06-30 MED ORDER — ONETOUCH DELICA LANCETS 33G MISC: 12 refills | Status: AC

## 2023-07-02 ENCOUNTER — Other Ambulatory Visit: Payer: Self-pay | Admitting: Family Medicine

## 2023-07-05 ENCOUNTER — Other Ambulatory Visit: Payer: Self-pay

## 2023-07-05 DIAGNOSIS — E1165 Type 2 diabetes mellitus with hyperglycemia: Secondary | ICD-10-CM

## 2023-07-05 MED ORDER — ONETOUCH ULTRA VI STRP: ORAL_STRIP | 12 refills | Status: AC

## 2023-07-05 NOTE — Telephone Encounter (Signed)
Requested medication (s) are due for refill today:   Yes  Requested medication (s) are on the active medication list:   Yes  Future visit scheduled:   Yes 12/27/2023    LOV 06/28/2023   Last ordered: 06/28/2023 #100, 12 refills  Returned because pharmacy needs rx rewritten.   See pharmacy message.     Requested Prescriptions  Pending Prescriptions Disp Refills   ONETOUCH ULTRA test strip [Pharmacy Med Name: ONE TOUCH ULTRA BLUE TEST STRP] 100 strip 12    Sig: 1 each by Other route See admin instructions. Use as instructed     Endocrinology: Diabetes - Testing Supplies Failed - 07/05/2023  7:42 AM      Failed - Valid encounter within last 12 months    Recent Outpatient Visits           2 years ago Type 2 diabetes mellitus with hyperlipidemia (HCC)   [facility] Pickard, Priscille Heidelberg, MD   3 years ago General medical exam   [facility] Donita Brooks, MD   5 years ago Iron deficiency anemia, unspecified iron deficiency anemia type   [facility] Donita Brooks, MD   5 years ago Anemia, unspecified type   [facility] Donita Brooks, MD   6 years ago Ganglion cyst   [facility] Pickard, Priscille Heidelberg, MD

## 2023-07-21 ENCOUNTER — Other Ambulatory Visit: Payer: Self-pay | Admitting: Family Medicine

## 2023-07-21 NOTE — Telephone Encounter (Signed)
 Requested by interface surescripts. Last labs 06/23/23 last OV 06/28/23.  Requested Prescriptions  Pending Prescriptions Disp Refills   metFORMIN  (GLUCOPHAGE ) 1000 MG tablet [Pharmacy Med Name: METFORMIN  HCL 1,000 MG TABLET] 180 tablet 0    Sig: TAKE 1 TABLET (1,000 MG TOTAL) BY MOUTH TWICE A DAY WITH FOOD     Endocrinology:  Diabetes - Biguanides Failed - 07/21/2023  9:36 AM      Failed - Valid encounter within last 6 months    Recent Outpatient Visits           2 years ago Type 2 diabetes mellitus with hyperlipidemia (HCC)   [facility] Pickard, Butler DASEN, MD   3 years ago General medical exam   [facility] Duanne Butler DASEN, MD   5 years ago Iron deficiency anemia, unspecified iron deficiency anemia type   [facility] Duanne Butler DASEN, MD   5 years ago Anemia, unspecified type   [facility] Duanne Butler DASEN, MD   6 years ago Ganglion cyst   [facility] Pickard, Butler DASEN, MD              Passed - Cr in normal range and within 360 days    Creat  Date Value Ref Range Status  06/23/2023 0.99 0.50 - 1.05 mg/dL Final   Creatinine,U  Date Value Ref Range Status  12/22/2021 81.0 mg/dL Final   Creatinine, Urine  Date Value Ref Range Status  12/22/2022 105 20 - 275 mg/dL Final         Passed - HBA1C is between 0 and 7.9 and within 180 days    Hgb A1c MFr Bld  Date Value Ref Range Status  06/23/2023 6.7 (H) <5.7 % of total Hgb Final    Comment:    For someone without known diabetes, a hemoglobin A1c value of 6.5% or greater indicates that they may have  diabetes and this should be confirmed with a follow-up  test. . For someone with known diabetes, a value <7% indicates  that their diabetes is well controlled and a value  greater than or equal to 7% indicates suboptimal  control. A1c targets should be individualized based on  duration of diabetes, age, comorbid conditions, and  other  considerations. . Currently, no consensus exists regarding use of hemoglobin A1c for diagnosis of diabetes for children. .          Passed - eGFR in normal range and within 360 days    GFR, Est African American  Date Value Ref Range Status  12/03/2020 63 > OR = 60 mL/min/1.67m2 Final   GFR, Est Non African American  Date Value Ref Range Status  12/03/2020 55 (L) > OR = 60 mL/min/1.33m2 Final   GFR  Date Value Ref Range Status  04/17/2022 51.16 (L) >60.00 mL/min Final    Comment:    Calculated using the CKD-EPI Creatinine Equation (2021)   eGFR  Date Value Ref Range Status  06/23/2023 64 > OR = 60 mL/min/1.86m2 Final         Passed - B12 Level in normal range and within 720 days    Vitamin B-12  Date Value Ref Range Status  12/22/2022 1,036 200 - 1,100 pg/mL Final         Passed - CBC within normal limits and completed in the last 12 months    WBC  Date Value Ref Range Status  06/23/2023 6.1 3.8 - 10.8  Thousand/uL Final   RBC  Date Value Ref Range Status  06/23/2023 4.14 3.80 - 5.10 Million/uL Final   Hemoglobin  Date Value Ref Range Status  06/23/2023 11.0 (L) 11.7 - 15.5 g/dL Final   HCT  Date Value Ref Range Status  06/23/2023 35.5 35.0 - 45.0 % Final   MCHC  Date Value Ref Range Status  06/23/2023 31.0 (L) 32.0 - 36.0 g/dL Final    Comment:    For adults, a slight decrease in the calculated MCHC value (in the range of 30 to 32 g/dL) is most likely not clinically significant; however, it should be interpreted with caution in correlation with other red cell parameters and the patient's clinical condition.    [facility]  Date Value Ref Range Status  06/23/2023 26.6 (L) 27.0 - 33.0 pg Final   MCV  Date Value Ref Range Status  06/23/2023 85.7 80.0 - 100.0 fL Final   No results found for: PLTCOUNTKUC, LABPLAT, POCPLA RDW  Date Value Ref Range Status  06/23/2023 14.3 11.0 - 15.0 % Final

## 2023-10-11 ENCOUNTER — Encounter: Payer: Self-pay | Admitting: Gastroenterology

## 2023-10-18 ENCOUNTER — Other Ambulatory Visit: Payer: Self-pay | Admitting: Family Medicine

## 2023-10-18 NOTE — Telephone Encounter (Signed)
 Prescription Request  10/18/2023  LOV: 06/28/2023  What is the name of the medication or equipment? metFORMIN  (GLUCOPHAGE ) 1000 MG tablet   Have you contacted your pharmacy to request a refill? Yes   Which pharmacy would you like this sent to?  [id number] Jonette Nestle, [place] - [address] [place] [address] Phone: [phone] Fax: [phone]    Patient notified that their request is being sent to the clinical staff for review and that they should receive a response within 2 business days.   Please advise at [facility] [phone]

## 2023-10-20 MED ORDER — METFORMIN HCL 1000 MG PO TABS: 1000.0000 mg | ORAL_TABLET | Freq: Two times a day (BID) | ORAL | 0 refills | Status: DC

## 2023-10-20 NOTE — Telephone Encounter (Signed)
 Requested Prescriptions  Pending Prescriptions Disp Refills   metFORMIN  (GLUCOPHAGE ) 1000 MG tablet 180 tablet 0    Sig: Take 1 tablet (1,000 mg total) by mouth 2 (two) times daily with a meal.     Endocrinology:  Diabetes - Biguanides Failed - 10/20/2023  9:10 AM      Failed - Valid encounter within last 6 months    Recent Outpatient Visits           3 months ago Type 2 diabetes mellitus with hyperlipidemia (HCC)   [facility] Pickard, Cisco Crest, MD   9 months ago Type 2 diabetes mellitus with hyperlipidemia [facility])   [place] [facility] Austine Lefort, MD   1 year ago Type 2 diabetes mellitus with hyperlipidemia [facility])   [place] [facility] Austine Lefort, MD   1 year ago Type 2 diabetes mellitus with hyperlipidemia [facility])    [facility] Pickard, Cisco Crest, MD              Passed - Cr in normal range and within 360 days    Creat  Date Value Ref Range Status  06/23/2023 0.99 0.50 - 1.05 mg/dL Final   Creatinine,U  Date Value Ref Range Status  12/22/2021 81.0 mg/dL Final   Creatinine, Urine  Date Value Ref Range Status  12/22/2022 105 20 - 275 mg/dL Final         Passed - HBA1C is between 0 and 7.9 and within 180 days    Hgb A1c MFr Bld  Date Value Ref Range Status  06/23/2023 6.7 (H) <5.7 % of total Hgb Final    Comment:    For someone without known diabetes, a hemoglobin A1c value of 6.5% or greater indicates that they may have  diabetes and this should be confirmed with a follow-up  test. . For someone with known diabetes, a value <7% indicates  that their diabetes is well controlled and a value  greater than or equal to 7% indicates suboptimal  control. A1c targets should be individualized based on  duration of diabetes, age, comorbid conditions, and  other considerations. . Currently, no consensus exists regarding use of hemoglobin A1c for diagnosis of  diabetes for children. .          Passed - eGFR in normal range and within 360 days    GFR, Est African American  Date Value Ref Range Status  12/03/2020 63 > OR = 60 mL/min/1.39m2 Final   GFR, Est Non African American  Date Value Ref Range Status  12/03/2020 55 (L) > OR = 60 mL/min/1.103m2 Final   GFR  Date Value Ref Range Status  04/17/2022 51.16 (L) >60.00 mL/min Final    Comment:    Calculated using the CKD-EPI Creatinine Equation (2021)   eGFR  Date Value Ref Range Status  06/23/2023 64 > OR = 60 mL/min/1.21m2 Final         Passed - B12 Level in normal range and within 720 days    Vitamin B-12  Date Value Ref Range Status  12/22/2022 1,036 200 - 1,100 pg/mL Final         Passed - CBC within normal limits and completed in the last 12 months    WBC  Date Value Ref Range Status  06/23/2023 6.1 3.8 - 10.8 Thousand/uL Final   RBC  Date Value Ref Range Status  06/23/2023 4.14 3.80 - 5.10 Million/uL Final  Hemoglobin  Date Value Ref Range Status  06/23/2023 11.0 (L) 11.7 - 15.5 g/dL Final   HCT  Date Value Ref Range Status  06/23/2023 35.5 35.0 - 45.0 % Final   MCHC  Date Value Ref Range Status  06/23/2023 31.0 (L) 32.0 - 36.0 g/dL Final    Comment:    For adults, a slight decrease in the calculated MCHC value (in the range of 30 to 32 g/dL) is most likely not clinically significant; however, it should be interpreted with caution in correlation with other red cell parameters and the patient's clinical condition.    [facility]  Date Value Ref Range Status  06/23/2023 26.6 (L) 27.0 - 33.0 pg Final   MCV  Date Value Ref Range Status  06/23/2023 85.7 80.0 - 100.0 fL Final   No results found for: "PLTCOUNTKUC", "LABPLAT", "POCPLA" RDW  Date Value Ref Range Status  06/23/2023 14.3 11.0 - 15.0 % Final

## 2023-12-09 LAB — HM DIABETES EYE EXAM

## 2023-12-21 ENCOUNTER — Ambulatory Visit (AMBULATORY_SURGERY_CENTER)

## 2023-12-21 ENCOUNTER — Encounter: Payer: Self-pay | Admitting: Gastroenterology

## 2023-12-21 VITALS — Ht 65.0 in | Wt 147.0 lb

## 2023-12-21 DIAGNOSIS — Z860100 Personal history of colon polyps, unspecified: Secondary | ICD-10-CM

## 2023-12-21 NOTE — Patient Instructions (Signed)
 ONCE A WEEK INJECTIONS Ozempic,  Mounjaro, Wegovy, Trulicity , Tanzeum, Byetta, Victoza, Bydureon, & SymlinPen  -DO NOT TAKE 7 days prior to the procedure.  Last dose on or before Thursday 7/10 failure to hold this medication will result in a cancellation or rescheduling of your procedure

## 2023-12-21 NOTE — Progress Notes (Signed)
 No egg or soy allergy known to patient  No issues known to pt with past sedation with any surgeries or procedures Patient denies ever being told they had issues or difficulty with intubation  No FH of Malignant Hyperthermia Pt is not on diet pills Pt is not on  home 02  Pt is not on blood thinners  Pt denies issues with constipation  No A fib or A flutter Have any cardiac testing pending-- no  LOA: independent  Prep: suprep    Pt is aware she will need to hold Trulicity  injection after 7/10. She is aware failure to hold will result in cancellation of her procedure.  Patient's chart reviewed by Norleen Schillings CNRA prior to previsit and patient appropriate for the [facility].  Previsit completed and red dot placed by patient's name on their procedure day (on provider's schedule).     PV completed with patient. Prep instructions sent via mychart and home address.

## 2023-12-23 ENCOUNTER — Telehealth: Payer: Self-pay | Admitting: Gastroenterology

## 2023-12-23 NOTE — Telephone Encounter (Signed)
 Inbound call from patient stated she would like a call back from nurse in regards to her prep since her procedure time changed from July 18th at 9:30am to July 18th at 1pm Would like nurse to call home phone [phone] first then try mobile number [phone].  Please advise  Thank you

## 2023-12-23 NOTE — Telephone Encounter (Signed)
 Spoke with the patient. Reviewed the change of prep times with her.  She is waiting for the mailed copy of her instructions. She was invited to come pick up a copy if she needs to. She will begin the 2nd half of her prep at 8:00 am on 12/31/23. She will be NPO after 10:00 am 12/31/23. She will arrive at 1:L00 pm. Patient was prepped over the telephone by Pre-Visit originally.

## 2023-12-24 ENCOUNTER — Other Ambulatory Visit: Payer: 59

## 2023-12-24 DIAGNOSIS — I10 Essential (primary) hypertension: Secondary | ICD-10-CM

## 2023-12-24 DIAGNOSIS — E1169 Type 2 diabetes mellitus with other specified complication: Secondary | ICD-10-CM

## 2023-12-24 DIAGNOSIS — E1165 Type 2 diabetes mellitus with hyperglycemia: Secondary | ICD-10-CM

## 2023-12-27 ENCOUNTER — Encounter: Payer: Self-pay | Admitting: Family Medicine

## 2023-12-27 ENCOUNTER — Ambulatory Visit: Payer: 59 | Admitting: Family Medicine

## 2023-12-27 ENCOUNTER — Ambulatory Visit: Payer: Self-pay | Admitting: Family Medicine

## 2023-12-27 VITALS — BP 142/68 | HR 82 | Temp 97.7°F | Ht 65.0 in | Wt 142.0 lb

## 2023-12-27 DIAGNOSIS — Z7985 Long-term (current) use of injectable non-insulin antidiabetic drugs: Secondary | ICD-10-CM

## 2023-12-27 DIAGNOSIS — D649 Anemia, unspecified: Secondary | ICD-10-CM | POA: Diagnosis not present

## 2023-12-27 DIAGNOSIS — E785 Hyperlipidemia, unspecified: Secondary | ICD-10-CM | POA: Diagnosis not present

## 2023-12-27 DIAGNOSIS — I10 Essential (primary) hypertension: Secondary | ICD-10-CM | POA: Diagnosis not present

## 2023-12-27 DIAGNOSIS — E1169 Type 2 diabetes mellitus with other specified complication: Secondary | ICD-10-CM | POA: Diagnosis not present

## 2023-12-27 NOTE — Progress Notes (Signed)
 Subjective:    Patient ID: Sherry Hart, female    DOB: 1960/11/07, 63 y.o.   MRN: [id number]  HPI Patient is here today for a follow-up of her diabetes.  The patient's hemoglobin continues to trend down to 10.5 despite taking B12 and ferrous sulfate.  She has a colonoscopy scheduled for later this week.  She denies any chest pain or angina.  She denies any abdominal pain or nausea or vomiting.  She denies any heartburn.  Her blood pressure is elevated today however the patient appears slightly anxious which I think may be affecting her blood pressure.   Lab on 12/24/2023  Component Date Value Ref Range Status   Cholesterol 12/24/2023 160  <200 mg/dL Final   HDL 92/88/7974 48 (L)  > OR = 50 mg/dL Final   Triglycerides 92/88/7974 71  <150 mg/dL Final   LDL Cholesterol (Calc) 12/24/2023 96  mg/dL (calc) Final   Comment: Reference range: <100 . Desirable range <100 mg/dL for primary prevention;   <70 mg/dL for patients with CHD or diabetic patients  with > or = 2 CHD risk factors. SABRA LDL-C is now calculated using the Martin-Hopkins  calculation, which is a validated novel method providing  better accuracy than the Friedewald equation in the  estimation of LDL-C.  Gladis APPLETHWAITE et al. SANDREA. 7986;689(80): 2061-2068  (http://education.[facility].com/faq/FAQ164)    Total CHOL/HDL Ratio 12/24/2023 3.3  <4.9 (calc) Final   Non-HDL Cholesterol (Calc) 12/24/2023 112  <130 mg/dL (calc) Final   Comment: For patients with diabetes plus 1 major ASCVD risk  factor, treating to a non-HDL-C goal of <100 mg/dL  (LDL-C of <29 mg/dL) is considered a therapeutic  option.    Glucose, Bld 12/24/2023 86  65 - 99 mg/dL Final   Comment: .            Fasting reference interval .    BUN 12/24/2023 15  7 - 25 mg/dL Final   Creat 92/88/7974 1.04  0.50 - 1.05 mg/dL Final   BUN/Creatinine Ratio 12/24/2023 SEE NOTE:  6 - 22 (calc) Final   Comment:    Not Reported: BUN and Creatinine are  within    reference range. .    Sodium 12/24/2023 138  135 - 146 mmol/L Final   Potassium 12/24/2023 4.9  3.5 - 5.3 mmol/L Final   Chloride 12/24/2023 101  98 - 110 mmol/L Final   CO2 12/24/2023 27  20 - 32 mmol/L Final   Calcium  12/24/2023 9.7  8.6 - 10.4 mg/dL Final   Total Protein 92/88/7974 7.6  6.1 - 8.1 g/dL Final   Albumin 92/88/7974 4.1  3.6 - 5.1 g/dL Final   Globulin 92/88/7974 3.5  1.9 - 3.7 g/dL (calc) Final   AG Ratio 12/24/2023 1.2  1.0 - 2.5 (calc) Final   Total Bilirubin 12/24/2023 0.5  0.2 - 1.2 mg/dL Final   Alkaline phosphatase (APISO) 12/24/2023 71  37 - 153 U/L Final   AST 12/24/2023 17  10 - 35 U/L Final   ALT 12/24/2023 12  6 - 29 U/L Final   WBC 12/24/2023 6.8  3.8 - 10.8 Thousand/uL Final   RBC 12/24/2023 4.02  3.80 - 5.10 Million/uL Final   Hemoglobin 12/24/2023 10.6 (L)  11.7 - 15.5 g/dL Final   HCT 92/88/7974 34.8 (L)  35.0 - 45.0 % Final   MCV 12/24/2023 86.6  80.0 - 100.0 fL Final   MCH 12/24/2023 26.4 (L)  27.0 - 33.0 pg Final   MCHC  12/24/2023 30.5 (L)  32.0 - 36.0 g/dL Final   Comment: For adults, a slight decrease in the calculated MCHC value (in the range of 30 to 32 g/dL) is most likely not clinically significant; however, it should be interpreted with caution in correlation with other red cell parameters and the patient's clinical condition.    RDW 12/24/2023 13.7  11.0 - 15.0 % Final   Platelets 12/24/2023 329  140 - 400 Thousand/uL Final   MPV 12/24/2023 10.8  7.5 - 12.5 fL Final   Neutro Abs 12/24/2023 4,284  1,500 - 7,800 cells/uL Final   Absolute Lymphocytes 12/24/2023 1,482  850 - 3,900 cells/uL Final   Absolute Monocytes 12/24/2023 728  200 - 950 cells/uL Final   Eosinophils Absolute 12/24/2023 258  15 - 500 cells/uL Final   Basophils Absolute 12/24/2023 48  0 - 200 cells/uL Final   Neutrophils Relative % 12/24/2023 63  % Final   Total Lymphocyte 12/24/2023 21.8  % Final   Monocytes Relative 12/24/2023 10.7  % Final   Eosinophils  Relative 12/24/2023 3.8  % Final   Basophils Relative 12/24/2023 0.7  % Final   Hgb A1c MFr Bld 12/24/2023 6.5 (H)  <5.7 % Final   Comment: For someone without known diabetes, a hemoglobin A1c value of 6.5% or greater indicates that they may have  diabetes and this should be confirmed with a follow-up  test. . For someone with known diabetes, a value <7% indicates  that their diabetes is well controlled and a value  greater than or equal to 7% indicates suboptimal  control. A1c targets should be individualized based on  duration of diabetes, age, comorbid conditions, and  other considerations. . Currently, no consensus exists regarding use of hemoglobin A1c for diagnosis of diabetes for children. .    Mean Plasma Glucose 12/24/2023 140  mg/dL Final   eAG (mmol/L) 92/88/7974 7.7  mmol/L Final   Creatinine, Urine 12/24/2023 81  20 - 275 mg/dL Final   Microalb, Ur 92/88/7974 1.1  mg/dL Final   Comment: Reference Range Not established    Microalb Creat Ratio 12/24/2023 14  <30 mg/g creat Final   Comment: . The ADA defines abnormalities in albumin excretion as follows: SABRA Albuminuria Category        Result (mg/g creatinine) . Normal to Mildly increased   <30 Moderately increased         30-299  Severely increased           > OR = 300 . The ADA recommends that at least two of three specimens collected within a 3-6 month period be abnormal before considering a patient to be within a diagnostic category.   Scanned Document on 12/10/2023  Component Date Value Ref Range Status   HM Diabetic Eye Exam 12/09/2023 Retinopathy (A)  No Retinopathy Final   ABSTRACTED BY HIM    Past Medical History:  Diagnosis Date   Abdominal pain    Anemia    B12 deficiency    Biliary colic    Constipation    occasionally not chronic per pt. -no medicines for this    Diabetes mellitus 12 yrs ago    Hyperlipidemia    Hypertension    Pernicious anemia    Unexplained weight loss    Vomiting     Weakness    Past Surgical History:  Procedure Laterality Date   CESAREAN SECTION  11/29/1990, 04/06/1994   COLONOSCOPY  09/01/2004   all normal - in epic  KNEE SURGERY  11/2000   right   MOUTH SURGERY  04/04/1987   UPPER GASTROINTESTINAL ENDOSCOPY  09-01-04   gastric polyp- m.johnson- in epic   WISDOM TOOTH EXTRACTION     Current Outpatient Medications on File Prior to Visit  Medication Sig Dispense Refill   aspirin  81 MG tablet Take 81 mg by mouth daily.     atorvastatin  (LIPITOR) 40 MG tablet Take 1 tablet (40 mg total) by mouth daily. 90 tablet 3   Calcium  Carbonate-Vit D-Min (CALCIUM  1200 PO) Take by mouth.     cholecalciferol (VITAMIN D3) 25 MCG (1000 UNIT) tablet Take 1,000 Units by mouth daily.     Dulaglutide  (TRULICITY ) 1.5 MG/0.5ML SOPN Inject 1.5 mg into the skin once a week. 6 mL 3   empagliflozin  (JARDIANCE ) 25 MG TABS tablet 1/2 TABLET DAILY 135 tablet 1   ferrous sulfate 325 (65 FE) MG EC tablet Take 325 mg by mouth daily. Take 1 tablet by mouth daily.     glucose blood (ONETOUCH ULTRA) test strip Use as directed to check blood sugars twice per day. 100 strip 12   losartan  (COZAAR ) 100 MG tablet Take 1 tablet (100 mg total) by mouth daily. 90 tablet 3   metFORMIN  (GLUCOPHAGE ) 1000 MG tablet Take 1 tablet (1,000 mg total) by mouth 2 (two) times daily with a meal. 180 tablet 0   OneTouch Delica Lancets 33G MISC Use to check blood sugars two times per day. E11.9 100 each 12   vitamin B-12 (CYANOCOBALAMIN ) 500 MCG tablet Take 1,000 mcg by mouth daily. Take 1 tablet by mouth daily.     No current facility-administered medications on file prior to visit.   Allergies  Allergen Reactions   Penicillins Hives    Back of neck and upper back.   Social History   Socioeconomic History   Marital status: Married    Spouse name: Not on file   Number of children: Not on file   Years of education: Not on file   Highest education level: Not on file  Occupational History   Not  on file  Tobacco Use   Smoking status: Never   Smokeless tobacco: Never  Vaping Use   Vaping status: Never Used  Substance and Sexual Activity   Alcohol use: No    Alcohol/week: 0.0 standard drinks of alcohol   Drug use: No   Sexual activity: Not Currently  Other Topics Concern   Not on file  Social History Narrative   Not on file   Social Drivers of Health   Financial Resource Strain: Not on file  Food Insecurity: Not on file  Transportation Needs: Not on file  Physical Activity: Not on file  Stress: Not on file  Social Connections: Not on file  Intimate Partner Violence: Not on file   Family History  Problem Relation Age of Onset   Breast cancer Mother 71   Cancer Mother        breast   Diabetes Father    Thyroid  disease Sister    Hypertension Neg Hx    Colon cancer Neg Hx    Colon polyps Neg Hx    Rectal cancer Neg Hx    Stomach cancer Neg Hx       Review of Systems     Objective:   Physical Exam Vitals reviewed.  HENT:     Head: Normocephalic and atraumatic.     Right Ear: External ear normal.     Left Ear: External ear  normal.     Nose: Nose normal.     Mouth/Throat:     Pharynx: No oropharyngeal exudate.  Eyes:     General: No scleral icterus.       Right eye: No discharge.        Left eye: No discharge.     Conjunctiva/sclera: Conjunctivae normal.     Pupils: Pupils are equal, round, and reactive to light.  Cardiovascular:     Rate and Rhythm: Normal rate and regular rhythm.     Heart sounds: Normal heart sounds. No murmur heard.    No friction rub. No gallop.  Pulmonary:     Effort: Pulmonary effort is normal. No respiratory distress.     Breath sounds: Normal breath sounds. No stridor. No wheezing or rales.  Chest:     Chest wall: No tenderness.  Abdominal:     General: Bowel sounds are normal. There is no distension.     Palpations: Abdomen is soft. There is no mass.     Tenderness: There is no abdominal tenderness. There is no  guarding or rebound.     Hernia: No hernia is present.  Musculoskeletal:        General: No tenderness or deformity. Normal range of motion.     Cervical back: Normal range of motion and neck supple.  Lymphadenopathy:     Cervical: No cervical adenopathy.  Skin:    General: Skin is warm and dry.     Capillary Refill: Capillary refill takes less than 2 seconds.     Coloration: Skin is not pale.     Findings: No erythema or rash.  Neurological:     Mental Status: She is alert and oriented to person, place, and time.     Cranial Nerves: No cranial nerve deficit.     Sensory: No sensory deficit.     Coordination: Coordination normal.     Deep Tendon Reflexes: Reflexes normal.            Assessment & Plan:  Anemia, unspecified type - Plan: Iron, Vitamin B12  Type 2 diabetes mellitus with hyperlipidemia (HCC)  Essential hypertension, benign Patient's hemoglobin A1c is excellent at 6.5.  Her cholesterol is very good.  Her blood pressure is slightly elevated today but I think some of this could be due to anxiety.  Of asked the patient to check her blood pressure at home for 1 week at random times and notify me of the values.  If consistently greater than 140/90 additional treatment may be needed.  My biggest concern is the continued drop in her hemoglobin.  She has been on B12 100.  I will check iron levels along with B12 levels today.  If iron levels are low, she may need to see hematology about IV iron due to malabsorption.  Patient has a colonoscopy pending for later this week

## 2023-12-29 LAB — COMPLETE METABOLIC PANEL WITHOUT GFR
AG Ratio: 1.2 (calc) (ref 1.0–2.5)
ALT: 12 U/L (ref 6–29)
AST: 17 U/L (ref 10–35)
Albumin: 4.1 g/dL (ref 3.6–5.1)
Alkaline phosphatase (APISO): 71 U/L (ref 37–153)
BUN: 15 mg/dL (ref 7–25)
CO2: 27 mmol/L (ref 20–32)
Calcium: 9.7 mg/dL (ref 8.6–10.4)
Chloride: 101 mmol/L (ref 98–110)
Creat: 1.04 mg/dL (ref 0.50–1.05)
Globulin: 3.5 g/dL (ref 1.9–3.7)
Glucose, Bld: 86 mg/dL (ref 65–99)
Potassium: 4.9 mmol/L (ref 3.5–5.3)
Sodium: 138 mmol/L (ref 135–146)
Total Bilirubin: 0.5 mg/dL (ref 0.2–1.2)
Total Protein: 7.6 g/dL (ref 6.1–8.1)

## 2023-12-29 LAB — LIPID PANEL
Cholesterol: 160 mg/dL (ref <200)
HDL: 48 mg/dL — ABNORMAL LOW (ref >=50)
LDL Cholesterol (Calc): 96 mg/dL
Non-HDL Cholesterol (Calc): 112 mg/dL (ref <130)
Total CHOL/HDL Ratio: 3.3 (calc) (ref <5.0)
Triglycerides: 71 mg/dL (ref <150)

## 2023-12-29 LAB — VITAMIN B12: Vitamin B-12: 843 pg/mL (ref 200–1100)

## 2023-12-29 LAB — CBC WITH DIFFERENTIAL/PLATELET
Absolute Lymphocytes: 1482 {cells}/uL (ref 850–3900)
Absolute Monocytes: 728 {cells}/uL (ref 200–950)
Basophils Absolute: 48 {cells}/uL (ref 0–200)
Basophils Relative: 0.7 %
Eosinophils Absolute: 258 {cells}/uL (ref 15–500)
Eosinophils Relative: 3.8 %
HCT: 34.8 % — ABNORMAL LOW (ref 35.0–45.0)
Hemoglobin: 10.6 g/dL — ABNORMAL LOW (ref 11.7–15.5)
MCH: 26.4 pg — ABNORMAL LOW (ref 27.0–33.0)
MCHC: 30.5 g/dL — ABNORMAL LOW (ref 32.0–36.0)
MCV: 86.6 fL (ref 80.0–100.0)
MPV: 10.8 fL (ref 7.5–12.5)
Monocytes Relative: 10.7 %
Neutro Abs: 4284 {cells}/uL (ref 1500–7800)
Neutrophils Relative %: 63 %
Platelets: 329 Thousand/uL (ref 140–400)
RBC: 4.02 Million/uL (ref 3.80–5.10)
RDW: 13.7 % (ref 11.0–15.0)
Total Lymphocyte: 21.8 %
WBC: 6.8 Thousand/uL (ref 3.8–10.8)

## 2023-12-29 LAB — MICROALBUMIN / CREATININE URINE RATIO
Creatinine, Urine: 81 mg/dL (ref 20–275)
Microalb Creat Ratio: 14 mg/g{creat} (ref <30)
Microalb, Ur: 1.1 mg/dL

## 2023-12-29 LAB — HEMOGLOBIN A1C
Hgb A1c MFr Bld: 6.5 % — ABNORMAL HIGH (ref <5.7)
Mean Plasma Glucose: 140 mg/dL
eAG (mmol/L): 7.7 mmol/L

## 2023-12-29 LAB — TEST AUTHORIZATION

## 2023-12-29 LAB — IRON: Iron: 47 ug/dL (ref 45–160)

## 2023-12-31 ENCOUNTER — Encounter: Payer: Self-pay | Admitting: Gastroenterology

## 2023-12-31 ENCOUNTER — Encounter: Admitting: Gastroenterology

## 2023-12-31 ENCOUNTER — Ambulatory Visit (AMBULATORY_SURGERY_CENTER): Admitting: Gastroenterology

## 2023-12-31 VITALS — BP 123/66 | HR 71 | Temp 97.9°F | Resp 16 | Ht 65.0 in | Wt 142.0 lb

## 2023-12-31 DIAGNOSIS — D122 Benign neoplasm of ascending colon: Secondary | ICD-10-CM | POA: Diagnosis not present

## 2023-12-31 DIAGNOSIS — D123 Benign neoplasm of transverse colon: Secondary | ICD-10-CM

## 2023-12-31 DIAGNOSIS — K6389 Other specified diseases of intestine: Secondary | ICD-10-CM

## 2023-12-31 DIAGNOSIS — Z1211 Encounter for screening for malignant neoplasm of colon: Secondary | ICD-10-CM

## 2023-12-31 DIAGNOSIS — Z860100 Personal history of colon polyps, unspecified: Secondary | ICD-10-CM

## 2023-12-31 DIAGNOSIS — K5730 Diverticulosis of large intestine without perforation or abscess without bleeding: Secondary | ICD-10-CM | POA: Diagnosis not present

## 2023-12-31 MED ORDER — SODIUM CHLORIDE 0.9 % IV SOLN: 500.0000 mL | Freq: Once | INTRAVENOUS | Status: DC

## 2023-12-31 NOTE — Patient Instructions (Signed)
 Resume previous diet Continue present medications Await pathology results Repeat colonoscopy date determined based on pathology results YOU HAD AN ENDOSCOPIC PROCEDURE TODAY AT THE [facility]:   Refer to the procedure report that was given to you for any specific questions about what was found during the examination.  If the procedure report does not answer your questions, please call your gastroenterologist to clarify.  If you requested that your care partner not be given the details of your procedure findings, then the procedure report has been included in a sealed envelope for you to review at your convenience later.  YOU SHOULD EXPECT: Some feelings of bloating in the abdomen. Passage of more gas than usual.  Walking can help get rid of the air that was put into your GI tract during the procedure and reduce the bloating. If you had a lower endoscopy (such as a colonoscopy or flexible sigmoidoscopy) you may notice spotting of blood in your stool or on the toilet paper. If you underwent a bowel prep for your procedure, you may not have a normal bowel movement for a few days.  Please Note:  You might notice some irritation and congestion in your nose or some drainage.  This is from the oxygen used during your procedure.  There is no need for concern and it should clear up in a day or so.  SYMPTOMS TO REPORT IMMEDIATELY:  Following lower endoscopy (colonoscopy or flexible sigmoidoscopy):  Excessive amounts of blood in the stool  Significant tenderness or worsening of abdominal pains  Swelling of the abdomen that is new, acute  Fever of 100F or higher  For urgent or emergent issues, a gastroenterologist can be reached at any hour by calling [phone]. Do not use MyChart messaging for urgent concerns.   DIET:  We do recommend a small meal at first, but then you may proceed to your regular diet.  Drink plenty of fluids but you should avoid alcoholic beverages for 24  hours.  ACTIVITY:  You should plan to take it easy for the rest of today and you should NOT DRIVE or use heavy machinery until tomorrow (because of the sedation medicines used during the test).    FOLLOW UP: Our staff will call the number listed on your records the next business day following your procedure.  We will call around 7:15- 8:00 am to check on you and address any questions or concerns that you may have regarding the information given to you following your procedure. If we do not reach you, we will leave a message.     If any biopsies were taken you will be contacted by phone or by letter within the next 1-3 weeks.  Please call us  at [phone] if you have not heard about the biopsies in 3 weeks.   SIGNATURES/CONFIDENTIALITY: You and/or your care partner have signed paperwork which will be entered into your electronic medical record.  These signatures attest to the fact that that the information above on your After Visit Summary has been reviewed and is understood.  Full responsibility of the confidentiality of this discharge information lies with you and/or your care-partner.

## 2023-12-31 NOTE — Progress Notes (Signed)
 Sedate, gd SR, tolerated procedure well, VSS, report to RN

## 2023-12-31 NOTE — Progress Notes (Signed)
 Vitals-DT  Pt's states no medical or surgical changes since previsit or office visit.

## 2023-12-31 NOTE — Progress Notes (Signed)
 History and Physical:  This patient presents for endoscopic testing for: Encounter Diagnosis  Name Primary?   Hx of colonic polyps Yes    Surveillance exam today for Hx diminutive SSP in June 2017 Also noted to have normocytic anemia (to variable degrees) since at least 2019 with normal iron and B12 levels recently  Patient is otherwise without complaints or active issues today.   Past Medical History: Past Medical History:  Diagnosis Date   Abdominal pain    Anemia    B12 deficiency    Biliary colic    Constipation    occasionally not chronic per pt. -no medicines for this    Diabetes mellitus 12 yrs ago    Hyperlipidemia    Hypertension    Pernicious anemia    Unexplained weight loss    Vomiting    Weakness      Past Surgical History: Past Surgical History:  Procedure Laterality Date   CESAREAN SECTION  11/29/1990, 04/06/1994   COLONOSCOPY  09/01/2004   all normal - in epic   KNEE SURGERY  11/2000   right   MOUTH SURGERY  04/04/1987   UPPER GASTROINTESTINAL ENDOSCOPY  09-01-04   gastric polyp- m.johnson- in epic   WISDOM TOOTH EXTRACTION      Allergies: Allergies  Allergen Reactions   Penicillins Hives    Back of neck and upper back.    Outpatient Meds: Current Outpatient Medications  Medication Sig Dispense Refill   aspirin  81 MG tablet Take 81 mg by mouth daily.     atorvastatin  (LIPITOR) 40 MG tablet Take 1 tablet (40 mg total) by mouth daily. 90 tablet 3   Calcium  Carbonate-Vit D-Min (CALCIUM  1200 PO) Take by mouth.     cholecalciferol (VITAMIN D3) 25 MCG (1000 UNIT) tablet Take 1,000 Units by mouth daily.     empagliflozin  (JARDIANCE ) 25 MG TABS tablet 1/2 TABLET DAILY 135 tablet 1   ferrous sulfate 325 (65 FE) MG EC tablet Take 325 mg by mouth daily. Take 1 tablet by mouth daily.     glucose blood (ONETOUCH ULTRA) test strip Use as directed to check blood sugars twice per day. 100 strip 12   losartan  (COZAAR ) 100 MG tablet Take 1 tablet (100 mg  total) by mouth daily. 90 tablet 3   metFORMIN  (GLUCOPHAGE ) 1000 MG tablet Take 1 tablet (1,000 mg total) by mouth 2 (two) times daily with a meal. 180 tablet 0   OneTouch Delica Lancets 33G MISC Use to check blood sugars two times per day. E11.9 100 each 12   vitamin B-12 (CYANOCOBALAMIN ) 500 MCG tablet Take 1,000 mcg by mouth daily. Take 1 tablet by mouth daily.     Dulaglutide  (TRULICITY ) 1.5 MG/0.5ML SOPN Inject 1.5 mg into the skin once a week. 6 mL 3   Current Facility-Administered Medications  Medication Dose Route Frequency Provider Last Rate Last Admin   0.9 %  sodium chloride  infusion  500 mL Intravenous Once Danis, Victory CROME III, MD          ___________________________________________________________________ Objective   Exam:  BP (!) 168/80 (BP Location: Right Arm, Cuff Size: Normal)   Pulse 90   Temp 97.9 F (36.6 C) (Temporal)   Ht 5' 5 (1.651 m)   Wt 142 lb (64.4 kg)   SpO2 100%   BMI 23.63 kg/m   CV: regular , S1/S2 Resp: clear to auscultation bilaterally, normal RR and effort noted GI: soft, no tenderness, with active bowel sounds.   Assessment: Encounter  Diagnosis  Name Primary?   Hx of colonic polyps Yes     Plan: Colonoscopy   The benefits and risks of the planned procedure(s) were described in detail with the patient or (when appropriate) their health care proxy.  Risks were outlined as including, but not limited to, bleeding, infection, perforation, adverse medication reaction leading to cardiac or pulmonary decompensation, pancreatitis (if ERCP).  The limitation of incomplete mucosal visualization was also discussed.  No guarantees or warranties were given.  The patient is appropriate for an endoscopic procedure in the ambulatory setting.   - Victory Brand, MD

## 2023-12-31 NOTE — Op Note (Signed)
 [facility] Patient Name: Sherry Hart Procedure Date: 12/31/2023 1:53 PM MRN: [id number] Endoscopist: Victory L. Legrand , MD, [id number] Age: 63 Referring MD:  Date of Birth: Oct 20, 1960 Gender: Female Account #: [id number] Procedure:                Colonoscopy Indications:              Personal history of sessile serrated colon polyp                            (less than 10 mm in size) with no dysplasia                           [facility] June 2017 Medicines:                Monitored Anesthesia Care Procedure:                Pre-Anesthesia Assessment:                           - Prior to the procedure, a History and Physical                            was performed, and patient medications and                            allergies were reviewed. The patient's tolerance of                            previous anesthesia was also reviewed. The risks                            and benefits of the procedure and the sedation                            options and risks were discussed with the patient.                            All questions were answered, and informed consent                            was obtained. Prior Anticoagulants: The patient has                            taken no anticoagulant or antiplatelet agents. ASA                            Grade Assessment: II - A patient with mild systemic                            disease. After reviewing the risks and benefits,                            the patient was deemed in satisfactory condition to  undergo the procedure.                           After obtaining informed consent, the colonoscope                            was passed under direct vision. Throughout the                            procedure, the patient's blood pressure, pulse, and                            oxygen saturations were monitored continuously. The                            Olympus CF-HQ190L ([id number]) Colonoscope was                             introduced through the anus and advanced to the the                            cecum, identified by appendiceal orifice and                            ileocecal valve. The colonoscopy was performed                            without difficulty. The patient tolerated the                            procedure well. The quality of the bowel                            preparation was good. The ileocecal valve,                            appendiceal orifice, and rectum were photographed. Scope In: 2:00:32 PM Scope Out: 2:18:33 PM Scope Withdrawal Time: 0 hours 12 minutes 48 seconds  Total Procedure Duration: 0 hours 18 minutes 1 second  Findings:                 The perianal and digital rectal examinations were                            normal.                           Two semi-sessile polyps were found in the                            transverse colon and ascending colon. The polyps                            were 5 mm in size. These polyps were removed with a  cold snare. Resection and retrieval were complete.                           Repeat examination of right colon under NBI                            performed.                           Multiple diverticula were found in the left colon.                           The exam was otherwise without abnormality on                            direct and retroflexion views. Complications:            No immediate complications. Estimated Blood Loss:     Estimated blood loss was minimal. Impression:               - Two 5 mm polyps in the transverse colon and in                            the ascending colon, removed with a cold snare.                            Resected and retrieved.                           - Diverticulosis in the left colon.                           - The examination was otherwise normal on direct                            and retroflexion views. Recommendation:            - Patient has a contact number available for                            emergencies. The signs and symptoms of potential                            delayed complications were discussed with the                            patient. Return to normal activities tomorrow.                            Written discharge instructions were provided to the                            patient.                           - Resume previous diet.                           -  Continue present medications.                           - Await pathology results.                           - Repeat colonoscopy is recommended for                            surveillance. The colonoscopy date will be                            determined after pathology results from today's                            exam become available for review.                           Incidental note also made of intermittent                            normocytic anemia for at least 5 years with normal                            iron, B12 recently. Prior Iron B12 and folate nml                            This does not appear likely to be related to GI                            blood loss. Arly Salminen L. Legrand, MD 12/31/2023 2:23:58 PM This report has been signed electronically.

## 2024-01-03 ENCOUNTER — Other Ambulatory Visit

## 2024-01-03 ENCOUNTER — Telehealth: Payer: Self-pay

## 2024-01-03 NOTE — Telephone Encounter (Signed)
  Follow up Call-     12/31/2023   12:58 PM 12/31/2023   12:42 PM  Call back number  Post procedure Call Back phone  # [phone]   Permission to leave phone message  Yes     Patient questions:  Do you have a fever, pain , or abdominal swelling? No. Pain Score  0 *  Have you tolerated food without any problems? Yes.    Have you been able to return to your normal activities? Yes.    Do you have any questions about your discharge instructions: Diet   No. Medications  No. Follow up visit  No.  Do you have questions or concerns about your Care? No.  Actions: * If pain score is 4 or above: No action needed, pain <4.

## 2024-01-05 LAB — SURGICAL PATHOLOGY

## 2024-01-06 ENCOUNTER — Ambulatory Visit: Payer: Self-pay | Admitting: Gastroenterology

## 2024-01-14 ENCOUNTER — Other Ambulatory Visit: Payer: Self-pay | Admitting: Family Medicine

## 2024-01-14 NOTE — Telephone Encounter (Signed)
 Requested Prescriptions  Pending Prescriptions Disp Refills   metFORMIN  (GLUCOPHAGE ) 1000 MG tablet [Pharmacy Med Name: METFORMIN  HCL 1,000 MG TABLET] 180 tablet 1    Sig: TAKE 1 TABLET (1,000 MG TOTAL) BY MOUTH TWICE A DAY WITH FOOD     Endocrinology:  Diabetes - Biguanides Passed - 01/14/2024  2:15 PM      Passed - Cr in normal range and within 360 days    Creat  Date Value Ref Range Status  12/24/2023 1.04 0.50 - 1.05 mg/dL Final   Creatinine, Urine  Date Value Ref Range Status  12/24/2023 81 20 - 275 mg/dL Final         Passed - HBA1C is between 0 and 7.9 and within 180 days    Hgb A1c MFr Bld  Date Value Ref Range Status  12/24/2023 6.5 (H) <5.7 % Final    Comment:    For someone without known diabetes, a hemoglobin A1c value of 6.5% or greater indicates that they may have  diabetes and this should be confirmed with a follow-up  test. . For someone with known diabetes, a value <7% indicates  that their diabetes is well controlled and a value  greater than or equal to 7% indicates suboptimal  control. A1c targets should be individualized based on  duration of diabetes, age, comorbid conditions, and  other considerations. . Currently, no consensus exists regarding use of hemoglobin A1c for diagnosis of diabetes for children. .          Passed - eGFR in normal range and within 360 days    GFR, Est African American  Date Value Ref Range Status  12/03/2020 63 > OR = 60 mL/min/1.32m2 Final   GFR, Est Non African American  Date Value Ref Range Status  12/03/2020 55 (L) > OR = 60 mL/min/1.21m2 Final   GFR  Date Value Ref Range Status  04/17/2022 51.16 (L) >60.00 mL/min Final    Comment:    Calculated using the CKD-EPI Creatinine Equation (2021)   eGFR  Date Value Ref Range Status  06/23/2023 64 > OR = 60 mL/min/1.49m2 Final         Passed - B12 Level in normal range and within 720 days    Vitamin B-12  Date Value Ref Range Status  12/24/2023 843 200 - 1,100  pg/mL Final         Passed - Valid encounter within last 6 months    Recent Outpatient Visits           2 weeks ago Anemia, unspecified type   [place] [facility] Duanne Butler DASEN, MD   6 months ago Type 2 diabetes mellitus with hyperlipidemia [facility])   [place] [facility] Duanne Butler DASEN, MD   1 year ago Type 2 diabetes mellitus with hyperlipidemia [facility])   [facility] Duanne Butler DASEN, MD   1 year ago Type 2 diabetes mellitus with hyperlipidemia [facility])   [facility] Duanne Butler DASEN, MD   1 year ago Type 2 diabetes mellitus with hyperlipidemia [facility])    [facility] Pickard, Butler DASEN, MD              Passed - CBC within normal limits and completed in the last 12 months    WBC  Date Value Ref Range Status  12/24/2023 6.8 3.8 - 10.8 Thousand/uL Final   RBC  Date Value Ref Range  Status  12/24/2023 4.02 3.80 - 5.10 Million/uL Final   Hemoglobin  Date Value Ref Range Status  12/24/2023 10.6 (L) 11.7 - 15.5 g/dL Final   HCT  Date Value Ref Range Status  12/24/2023 34.8 (L) 35.0 - 45.0 % Final   MCHC  Date Value Ref Range Status  12/24/2023 30.5 (L) 32.0 - 36.0 g/dL Final    Comment:    For adults, a slight decrease in the calculated MCHC value (in the range of 30 to 32 g/dL) is most likely not clinically significant; however, it should be interpreted with caution in correlation with other red cell parameters and the patient's clinical condition.    [facility]  Date Value Ref Range Status  12/24/2023 26.4 (L) 27.0 - 33.0 pg Final   MCV  Date Value Ref Range Status  12/24/2023 86.6 80.0 - 100.0 fL Final   No results found for: PLTCOUNTKUC, LABPLAT, POCPLA RDW  Date Value Ref Range Status  12/24/2023 13.7 11.0 - 15.0 % Final

## 2024-02-01 ENCOUNTER — Other Ambulatory Visit: Payer: Self-pay | Admitting: Family Medicine

## 2024-02-02 NOTE — Telephone Encounter (Signed)
 OV 12/27/23 Requested Prescriptions  Pending Prescriptions Disp Refills   atorvastatin  (LIPITOR) 40 MG tablet [Pharmacy Med Name: ATORVASTATIN  40 MG TABLET] 90 tablet 3    Sig: TAKE 1 TABLET BY MOUTH EVERY DAY     Cardiovascular:  Antilipid - Statins Failed - 02/02/2024  2:35 PM      Failed - Lipid Panel in normal range within the last 12 months    Cholesterol  Date Value Ref Range Status  12/24/2023 160 <200 mg/dL Final   LDL Cholesterol (Calc)  Date Value Ref Range Status  12/24/2023 96 mg/dL (calc) Final    Comment:    Reference range: <100 . Desirable range <100 mg/dL for primary prevention;   <70 mg/dL for patients with CHD or diabetic patients  with > or = 2 CHD risk factors. SABRA LDL-C is now calculated using the Martin-Hopkins  calculation, which is a validated novel method providing  better accuracy than the Friedewald equation in the  estimation of LDL-C.  Sherry Hart et al. SANDREA. 7986;689(80): 2061-2068  (http://education.[facility].com/faq/FAQ164)    Direct LDL  Date Value Ref Range Status  07/28/2013 175.6 mg/dL Final    Comment:    Optimal:  <100 mg/dLNear or Above Optimal:  100-129 mg/dLBorderline High:  130-159 mg/dLHigh:  160-189 mg/dLVery High:  >190 mg/dL   HDL  Date Value Ref Range Status  12/24/2023 48 (L) > OR = 50 mg/dL Final   Triglycerides  Date Value Ref Range Status  12/24/2023 71 <150 mg/dL Final         Passed - Patient is not pregnant      Passed - Valid encounter within last 12 months    Recent Outpatient Visits           1 month ago Anemia, unspecified type   [place] [facility] Duanne Butler DASEN, MD   7 months ago Type 2 diabetes mellitus with hyperlipidemia [facility])   [place] [facility] Medicine Duanne Butler DASEN, MD   1 year ago Type 2 diabetes mellitus with hyperlipidemia [facility])   [facility] Duanne Butler DASEN, MD   1 year ago Type 2 diabetes mellitus with  hyperlipidemia [facility])   [place] [facility] Medicine Duanne Butler DASEN, MD   1 year ago Type 2 diabetes mellitus with hyperlipidemia [facility])   [place] Medicine Pickard, Butler DASEN, MD

## 2024-02-15 ENCOUNTER — Telehealth: Payer: Self-pay | Admitting: Family Medicine

## 2024-02-15 ENCOUNTER — Other Ambulatory Visit: Payer: Self-pay

## 2024-02-15 MED ORDER — TRULICITY 1.5 MG/0.5ML ~~LOC~~ SOAJ: 1.5000 mg | SUBCUTANEOUS | 3 refills | Status: AC

## 2024-02-15 NOTE — Telephone Encounter (Signed)
 Sent in medication

## 2024-02-15 NOTE — Telephone Encounter (Signed)
 Prescription Request  02/15/2024  LOV: 12/27/2023  What is the name of the medication or equipment? Dulaglutide  (TRULICITY ) 1.5 MG/0.5ML SOPN   Have you contacted your pharmacy to request a refill? Yes   Which pharmacy would you like this sent to?  [id number] [place], [place] - [address] [place] [address] Phone: [phone] Fax: [phone]    Patient notified that their request is being sent to the clinical staff for review and that they should receive a response within 2 business days.   Please advise at [facility] [phone]

## 2024-03-07 ENCOUNTER — Other Ambulatory Visit: Payer: Self-pay | Admitting: Family Medicine

## 2024-03-07 DIAGNOSIS — E1169 Type 2 diabetes mellitus with other specified complication: Secondary | ICD-10-CM

## 2024-03-07 NOTE — Telephone Encounter (Signed)
 Requested medications are due for refill today.  yes  Requested medications are on the active medications list.  yes  Last refill. 01/26/2023 #135 1 rf a 540 day supply  Future visit scheduled.   yes  Notes to clinic.  Refill protocol will not allow 1/2 to be dosage. Please review.    Requested Prescriptions  Pending Prescriptions Disp Refills   JARDIANCE  25 MG TABS tablet [Pharmacy Med Name: JARDIANCE  25 MG TABLET] 45 tablet 5    Sig: TAKE 1/2 TABLET BY MOUTH DAILY     Endocrinology:  Diabetes - SGLT2 Inhibitors Passed - 03/07/2024  4:52 PM      Passed - Cr in normal range and within 360 days    Creat  Date Value Ref Range Status  12/24/2023 1.04 0.50 - 1.05 mg/dL Final   Creatinine, Urine  Date Value Ref Range Status  12/24/2023 81 20 - 275 mg/dL Final         Passed - HBA1C is between 0 and 7.9 and within 180 days    Hgb A1c MFr Bld  Date Value Ref Range Status  12/24/2023 6.5 (H) <5.7 % Final    Comment:    For someone without known diabetes, a hemoglobin A1c value of 6.5% or greater indicates that they may have  diabetes and this should be confirmed with a follow-up  test. . For someone with known diabetes, a value <7% indicates  that their diabetes is well controlled and a value  greater than or equal to 7% indicates suboptimal  control. A1c targets should be individualized based on  duration of diabetes, age, comorbid conditions, and  other considerations. . Currently, no consensus exists regarding use of hemoglobin A1c for diagnosis of diabetes for children. .          Passed - eGFR in normal range and within 360 days    GFR, Est African American  Date Value Ref Range Status  12/03/2020 63 > OR = 60 mL/min/1.67m2 Final   GFR, Est Non African American  Date Value Ref Range Status  12/03/2020 55 (L) > OR = 60 mL/min/1.87m2 Final   GFR  Date Value Ref Range Status  04/17/2022 51.16 (L) >60.00 mL/min Final    Comment:    Calculated using the CKD-EPI  Creatinine Equation (2021)   eGFR  Date Value Ref Range Status  06/23/2023 64 > OR = 60 mL/min/1.35m2 Final         Passed - Valid encounter within last 6 months    Recent Outpatient Visits           2 months ago Anemia, unspecified type   [place] [facility] Medicine Duanne Butler DASEN, MD   8 months ago Type 2 diabetes mellitus with hyperlipidemia [facility])   [place] [facility] Medicine Duanne Butler DASEN, MD   1 year ago Type 2 diabetes mellitus with hyperlipidemia [facility])   [place] [facility] Medicine Duanne Butler DASEN, MD   1 year ago Type 2 diabetes mellitus with hyperlipidemia [facility])   [facility] Family Medicine Duanne Butler DASEN, MD   1 year ago Type 2 diabetes mellitus with hyperlipidemia [facility])    [facility] Pickard, Butler DASEN, MD

## 2024-05-01 ENCOUNTER — Other Ambulatory Visit: Payer: Self-pay | Admitting: Family Medicine

## 2024-05-01 DIAGNOSIS — Z1231 Encounter for screening mammogram for malignant neoplasm of breast: Secondary | ICD-10-CM

## 2024-06-05 ENCOUNTER — Inpatient Hospital Stay: Admission: RE | Admit: 2024-06-05 | Discharge: 2024-06-05 | Attending: Family Medicine | Admitting: Family Medicine

## 2024-06-05 DIAGNOSIS — Z1231 Encounter for screening mammogram for malignant neoplasm of breast: Secondary | ICD-10-CM

## 2024-06-28 ENCOUNTER — Other Ambulatory Visit

## 2024-06-28 DIAGNOSIS — I10 Essential (primary) hypertension: Secondary | ICD-10-CM

## 2024-06-28 DIAGNOSIS — E538 Deficiency of other specified B group vitamins: Secondary | ICD-10-CM

## 2024-06-28 DIAGNOSIS — E1169 Type 2 diabetes mellitus with other specified complication: Secondary | ICD-10-CM

## 2024-06-28 DIAGNOSIS — D539 Nutritional anemia, unspecified: Secondary | ICD-10-CM

## 2024-06-28 DIAGNOSIS — D649 Anemia, unspecified: Secondary | ICD-10-CM

## 2024-06-29 ENCOUNTER — Ambulatory Visit: Payer: Self-pay | Admitting: Family Medicine

## 2024-06-29 LAB — CBC WITH DIFFERENTIAL/PLATELET
Absolute Lymphocytes: 1382 {cells}/uL (ref 850–3900)
Absolute Monocytes: 508 {cells}/uL (ref 200–950)
Basophils Absolute: 59 {cells}/uL (ref 0–200)
Basophils Relative: 1.1 %
Eosinophils Absolute: 92 {cells}/uL (ref 15–500)
Eosinophils Relative: 1.7 %
HCT: 35.1 % — ABNORMAL LOW (ref 35.9–46.0)
Hemoglobin: 10.9 g/dL — ABNORMAL LOW (ref 11.7–15.5)
MCH: 26.5 pg — ABNORMAL LOW (ref 27.0–33.0)
MCHC: 31.1 g/dL — ABNORMAL LOW (ref 31.6–35.4)
MCV: 85.4 fL (ref 81.4–101.7)
MPV: 10.9 fL (ref 7.5–12.5)
Monocytes Relative: 9.4 %
Neutro Abs: 3359 {cells}/uL (ref 1500–7800)
Neutrophils Relative %: 62.2 %
Platelets: 300 Thousand/uL (ref 140–400)
RBC: 4.11 Million/uL (ref 3.80–5.10)
RDW: 14.5 % (ref 11.0–15.0)
Total Lymphocyte: 25.6 %
WBC: 5.4 Thousand/uL (ref 3.8–10.8)

## 2024-06-29 LAB — COMPLETE METABOLIC PANEL WITHOUT GFR
AG Ratio: 1.2 (calc) (ref 1.0–2.5)
ALT: 21 U/L (ref 6–29)
AST: 26 U/L (ref 10–35)
Albumin: 4.3 g/dL (ref 3.6–5.1)
Alkaline phosphatase (APISO): 67 U/L (ref 37–153)
BUN/Creatinine Ratio: 16 (calc) (ref 6–22)
BUN: 19 mg/dL (ref 7–25)
CO2: 31 mmol/L (ref 20–32)
Calcium: 10.1 mg/dL (ref 8.6–10.4)
Chloride: 102 mmol/L (ref 98–110)
Creat: 1.16 mg/dL — ABNORMAL HIGH (ref 0.50–1.05)
Globulin: 3.5 g/dL (ref 1.9–3.7)
Glucose, Bld: 82 mg/dL (ref 65–99)
Potassium: 4.7 mmol/L (ref 3.5–5.3)
Sodium: 140 mmol/L (ref 135–146)
Total Bilirubin: 0.8 mg/dL (ref 0.2–1.2)
Total Protein: 7.8 g/dL (ref 6.1–8.1)

## 2024-06-29 LAB — LIPID PANEL
Cholesterol: 151 mg/dL
HDL: 50 mg/dL
LDL Cholesterol (Calc): 86 mg/dL
Non-HDL Cholesterol (Calc): 101 mg/dL
Total CHOL/HDL Ratio: 3 (calc)
Triglycerides: 61 mg/dL

## 2024-06-29 LAB — HEMOGLOBIN A1C
Hgb A1c MFr Bld: 6.3 % — ABNORMAL HIGH
Mean Plasma Glucose: 134 mg/dL
eAG (mmol/L): 7.4 mmol/L

## 2024-07-03 ENCOUNTER — Ambulatory Visit: Admitting: Family Medicine

## 2024-07-03 ENCOUNTER — Encounter: Payer: Self-pay | Admitting: Family Medicine

## 2024-07-03 VITALS — BP 144/72 | HR 87 | Temp 97.8°F | Ht 65.0 in | Wt 142.4 lb

## 2024-07-03 DIAGNOSIS — Z780 Asymptomatic menopausal state: Secondary | ICD-10-CM | POA: Diagnosis not present

## 2024-07-03 DIAGNOSIS — Z0000 Encounter for general adult medical examination without abnormal findings: Secondary | ICD-10-CM

## 2024-07-03 DIAGNOSIS — D649 Anemia, unspecified: Secondary | ICD-10-CM

## 2024-07-03 DIAGNOSIS — E785 Hyperlipidemia, unspecified: Secondary | ICD-10-CM | POA: Diagnosis not present

## 2024-07-03 DIAGNOSIS — Z0001 Encounter for general adult medical examination with abnormal findings: Secondary | ICD-10-CM | POA: Diagnosis not present

## 2024-07-03 DIAGNOSIS — Z7984 Long term (current) use of oral hypoglycemic drugs: Secondary | ICD-10-CM | POA: Diagnosis not present

## 2024-07-03 DIAGNOSIS — I10 Essential (primary) hypertension: Secondary | ICD-10-CM

## 2024-07-03 DIAGNOSIS — E1169 Type 2 diabetes mellitus with other specified complication: Secondary | ICD-10-CM | POA: Diagnosis not present

## 2024-07-03 NOTE — Progress Notes (Signed)
 "  Subjective:    Patient ID: Sherry Hart, female    DOB: Jul 14, 1960, 64 y.o.   MRN: [id number]  HPI Patient is a 64 year old African-American female who is here today for complete physical exam.  Her last colonoscopy was 12/2023.  She had mammogram 12/25 that was normal.  She is due for a Pap smear but she defers this until the summertime.  She is due for a booster on her pneumonia shot.  We discussed this today and she would like to go to the pharmacy to receive CAPVAXIVE.  She is also due for a tetanus shot however we are out of stock with these.  She declines the flu shot.  She declines the COVID shot.  She declines RSV. Lab on 06/28/2024  Component Date Value Ref Range Status   WBC 06/28/2024 5.4  3.8 - 10.8 Thousand/uL Final   RBC 06/28/2024 4.11  3.80 - 5.10 Million/uL Final   Hemoglobin 06/28/2024 10.9 (L)  11.7 - 15.5 g/dL Final   HCT 98/85/7973 35.1 (L)  35.9 - 46.0 % Final   MCV 06/28/2024 85.4  81.4 - 101.7 fL Final   MCH 06/28/2024 26.5 (L)  27.0 - 33.0 pg Final   MCHC 06/28/2024 31.1 (L)  31.6 - 35.4 g/dL Final   RDW 98/85/7973 14.5  11.0 - 15.0 % Final   Platelets 06/28/2024 300  140 - 400 Thousand/uL Final   MPV 06/28/2024 10.9  7.5 - 12.5 fL Final   Neutro Abs 06/28/2024 3,359  1,500 - 7,800 cells/uL Final   Absolute Lymphocytes 06/28/2024 1,382  850 - 3,900 cells/uL Final   Absolute Monocytes 06/28/2024 508  200 - 950 cells/uL Final   Eosinophils Absolute 06/28/2024 92  15 - 500 cells/uL Final   Basophils Absolute 06/28/2024 59  0 - 200 cells/uL Final   Neutrophils Relative % 06/28/2024 62.2  % Final   Total Lymphocyte 06/28/2024 25.6  % Final   Monocytes Relative 06/28/2024 9.4  % Final   Eosinophils Relative 06/28/2024 1.7  % Final   Basophils Relative 06/28/2024 1.1  % Final   Glucose, Bld 06/28/2024 82  65 - 99 mg/dL Final   Comment: .            Fasting reference interval .    BUN 06/28/2024 19  7 - 25 mg/dL Final   Creat 98/85/7973 1.16 (H)  0.50 -  1.05 mg/dL Final   BUN/Creatinine Ratio 06/28/2024 16  6 - 22 (calc) Final   Sodium 06/28/2024 140  135 - 146 mmol/L Final   Potassium 06/28/2024 4.7  3.5 - 5.3 mmol/L Final   Chloride 06/28/2024 102  98 - 110 mmol/L Final   CO2 06/28/2024 31  20 - 32 mmol/L Final   Calcium  06/28/2024 10.1  8.6 - 10.4 mg/dL Final   Total Protein 98/85/7973 7.8  6.1 - 8.1 g/dL Final   Albumin 98/85/7973 4.3  3.6 - 5.1 g/dL Final   Globulin 98/85/7973 3.5  1.9 - 3.7 g/dL (calc) Final   AG Ratio 06/28/2024 1.2  1.0 - 2.5 (calc) Final   Total Bilirubin 06/28/2024 0.8  0.2 - 1.2 mg/dL Final   Alkaline phosphatase (APISO) 06/28/2024 67  37 - 153 U/L Final   AST 06/28/2024 26  10 - 35 U/L Final   ALT 06/28/2024 21  6 - 29 U/L Final   Cholesterol 06/28/2024 151  <200 mg/dL Final   HDL 98/85/7973 50  > OR = 50 mg/dL Final   Triglycerides  06/28/2024 61  <150 mg/dL Final   LDL Cholesterol (Calc) 06/28/2024 86  mg/dL (calc) Final   Comment: Reference range: <100 . Desirable range <100 mg/dL for primary prevention;   <70 mg/dL for patients with CHD or diabetic patients  with > or = 2 CHD risk factors. SABRA LDL-C is now calculated using the Martin-Hopkins  calculation, which is a validated novel method providing  better accuracy than the Friedewald equation in the  estimation of LDL-C.  Gladis APPLETHWAITE et al. SANDREA. 7986;689(80): 2061-2068  (http://education.[facility].com/faq/FAQ164)    Total CHOL/HDL Ratio 06/28/2024 3.0  <4.9 (calc) Final   Non-HDL Cholesterol (Calc) 06/28/2024 101  <130 mg/dL (calc) Final   Comment: For patients with diabetes plus 1 major ASCVD risk  factor, treating to a non-HDL-C goal of <100 mg/dL  (LDL-C of <29 mg/dL) is considered a therapeutic  option.    Hgb A1c MFr Bld 06/28/2024 6.3 (H)  <5.7 % Final   Comment: For someone without known diabetes, a hemoglobin  A1c value between 5.7% and 6.4% is consistent with prediabetes and should be confirmed with a  follow-up test. . For  someone with known diabetes, a value <7% indicates that their diabetes is well controlled. A1c targets should be individualized based on duration of diabetes, age, comorbid conditions, and other considerations. . This assay result is consistent with an increased risk of diabetes. . Currently, no consensus exists regarding use of hemoglobin A1c for diagnosis of diabetes for children. .    Mean Plasma Glucose 06/28/2024 134  mg/dL Final   eAG (mmol/L) 98/85/7973 7.4  mmol/L Final     Immunization record is listed below: Immunization History  Administered Date(s) Administered   Influenza,inj,Quad PF,6+ Mos 03/27/2015, 03/06/2016, 03/15/2017, 02/24/2018, 03/14/2019, 03/21/2020, 04/21/2021   Influenza-Unspecified 04/12/2014, 03/21/2020   PFIZER(Purple Top)SARS-COV-2 Vaccination 09/07/2019, 05/20/2020, 05/20/2020   Pneumococcal Polysaccharide-23 01/24/2018   Tdap 08/13/2014   Zoster Recombinant(Shingrix) 01/30/2020, 08/01/2020   Past Medical History:  Diagnosis Date   Abdominal pain    Anemia    B12 deficiency    Biliary colic    Constipation    occasionally not chronic per pt. -no medicines for this    Diabetes mellitus 12 yrs ago    Hyperlipidemia    Hypertension    Pernicious anemia    Unexplained weight loss    Vomiting    Weakness    Past Surgical History:  Procedure Laterality Date   CESAREAN SECTION  11/29/1990, 04/06/1994   COLONOSCOPY  09/01/2004   all normal - in epic   KNEE SURGERY  11/2000   right   MOUTH SURGERY  04/04/1987   UPPER GASTROINTESTINAL ENDOSCOPY  09-01-04   gastric polyp- m.johnson- in epic   WISDOM TOOTH EXTRACTION     Current Outpatient Medications on File Prior to Visit  Medication Sig Dispense Refill   aspirin  81 MG tablet Take 81 mg by mouth daily.     atorvastatin  (LIPITOR) 40 MG tablet TAKE 1 TABLET BY MOUTH EVERY DAY 90 tablet 3   Calcium  Carbonate-Vit D-Min (CALCIUM  1200 PO) Take by mouth.     cholecalciferol (VITAMIN D3) 25 MCG  (1000 UNIT) tablet Take 1,000 Units by mouth daily.     Dulaglutide  (TRULICITY ) 1.5 MG/0.5ML SOAJ Inject 1.5 mg into the skin once a week. 6 mL 3   empagliflozin  (JARDIANCE ) 25 MG TABS tablet Take 1/2 tablet by mouth daily 45 tablet 5   ferrous sulfate 325 (65 FE) MG EC tablet Take 325 mg by mouth  daily. Take 1 tablet by mouth daily.     glucose blood (ONETOUCH ULTRA) test strip Use as directed to check blood sugars twice per day. 100 strip 12   losartan  (COZAAR ) 100 MG tablet Take 1 tablet (100 mg total) by mouth daily. 90 tablet 3   metFORMIN  (GLUCOPHAGE ) 1000 MG tablet TAKE 1 TABLET (1,000 MG TOTAL) BY MOUTH TWICE A DAY WITH FOOD 180 tablet 1   OneTouch Delica Lancets 33G MISC Use to check blood sugars two times per day. E11.9 100 each 12   vitamin B-12 (CYANOCOBALAMIN ) 500 MCG tablet Take 1,000 mcg by mouth daily. Take 1 tablet by mouth daily.     No current facility-administered medications on file prior to visit.   Allergies  Allergen Reactions   Penicillins Hives    Back of neck and upper back.   Social History   Socioeconomic History   Marital status: Married    Spouse name: Not on file   Number of children: Not on file   Years of education: Not on file   Highest education level: Not on file  Occupational History   Not on file  Tobacco Use   Smoking status: Never   Smokeless tobacco: Never  Vaping Use   Vaping status: Never Used  Substance and Sexual Activity   Alcohol use: No    Alcohol/week: 0.0 standard drinks of alcohol   Drug use: No   Sexual activity: Not Currently  Other Topics Concern   Not on file  Social History Narrative   Not on file   Social Drivers of Health   Tobacco Use: Low Risk (12/31/2023)   Patient History    Smoking Tobacco Use: Never    Smokeless Tobacco Use: Never    Passive Exposure: Not on file  Financial Resource Strain: Not on file  Food Insecurity: Not on file  Transportation Needs: Not on file  Physical Activity: Not on file   Stress: Not on file  Social Connections: Not on file  Intimate Partner Violence: Not on file  Depression (PHQ2-9): Low Risk (12/27/2023)   Depression (PHQ2-9)    PHQ-2 Score: 0  Alcohol Screen: Not on file  Housing: Not on file  Utilities: Not on file  Health Literacy: Not on file   Family History  Problem Relation Age of Onset   Breast cancer Mother 62   Cancer Mother        breast   Diabetes Father    Thyroid  disease Sister    Hypertension Neg Hx    Colon cancer Neg Hx    Colon polyps Neg Hx    Rectal cancer Neg Hx    Stomach cancer Neg Hx       Review of Systems  All other systems reviewed and are negative.      Objective:   Physical Exam Vitals reviewed.  Constitutional:      Appearance: She is well-developed.  HENT:     Head: Normocephalic and atraumatic.     Right Ear: External ear normal.     Left Ear: External ear normal.     Nose: Nose normal.     Mouth/Throat:     Pharynx: No oropharyngeal exudate.  Eyes:     General: No scleral icterus.       Right eye: No discharge.        Left eye: No discharge.     Conjunctiva/sclera: Conjunctivae normal.     Pupils: Pupils are equal, round, and reactive to light.  Neck:     Thyroid : No thyromegaly.     Vascular: No JVD.     Trachea: No tracheal deviation.  Cardiovascular:     Rate and Rhythm: Normal rate and regular rhythm.     Heart sounds: Normal heart sounds. No murmur heard.    No friction rub. No gallop.  Pulmonary:     Effort: Pulmonary effort is normal. No respiratory distress.     Breath sounds: Normal breath sounds. No stridor. No wheezing or rales.  Chest:     Chest wall: No tenderness.  Abdominal:     General: Bowel sounds are normal. There is no distension.     Palpations: Abdomen is soft. There is no mass.     Tenderness: There is no abdominal tenderness. There is no guarding or rebound.     Hernia: No hernia is present.  Genitourinary:    Cervix: No cervical motion tenderness,  discharge, friability or erythema.     Uterus: Normal. Not enlarged.      Adnexa:        Right: No mass or tenderness.         Left: No mass or tenderness.    Musculoskeletal:        General: No tenderness or deformity. Normal range of motion.     Cervical back: Normal range of motion and neck supple.  Lymphadenopathy:     Cervical: No cervical adenopathy.  Skin:    General: Skin is warm and dry.     Capillary Refill: Capillary refill takes less than 2 seconds.     Coloration: Skin is not pale.     Findings: No erythema or rash.  Neurological:     Mental Status: She is alert and oriented to person, place, and time.     Cranial Nerves: No cranial nerve deficit.     Sensory: No sensory deficit.     Motor: No abnormal muscle tone.     Coordination: Coordination normal.     Deep Tendon Reflexes: Reflexes normal.           Assessment & Plan:  General medical exam  Type 2 diabetes mellitus with hyperlipidemia (HCC)  Essential hypertension, benign  Anemia, unspecified type  Postmenopausal estrogen deficiency Patient prefers to get the pneumonia shot at her pharmacy.  She is due for a tetanus shot but we are out of stock.  Recommended the flu shot which she declined.  We discussed RSV and COVID.  Colonoscopy and mammogram are up-to-date.  Patient declines the Pap smear today and defers that till later this summer.  She is due for a bone density which I will schedule for her.  Lab work is outstanding.  Hemoglobin A1c is 6.3.  LDL cholesterol is less than 100.  The remainder of her lab work is normal aside from a mild chronic anemia.  However the patient's chronic anemia traces back more than 10 years.  She states that she has been told she has been anemic since the birth of her children back in the 90s.  Therefore I think this is a chronic lab finding for this patient and does not indicate an underlying serious medical issue. "

## 2024-07-09 ENCOUNTER — Other Ambulatory Visit: Payer: Self-pay | Admitting: Family Medicine

## 2024-07-09 DIAGNOSIS — I10 Essential (primary) hypertension: Secondary | ICD-10-CM

## 2024-07-10 NOTE — Telephone Encounter (Signed)
 Requested Prescriptions  Pending Prescriptions Disp Refills   losartan  (COZAAR ) 100 MG tablet [Pharmacy Med Name: LOSARTAN  POTASSIUM 100 MG TAB] 90 tablet 1    Sig: TAKE 1 TABLET BY MOUTH EVERY DAY     Cardiovascular:  Angiotensin Receptor Blockers Failed - 07/10/2024  2:43 PM      Failed - Cr in normal range and within 180 days    Creat  Date Value Ref Range Status  06/28/2024 1.16 (H) 0.50 - 1.05 mg/dL Final   Creatinine, Urine  Date Value Ref Range Status  12/24/2023 81 20 - 275 mg/dL Final         Failed - Last BP in normal range    BP Readings from Last 1 Encounters:  07/03/24 (!) 144/72         Passed - K in normal range and within 180 days    Potassium  Date Value Ref Range Status  06/28/2024 4.7 3.5 - 5.3 mmol/L Final         Passed - Patient is not pregnant      Passed - Valid encounter within last 6 months    Recent Outpatient Visits           1 week ago General medical exam   [place] [facility] Medicine Duanne Butler DASEN, MD   6 months ago Anemia, unspecified type   [facility] Medicine Duanne Butler DASEN, MD   1 year ago Type 2 diabetes mellitus with hyperlipidemia [facility])   [place] [facility] Medicine Duanne Butler DASEN, MD   1 year ago Type 2 diabetes mellitus with hyperlipidemia [facility])   [place] [facility] Family Medicine Duanne Butler DASEN, MD   1 year ago Type 2 diabetes mellitus with hyperlipidemia [facility])   [place] [facility] Family Medicine Pickard, Butler DASEN, MD

## 2024-07-11 ENCOUNTER — Telehealth: Payer: Self-pay

## 2024-07-11 ENCOUNTER — Other Ambulatory Visit: Payer: Self-pay

## 2024-07-11 DIAGNOSIS — E1169 Type 2 diabetes mellitus with other specified complication: Secondary | ICD-10-CM

## 2024-07-11 MED ORDER — METFORMIN HCL 1000 MG PO TABS: 1000.0000 mg | ORAL_TABLET | Freq: Two times a day (BID) | ORAL | 1 refills | Status: AC

## 2024-07-11 NOTE — Telephone Encounter (Signed)
 Prescription Request  07/11/2024  LOV: 07/03/24 CPE  What is the name of the medication or equipment? metFORMIN  (GLUCOPHAGE ) 1000 MG tablet [[id number]]   Have you contacted your pharmacy to request a refill? Yes   Which pharmacy would you like this sent to?  [id number] [place], [place] - [address] [place] [address] Phone: [phone] Fax: [phone]    Patient notified that their request is being sent to the clinical staff for review and that they should receive a response within 2 business days.   Please advise at [facility] [phone]

## 2024-12-26 ENCOUNTER — Other Ambulatory Visit

## 2025-01-01 ENCOUNTER — Ambulatory Visit: Admitting: Family Medicine

## 2025-07-02 ENCOUNTER — Other Ambulatory Visit

## 2025-07-06 ENCOUNTER — Encounter: Admitting: Family Medicine
